# Patient Record
Sex: Male | Born: 1950 | Race: White | Hispanic: No | Marital: Married | Smoking: Former smoker
Health system: Southern US, Community
[De-identification: ages and names within clinical notes are randomized; demographics above are authoritative.]

## PROBLEM LIST (undated history)

## (undated) DIAGNOSIS — I4892 Unspecified atrial flutter: Secondary | ICD-10-CM

## (undated) DIAGNOSIS — M353 Polymyalgia rheumatica: Secondary | ICD-10-CM

## (undated) DIAGNOSIS — J189 Pneumonia, unspecified organism: Secondary | ICD-10-CM

## (undated) DIAGNOSIS — I219 Acute myocardial infarction, unspecified: Secondary | ICD-10-CM

## (undated) DIAGNOSIS — E7800 Pure hypercholesterolemia, unspecified: Secondary | ICD-10-CM

## (undated) DIAGNOSIS — I2510 Atherosclerotic heart disease of native coronary artery without angina pectoris: Secondary | ICD-10-CM

## (undated) DIAGNOSIS — G4700 Insomnia, unspecified: Secondary | ICD-10-CM

## (undated) DIAGNOSIS — R1903 Right lower quadrant abdominal swelling, mass and lump: Secondary | ICD-10-CM

## (undated) DIAGNOSIS — Z720 Tobacco use: Secondary | ICD-10-CM

## (undated) DIAGNOSIS — B2710 Cytomegaloviral mononucleosis without complications: Secondary | ICD-10-CM

## (undated) DIAGNOSIS — I714 Abdominal aortic aneurysm, without rupture: Secondary | ICD-10-CM

## (undated) DIAGNOSIS — B259 Cytomegaloviral disease, unspecified: Secondary | ICD-10-CM

## (undated) DIAGNOSIS — I10 Essential (primary) hypertension: Secondary | ICD-10-CM

## (undated) HISTORY — DX: Insomnia, unspecified: G47.00

## (undated) HISTORY — DX: Cytomegaloviral disease, unspecified: B25.9

## (undated) HISTORY — PX: TRIGGER FINGER RELEASE: SHX641

## (undated) HISTORY — DX: Pure hypercholesterolemia, unspecified: E78.00

## (undated) HISTORY — DX: Tobacco use: Z72.0

## (undated) HISTORY — DX: Polymyalgia rheumatica: M35.3

## (undated) HISTORY — DX: Atherosclerotic heart disease of native coronary artery without angina pectoris: I25.10

---

## 1998-06-01 ENCOUNTER — Other Ambulatory Visit: Admission: RE | Admit: 1998-06-01 | Discharge: 1998-06-01 | Payer: Self-pay | Admitting: Urology

## 1999-03-05 DIAGNOSIS — I2510 Atherosclerotic heart disease of native coronary artery without angina pectoris: Secondary | ICD-10-CM

## 1999-03-05 HISTORY — DX: Atherosclerotic heart disease of native coronary artery without angina pectoris: I25.10

## 1999-03-15 ENCOUNTER — Encounter: Payer: Self-pay | Admitting: Emergency Medicine

## 1999-03-15 ENCOUNTER — Inpatient Hospital Stay (HOSPITAL_COMMUNITY): Admission: EM | Admit: 1999-03-15 | Discharge: 1999-03-23 | Payer: Self-pay | Admitting: Emergency Medicine

## 1999-03-16 ENCOUNTER — Encounter: Payer: Self-pay | Admitting: Cardiology

## 1999-03-16 HISTORY — PX: CARDIAC CATHETERIZATION: SHX172

## 1999-03-18 ENCOUNTER — Encounter: Payer: Self-pay | Admitting: Cardiology

## 1999-03-19 ENCOUNTER — Encounter: Payer: Self-pay | Admitting: Thoracic Surgery (Cardiothoracic Vascular Surgery)

## 1999-03-19 HISTORY — PX: CORONARY ARTERY BYPASS GRAFT: SHX141

## 1999-03-20 ENCOUNTER — Encounter: Payer: Self-pay | Admitting: Thoracic Surgery (Cardiothoracic Vascular Surgery)

## 1999-03-21 ENCOUNTER — Encounter: Payer: Self-pay | Admitting: Thoracic Surgery (Cardiothoracic Vascular Surgery)

## 1999-04-10 ENCOUNTER — Encounter (HOSPITAL_COMMUNITY): Admission: RE | Admit: 1999-04-10 | Discharge: 1999-07-09 | Payer: Self-pay | Admitting: Cardiology

## 1999-07-10 ENCOUNTER — Encounter (HOSPITAL_COMMUNITY): Admission: RE | Admit: 1999-07-10 | Discharge: 1999-10-08 | Payer: Self-pay | Admitting: Cardiology

## 1999-12-31 ENCOUNTER — Emergency Department (HOSPITAL_COMMUNITY): Admission: EM | Admit: 1999-12-31 | Discharge: 1999-12-31 | Payer: Self-pay | Admitting: Emergency Medicine

## 2000-10-14 ENCOUNTER — Encounter: Payer: Self-pay | Admitting: Cardiology

## 2000-10-14 ENCOUNTER — Ambulatory Visit (HOSPITAL_COMMUNITY): Admission: RE | Admit: 2000-10-14 | Discharge: 2000-10-14 | Payer: Self-pay | Admitting: Cardiology

## 2003-03-30 ENCOUNTER — Ambulatory Visit (HOSPITAL_BASED_OUTPATIENT_CLINIC_OR_DEPARTMENT_OTHER): Admission: RE | Admit: 2003-03-30 | Discharge: 2003-03-30 | Payer: Self-pay | Admitting: *Deleted

## 2003-03-30 ENCOUNTER — Ambulatory Visit (HOSPITAL_COMMUNITY): Admission: RE | Admit: 2003-03-30 | Discharge: 2003-03-30 | Payer: Self-pay | Admitting: *Deleted

## 2006-02-20 ENCOUNTER — Inpatient Hospital Stay (HOSPITAL_COMMUNITY): Admission: AD | Admit: 2006-02-20 | Discharge: 2006-02-28 | Payer: Self-pay | Admitting: Family Medicine

## 2007-12-07 ENCOUNTER — Ambulatory Visit: Payer: Self-pay | Admitting: Gastroenterology

## 2007-12-14 ENCOUNTER — Telehealth: Payer: Self-pay | Admitting: Gastroenterology

## 2007-12-18 ENCOUNTER — Ambulatory Visit: Payer: Self-pay | Admitting: Gastroenterology

## 2009-02-03 ENCOUNTER — Encounter: Admission: RE | Admit: 2009-02-03 | Discharge: 2009-02-03 | Payer: Self-pay | Admitting: Rheumatology

## 2010-03-17 ENCOUNTER — Ambulatory Visit (HOSPITAL_BASED_OUTPATIENT_CLINIC_OR_DEPARTMENT_OTHER)
Admission: RE | Admit: 2010-03-17 | Discharge: 2010-03-17 | Payer: Self-pay | Source: Home / Self Care | Attending: Family Medicine | Admitting: Family Medicine

## 2010-04-20 ENCOUNTER — Ambulatory Visit (INDEPENDENT_AMBULATORY_CARE_PROVIDER_SITE_OTHER): Payer: BC Managed Care – PPO | Admitting: Cardiology

## 2010-04-20 DIAGNOSIS — I2510 Atherosclerotic heart disease of native coronary artery without angina pectoris: Secondary | ICD-10-CM

## 2010-04-20 DIAGNOSIS — E7800 Pure hypercholesterolemia, unspecified: Secondary | ICD-10-CM

## 2010-05-01 ENCOUNTER — Telehealth (INDEPENDENT_AMBULATORY_CARE_PROVIDER_SITE_OTHER): Payer: Self-pay | Admitting: *Deleted

## 2010-05-02 ENCOUNTER — Encounter: Payer: Self-pay | Admitting: Cardiology

## 2010-05-02 ENCOUNTER — Ambulatory Visit (HOSPITAL_COMMUNITY): Payer: BC Managed Care – PPO | Attending: Cardiology

## 2010-05-02 DIAGNOSIS — I2510 Atherosclerotic heart disease of native coronary artery without angina pectoris: Secondary | ICD-10-CM

## 2010-05-02 DIAGNOSIS — Z951 Presence of aortocoronary bypass graft: Secondary | ICD-10-CM | POA: Insufficient documentation

## 2010-05-02 DIAGNOSIS — R0609 Other forms of dyspnea: Secondary | ICD-10-CM

## 2010-05-02 DIAGNOSIS — I4949 Other premature depolarization: Secondary | ICD-10-CM

## 2010-05-02 DIAGNOSIS — Z0389 Encounter for observation for other suspected diseases and conditions ruled out: Secondary | ICD-10-CM | POA: Insufficient documentation

## 2010-05-10 NOTE — Progress Notes (Signed)
Summary: Nuclear Pre-Procedure  Phone Note Outgoing Call   Call placed by: Allen Kell Summary of Call: Reviewed information on [employer] Information Sheet (see scanned document for further details).  Spoke with patient.     Nuclear Med Background Indications for Stress Test: Evaluation for Ischemia, Graft Patency   History: CABG  History Comments: '01 CABG x5     Nuclear Pre-Procedure Cardiac Risk Factors: Lipids  Nuclear Med Study Referring MD:  P.Jordan

## 2010-05-10 NOTE — Assessment & Plan Note (Signed)
Summary: Cardiology Nuclear Testing  Nuclear Med Background Indications for Stress Test: Evaluation for Ischemia, Graft Patency   History: CABG  History Comments: '01 Cath> CABG x5. '08 [id number] reversible defect inferior wall with scar, mild ischemia,EF=64%.  Symptoms: Chest Tightness, Rapid HR  Symptoms Comments: Last couple weeks ago.   Nuclear Pre-Procedure Cardiac Risk Factors: History of Smoking, Lipids Caffeine/Decaff Intake: None NPO After: 8:00 PM Lungs: Clear IV 0.9% NS with Angio Cath: 18g     IV Site: R Antecubital IV Started by: Stanton Kidney, EMT-P Chest Size (in) 48     Height (in): 71 Weight (lb): 231 BMI: 32.33  Nuclear Med Study 1 or 2 day study:  1 day     Stress Test Type:  Stress Reading MD:  Marca Ancona, MD     Referring MD:  P.Jordan Resting Radionuclide:  Technetium 85m Tetrofosmin     Resting Radionuclide Dose:  10.0 mCi  Stress Radionuclide:  Technetium 91m Tetrofosmin     Stress Radionuclide Dose:  33.0 mCi   Stress Protocol Exercise Time (min):  10:46 min     Max HR:  144 bpm     Predicted Max HR:  159 bpm  Max Systolic BP: 196 mm Hg     Percent Max HR:  90.57 %     METS: 12.3 Rate Pressure Product:  81191    Stress Test Technologist:  Irean Hong,  RN     Nuclear Technologist:  Domenic Polite, CNMT  Rest Procedure  Myocardial perfusion imaging was performed at rest 45 minutes following the intravenous administration of Technetium 27m Tetrofosmin.  Stress Procedure  The patient exercised for 10 minutes and 46 seconds,RPE=17.   The patient stopped due to DOE, bilateral thigh tightness 7/10,  and denied any chest pain.  There were  significant ST-T wave changes, occ. PVC's/PAC's. Technetium 8m Tetrofosmin was injected at peak exercise and myocardial perfusion imaging was performed after a brief delay.  QPS Raw Data Images:  Normal; no motion artifact; normal heart/lung ratio. Stress Images:  Basal to mid inferior perfusion defect.    Rest Images:  Basal to mid inferior perfusion defect, less marked than stress Subtraction (SDS):  Partially reversible basal to mid inferior perfusion defect.  Transient Ischemic Dilatation:  1.0  (Normal <1.22)  Lung/Heart Ratio:  0.36  (Normal <0.45)  Quantitative Gated Spect Images QGS EDV:  115 ml QGS ESV:  45 ml QGS EF:  61 % QGS cine images:  Normal wall motion.    Overall Impression  Exercise Capacity: Good exercise capacity. BP Response: Normal blood pressure response. Clinical Symptoms: Shortness of breath.  ECG Impression: 1 mm ST depression inferiorly and in V4-V6 at peak exercise Overall Impression: Partially reversible basal to mid inferior perfusion defect suggests infarction with peri-infarct ischemia.  Ischemic ECG response.   Appended Document: Cardiology Nuclear Testing COPY SENT DR. Sw[place]iland

## 2010-07-20 NOTE — Discharge Summary (Signed)
[name], [name]              ACCOUNT NO.:  [id number]   MEDICAL RECORD NO.:  [id number]          PATIENT TYPE:  INP   LOCATION:  3021                         FACILITY:  [facility]   PHYSICIAN:  Corinna L. Lendell Caprice, MDDATE OF BIRTH:  01-Feb-1951   DATE OF ADMISSION:  02/20/2006  DATE OF DISCHARGE:  02/28/2006                               DISCHARGE SUMMARY   DISCHARGE DIAGNOSES:  1. 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  2. Coronary artery disease with a history of coronary artery bypass      graft.  3. Hyperlipidemia.  4. Hypertension.   DISCHARGE MEDICATIONS:  He is to take:  1. Prednisone 30 mg p.o. daily until seen by rheumatology.  2. Voltaren 50 mg p.o. b.i.d. as needed for pain.  He may continue his outpatient medications which include:  1. Lisinopril 40 mg a day.  2. Aspirin 325 mg a day.  3. Multivitamin a day.   CONDITION:  Stable.   ACTIVITY:  Ad lib.   DIET:  Cardiac.   CONSULTATIONS:  None.   PROCEDURES:  None.   PERTINENT LABORATORIES:  White blood cell count on admission was 12.  The rest of his CBC was unremarkable at discharge is 8.5.  Complete  metabolic panel significant for a sodium of 133, glucose 127, albumin  3.2, total protein 5.9, otherwise unremarkable.  Total CPK was normal.  TSH 1.5.  C-reactive protein slightly elevated at 3.6.  UA:  Negative  nitrite, negative leukocyte esterase, negative blood, negative protein.  Urine culture:  Negative.  Rheumatoid factor less than 20.  Lyme  antibody is negative.  Aldolase normal at 5.3.  ANA negative.   SPECIAL STUDIES IN RADIOLOGY:  Doppler of the left lower extremity  showed no DVT.  Chest X-Ray: Showed left basilar subsegmental  atelectasis or scar.  Bilateral hip films and pelvis were negative.  Shoulder x-ray was negative.  MRI of the brain negative.  MRI of the C-  spine showed no cord compression.  EKG showed normal sinus rhythm.   HISTORY AND HOSPITAL COURSE:  Mr.  William [name] is a 60 year old white male  patient of Dr. Marinda Elk, who was directly admitted with a several  month history of worsening shoulder and hip/groin pain,.  He has a  history of hyperlipidemia and had been on Zetia and Zocor.  These were  discontinued several months ago when he started having myalgias but his  symptoms continued to worsen and the pain started in his shoulders and  axilla and some spread eventually down to his hips and groin.  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  He also had difficulty driving and walking.  He had been on Vicodin and  ibuprofen without relief.  As he was having difficulties with activities  of daily living, he was admitted for further workup, treatment, and  physical therapy.  There is no family history of any rheumatologic  disorders.  He had no weakness on exam, but he did have limited range of  motion of his shoulders due  to pain and also had pain with external  rotation of the hips.  He had essentially a negative workup except for a  slightly elevated white count and C-reactive protein.  His erythrocyte  sedimentation rate, I failed to mention above, was normal at 13.   The patient was started briefly on doxycycline as he had reported going  can be recently.  He denied any rash, however.  When his Lyme titers  came back, this was stopped.  He was requiring quite a bit of IV  Dilaudid.  After discussing the case with Dr. Estill Bakes, the patient was  started on prednisone 30 mg as recommended by Dr. Phylliss Bob.  Dr. Phylliss Bob also  recommended an MRI of the C-spine and head which was negative.  The  patient has a normal erythrocyte sedimentation rate but this could  possibly be polymyalgia rheumatic, according to Dr. Phylliss Bob.   The patient was completely pain free within 24 hours of starting the  steroids, also Voltaren was started at the same time.  However, the  patient had been on high doses of ibuprofen as an  outpatient without  relief, so I suspect the prednisone to have relieved his pain more than  the nonsteroidal anti-inflammatories.   The patient is being discharged to home in stable condition.   He has an outpatient follow-up appointment with Dr. Foy Guadalajara and I have  asked him to follow up with rheumatology first available appointment      Corinna L. Lendell Caprice, MD  Electronically Signed     CLS/MEDQ  D:  02/28/2006  T:  02/28/2006  Job:  [id number]   cc:   Molly Maduro L. Foy Guadalajara, M.D.  Areatha Keas, M.D.

## 2010-07-20 NOTE — Discharge Summary (Signed)
[place]. [facility]  Patient:    William [name]                        MRN: [id number] Adm. Date:  [id number] Disc. Date: 03/23/99 Attending:  Charlett Lango Dictator:   Eugenia Pancoast, P.A. CC:         Salvatore Decent. Dorris Fetch, M.D.             Peter M. [place], M.D.             Marinda Elk, M.D., [facility]                           Discharge Summary  DATE OF BIRTH:  1950-03-09  FINAL DIAGNOSES: 1. Coronary artery disease. 2. Hypertension. 3. Hypercholesterolemia.  PROCEDURES: 1. March 16, 1999, left heart catheterization by Dr. Peter Swaziland. 2. March 19, 1999, coronary artery bypass graft of LIMA to the LAD,    RIMA to OM-2, sequential saphenous vein graft to OM-1 and OM-3, and    saphenous vein graft to intermediate, surgeon was Dr. Dorris Fetch.  HISTORY:  This is a 60 year old gentleman with a history of borderline hypertension, hypercholesterolemia, and tobacco abuse who presents for evaluation and testing.  On the night of admission, March 15, 1999, he was getting ready for bed when he developed substernal chest pain.  There was no radiation of the pain. There was increasing intensity to 8/10 associated with some sweating but no shortness of breath.  He took Rolaids without relief.  After approximately one nd one-half hours, he presented to the emergency room, and his pain had abated to approximately 1 to 2/10.  He took nitroglycerin but states he really did not notice any difference with this.  He had no prior history of chest pain, no history of  myocardial infarction or congestive heart failure.  Because of these symptoms, though, he was admitted to Dr. Arther Abbott service for further evaluation.  HOSPITAL COURSE:  The patient was admitted March 15, 1999, and cardiac enzymes were elevated.  The patient was started on Lovenox per pharmacy protocol.  He continued on that until March 16, 1999, when he underwent his  left heart catheterization per Dr. Peter Swaziland.  Dr. Swaziland performed a catheterization which the patient tolerated satisfactorily.  He was noted to have severe three-vessel  coronary artery disease and subsequently was referred to Dr. Charlett Lango for surgical revascularization.  Dr. Dorris Fetch saw the patient and spoke with him and discussed the surgery.  Risks and benefits were explained.  The patient understood and agreed to surgery.  On March 19, 1999, the patient underwent coronary artery bypass x 5 with LIMA to the LAD, RIMA to the second obtuse marginal, saphenous ein graft to the first and third obtuse marginals, and saphenous vein graft to the intermediate artery.  The patient tolerated the procedure well, and no intraoperative complications occurred.  Postoperatively, the patient has done quite well.  He was extubated on the operative day.  He progressed in a satisfactory manner.  He was afebrile. Vital signs were all stable.  He was transferred to a stepdown unit on the first postoperative day.  There, he continued to progress satisfactorily.  He went through his cardiac rehabilitation phase 1 without difficulty.  He continued to  progress in a satisfactory manner.  No untoward events occurred during his stay. He continue to progress well.  His diet  was advanced.  He was tolerating his diet satisfactorily with not complaints there.  He did so well that by the third postoperative day, he was prepared for discharge.  Final laboratory work showed sodium 139, potassium 4.4, chloride 105, CO2 32, BUN 19, creatinine 1.0, glucose 116, calcium 8.3.  White blood cell count 9.9, hemoglobin 9.1, hematocrit 25.9, platelets 172,000.  The patient was doing well as noted.  His incisions were all healing satisfactorily.  No sign of infection occurred at this time.  He did have some difficulty with his O2 saturations requiring O2 longer than normal.  But, by the third  postoperative day, he was in his 90s without O2 support.  DISCHARGE MEDICATIONS: 1. Zocor 20 mg q.d. 2. Lasix 40 mg q.d. x 7 days. 3. K-Dur 20 mEq q.d. x 7 days. 4. Enteric-coated aspiring 325 mg q.d. 5. Lopressor 50 mg 1/2 tablet q.12h. 6. Tylox 1 to 2 p.o. q.4-6h. p.r.n. pain.  FOLLOWUP:  The patient will follow up with Dr. Peter Swaziland in two weeks.  At that time, he should also get a chest x-ray.  He will come to Dr. Rondel Jumbo office on Wednesday, February 7 at 10 a.m. for followup.  At that time, he will bring he x-ray with him.  DISPOSITION:  The patient is discharged home in satisfactory and stable condition on March 23, 1999.  DIET:  Low-fat, heart smart diet.  ACTIVITY:   He is advised not to drive.  No heavy lifting for the next three weeks, and walk daily. DD:  03/22/99 TD:  03/23/99 Job: [id number]

## 2010-07-20 NOTE — Cardiovascular Report (Signed)
[place]. [facility]  Patient:    William [name]                        MRN: [id number] Proc. Date: 03/16/99 Adm. Date:  [id number] Attending:  [place], Peter Manning CC:         Marinda Elk, M.D., [facility] B. Tyrone Sage, M.D.                        Cardiac Catheterization  INDICATIONS FOR PROCEDURE:  The patient is a 60 year old white male who presented with unstable angina pectoris.  The patient had a marked ischemic response on exercise stress testing with ST elevation in the anterior septal leads.  The patient has a history of tobacco abuse and hypercholesterolemia.   PROCEDURES:  Left heart catheterization with coronary angiography.  ACCESS:  Via the right femoral artery using the standard Seldinger technique.  EQUIPMENT:  6 French 4 cm right and left Judkins catheter, 6 French pigtail catheter, 6 French arterial sheath.  MEDICATIONS:  Omnipaque 120 cc.  HEMODYNAMIC DATA:  Aortic pressure is 107/69 with a mean of 86 mmHg.  Left ventricular pressure is 138 with an EDP of 16 mmHg.  COMMENTARY:  After the patients cardiac catheterization, he developed diffuse hives and itching.  He was treated with IV Benadryl and Solu-Medrol. Hemodynamically he remained stable.  ANGIOGRAPHIC DATA:  Left coronary artery:  The left coronary artery arises and distributes normally.  Left main:  The left main coronary artery is normal.  Left anterior descending:  The left anterior descending artery is a large vessel which wraps around the apex.  It is diffusely diseased.  There is segmental 50%  stenosis proximally followed by segmental 50% disease in the mid vessel.  In the distal vessel there is a more focal 60-70% stenosis.  Left circumflex:  The left circumflex coronary artery arises and distributes normally.  There are two large obtuse marginal vessels which arise from the central trunk.  This trunk has severe disease up  to 80-90%.  This involves bifurcation f two marginal vessels.  Right coronary artery:  The right coronary artery arises normally.  There is a 9% focal stenosis in the proximal vessel.  The right coronary artery is then occluded in the mid vessel.  There are some bridging right to right collaterals and left to right collaterals as well.  However, there appears to be a large skip area between the occlusion and the collaterals filling in.  LEFT VENTRICULAR ANGIOGRAPHY:  The left ventricular angiography demonstrates normal left ventricular size and contractility with normal systolic function. Ejection fraction is calculated at 65%.  There is no mitral regurgitation or prolapse.  FINAL INTERPRETATION: 1. Three-vessel obstructive atherosclerotic coronary artery disease. 2. Normal left ventricular function.  PLAN:  Based on this patients anatomy, he is poorly suited for catheter base intervention.  Would recommend coronary artery bypass surgery. DD:  03/16/99 TD:  03/17/99 Job: [id number]

## 2010-07-20 NOTE — Op Note (Signed)
William [name], William [name]                          ACCOUNT NO.:  [id number]   MEDICAL RECORD NO.:  [id number]                   PATIENT TYPE:  AMB   LOCATION:  DSC                                  FACILITY:  [facility]   PHYSICIAN:  Lowell Bouton, M.D.      DATE OF BIRTH:  24-Feb-1951   DATE OF PROCEDURE:  03/30/2003  DATE OF DISCHARGE:                                 OPERATIVE REPORT   PREOPERATIVE DIAGNOSIS:  Right ring trigger finger.   POSTOPERATIVE DIAGNOSIS:  Right ring trigger finger.   OPERATION PERFORMED:  Release of A-1 pulley, right ring finger.   SURGEON:  Lowell Bouton, M.D.   ANESTHESIA:  0.5% Marcaine local with sedation.   OPERATIVE FINDINGS:  The patient had a volar retinacular ganglion attached  to the A-1 pulley.  There was thickening of the tendon beneath the pulley.   DESCRIPTION OF PROCEDURE:  Under 0.5% Marcaine local anesthesia with the  tourniquet on the right arm, the right hand was prepped and draped in the  usual fashion.  After exsanguinating the limb, the tourniquet was inflated  to 250 mmHg.  A V-shaped incision was made, crossing the distal palmar  crease in line with the ring finger.  Sharp dissection was carried through  the subcutaneous tissues.  Blunt dissection was carried down to the A-1  pulley and there was a ganglion attached to it that was excised.  The A-1  pulley was then incised and the finger was placed through full range of  motion.  There was enlargement of the flexor tendon beneath the A-1 pulley.  There was no further triggering after release of the pulley.  The wound was  then irrigated.  The skin was closed with 4-0 nylon suture.  Sterile  dressings were applied.  The patient tolerated the procedure well and went  to the recovery room awake and stable in  good condition.                                               Lowell Bouton, M.D.    EMM/MEDQ  D:  03/31/2003  T:  03/31/2003  Job:  [id number]

## 2010-07-20 NOTE — H&P (Signed)
[place]. [facility]  Patient:    William [name]                        MRN: [id number] Adm. Date:  [id number] Attending:  [place], Peter Manning Dictator:   Peter M. [place]il[place], M.D.                         History and Physical  CHIEF COMPLAINT:  Chest pain.  HISTORY OF PRESENT ILLNESS:  William [name] is a 60 year old white male with a history of borderline hypertension, hypercholesterolemia, and tobacco abuse, who presents for evaluation of chest pain.  Tonight the patient was getting ready for bed when he developed substernal chest pain in his mid chest.  There was no radiation of  pain.  It increased in intensity to eight out of ten and was associated with some sweating, but no shortness of breath.  He took Rolaids without relief and after  approximately 1-1/2 hours he presented to the emergency room, and his pain has ow abated to a one to two out of ten.  He took a nitroglycerin but states he really did not notice any difference with this.  He has had no prior history of chest pain, no history of myocardial infarction or congestive heart failure.  PAST MEDICAL HISTORY:  Includes history of borderline hypertension, which he relates to overeating, and has been controlled with diet.  He has a history of hypercholesterolemia in the 260s.  He is status post knee surgery for a torn meniscus.  He is status post hand surgery for trigger finger on the left.  He had evaluation one year ago for hematuria which was negative.  ALLERGIES:  No known drug allergies.  MEDICATIONS:  P.r.n. Motrin.  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  His wife is a Orthoptist.  He has smoked what he reports as less than one pack per day and quit March 05, 1999.  He denies any history of alcohol use.  FAMILY HISTORY:  Father died at age 43 with congestive heart failure.   Mother is age 69, living with him, and has had a previous CVA and hypertension.  He has one brother who has hypertension and sleep apnea.  REVIEW OF SYSTEMS:  No known history of peptic ulcer disease or acid reflux. No history of arthritis.  No history of CVA or diabetes.  PHYSICAL EXAMINATION:  GENERAL:  The patient is a well-developed white male in no apparent distress.  VITAL SIGNS:  Blood pressure 124/76, pulse 60 and regular, temperature 99 degrees, respirations 20.  HEENT:  PERRLA.  Left conjunctiva slightly injected.  Funduscopic exam benign.  Oropharynx clear.  NECK:  Supple without JVD, adenopathy, thyromegaly, or bruits.  LUNGS:  Clear to auscultation and percussion.  CARDIAC:  Regular rate and rhythm; normal S1 and S2 without murmurs, rubs, gallops, or clicks.  No chest wall tenderness to palpation.  ABDOMEN:  Soft, nontender, without masses or hepatosplenomegaly.  EXTREMITIES:  Pulses 2+ and symmetric.  No edema.  NEUROLOGIC:  Intact.  LABORATORY DATA:  CBC normal.  ECG shows normal sinus rhythm, normal ECG. Chest x-ray shows no active disease.  Other labs are pending.  IMPRESSION: 1.  Chest pain consistent with unstable angina pectoris.  Possible GI source of     pain.  2.  Hypercholesterolemia. 3.  Tobacco abuse. 4.  History of borderline hypertension, diet controlled.  PLAN:  The patient is being admitted to telemetry for rule out myocardial infarction, serial cardiac enzymes and ECG.  Will treat with aspirin, subQ Lovenox, and IV nitroglycerin.  Beta-blocker will be held due to the patients slow pulse  rate.  If his cardiac enzymes and ECG remain normal will consider stress Cardiolite evaluation tomorrow.  Will also treat with empiric Prevacid therapy. DD:  03/16/99 TD:  03/16/99 Job: [id number]

## 2010-07-20 NOTE — Op Note (Signed)
[facility]  Patient:    William [name]                        MRN: [id number] Proc. Date: 03/19/99 Adm. Date:  [id number] Attending:  Charlett Lango CC:         Peter M. [place], M.D.                           Operative Report  PREOPERATIVE DIAGNOSIS:  Unstable angina and three vessel coronary disease.  POSTOPERATIVE DIAGNOSIS:  Unstable angina and three vessel coronary disease.  PROCEDURE:  Mediasternotomy, extracorporeal circulation coronary artery bypass grafting x 5 (left internal mammary to left anterior descending, free right internal mammary artery to obtuse marginal 2, sequential saphenous vein graft to obtuse marginal 1 and obtuse marginal 3, saphenous vein graft to acute marginal).  SURGEON:  Salvatore Decent. Dorris Fetch, M.D.  ASSISTANT:  Sherrie George, P.A.  ANESTHESIA:  General.  FINDINGS:  OM2 originated epicardial, crossed under OM3, took anterolateral course intramyocardially and grafted in intramyocardial site.  Acute marginal and LAD  fair quality targets; remaining vessels - good quality targets, good quality conduits.  Aorta fragile.  CLINICAL NOTE:  Mr. Era Skeen is a 60 year old gentleman who presented with new onset unstable angina; enzymes were drawn and CK MBs were consistent with a small nonQ-wave MI.  Troponins were negative.  The patient underwent coronary artery catheterization, which revealed severe three-vessel coronary disease with total occlusion of the right coronary.  The patient was referred for coronary artery bypass grafting.  The indications, risks and benefits of the procedure, as well as alternative treatments - were discussed in detail with the patient and his family; they understood the risks and benefits and agreed to proceed.  OPERATIVE NOTE:  Mr. Warmuth was brought to the preop holding area on 03/19/99.  Lines were placed to monitor arterial, central venous and pulmonary  arterial pressure.  EKG leads were placed for continuous telemetry.  The patient was taken to the operating room, anesthetized and intubated.  A Foley catheter was placed, intravenous antibiotics were administered and the chest, abdomen and legs were prepped and draped in the usual fashion.  An median sternotomy was performed and the left internal mammary artery was harvested in the standard fashion.  Simultaneously, an incision was made in the  medial aspect of the right leg, at the ankle and the greater saphenous vein was  harvested from the ankle to the upper calf.  The saphenous vein was of good quality as was the left internal mammary artery.  The patient was given 5000 units heparin prior to finding the distal end of the mammary artery.  There was good flow through the cut into the vessel; the mammary was placed in a papaverine-soaked sponge. All branches were clipped.  The mammary was then placed into the left pleural space. The right internal mammary artery then was harvested in the same fashion.  The patient was given the remainder of the full heparinization dose prior to dividing the distal end of the right mammary artery.  Again, there was good flow through the cut into this vessel, and the mammary was placed in a papaverine-soaked sponge nd placed into the right pleural space.  The pericardium was opened and the ascending aorta was palpated.  The aorta was  without palpable atherosclerotic disease.  The aorta was cannulated via concentric 2-Ethibond nonpledgeted pursestring sutures.  A dual-stage  venous cannula was placed via pursestring suture in the right atrial appendage.  Cardiopulmonary bypass was instituted and the patient was cooled to 32 degrees Celsius.  The coronary arteries were inspected and anastomotic sites chosen.  The conduits were inspected and cut to length.  A foam pad was placed in the pericardium to protect the left phrenic nerve.  A  temperature probe was placed in the myocardial septum and a cardioplegic cannula was placed in the ascending aorta.  Of note:  The acute marginal was a graftable vessel off the right coronary system.  There was a very small posterior descending, which was diffusely diseased and nongraftable.  The aorta was crossclamped.  The left ventricle was emptied ______ aortic root.  Cardiac arrest was achieved with a combination of cold antegrade cardioplegia and topical iced saline.  After achieving a complete diastolic arrest, the myocardial went to temperature of 11 degrees Celsius. The following distal anastomoses were performed.  First, a reverse saphenous vein graft was placed end-to-side to the acute marginal branch of the right coronary artery; this was a 1.5 mm fair quality target. There was atherosclerotic disease just proximal to the anastomosis.  The vessel bifurcated to a 1 mm and 1.5 mm branch just after the anastomosis.  The anastomosis was performed with a running 7-0 Prolene suture, proximally and distally, prior to tying the suture.  There was good flow through the graft.  Cardioplegia was administered and there were no leaks.  Next, a reverse saphenous vein graft was placed sequentially to OM1 and OM3; the first obtuse marginal branch came off very high in distribution of a ramus intermedius.  This was a 1.5 mm good quality vessel that had 80% proximal blockage. The anastomosis was performed side-to-side off a side branch of the vein graft,  using a running 7-0 Prolene suture.  An end-to-side anastomosis then was constructed to the third obtuse marginal branch, which is a 1.5 mm good quality  vessel.  Both anastomoses were probed proximally and distally with 1.5 mm probes prior to tying the sutures.  There was good flow through the graft.  Additional  cardioplegia was administered down the vein graft and there was good hemostasis at each of the  anastomoses.  Next, the free right internal mammary graft was spatulated on its distal end; it was a 1.5 mm vessel - at that side, was 2 mm proximally.  It was anastomosed end-to-side to the second obtuse marginal branch of the left circumflex coronary  artery.  This vessel actually came off epicardial than dove intramyocardial to traverse underneath the OM3 and came out on a more anterolateral course and traversed all the way to the apex.  It was grafted to the intramyocardial site nd was a 2 mm good quality vessel and had 80% stenosis at it origin.  The free right internal mammary artery was anastomosed end-to-side with a running 8-0 Prolene suture and the anastomosis was probed proximally and distally with a 1.5 mm probe, which passed easily, before tying the suture.  Additional cardioplegia was administered; there was back-bleeding of cardioplegia from the second obtuse marginal, free RIMA graft. A soft bulldog clamp was placed to cross.  The left internal mammary artery then was brought through a window in the pericardium anterior to the left phrenic nerve; it was anastomosed end-to-side to the distal LAD.  The distal LAD was a 2 mm fair quality vessel; there was atherosclerosis but not proximal and distal to the site of the anastomosis that  was a diffusely diseased vessel, although a 1.5 mm probe passed easily proximally and distally.  The anastomosis was performed with a running 8-0 Prolene suture.  At  completion of the mammary to LAD anastomosis, the bulldog clamps were removed from the mammary artery and immediate and rapid septal rewarming was noted. Lidocaine was administered and the mammary pedicle was tacked to the epicardial surface of the heart with interrupted 6-0 Prolene sutures.  The aortic crossclamp was removed and total crossclamp time was 57 minutes.  A total of three defibrillations with 20 joules was required before the patient  resumed sinus  rhythm.  He then had no further arrhythmias.  A partial occlusion  clamp was placed on the ascending aorta.  The cardioplegia cannula was removed.  The proximal vein grafts/anastomoses were performed to 4.0 mm punch aortotomies  with running 6-0 Prolene sutures.  A venotomy then was made in the hood of the sequential obtuse marginal vein graft at the site of aortotomy and the free right internal mammary artery proximal anastomosis was performed to this venotomy with a running 7-0 Prolene suture.  The bulldog clamp was removed from the mammary artery.  There was back-bleeding into the anastomosis, which was used to de-air prior to  removing the partial occlusion clamp.  A suture then was tied and air was aspirated from the vein grafts.  All bulldog clamps were removed and flow was restored. ll proximal and distal anastomoses were inspected for hemostasis.  There was significant bleeding from the ascending aorta, from the site of sutures pulling  through relatively fragile arterial wall.  At one site there was a linear tear sufficient to require a 4-0 pledgeted suture to control the bleeding. Epicardial pacing wires were placed on the right ventricle and right atrium.  The patient as rewarmed.  The patient was weaned from cardiopulmonary bypass.  When the core temperature reached 37 degrees Celsius, the patient in sinus rhythm, but was atrially paced for rate at the time of separation from bypass.  He was on no inotropic support until bypass time was 131 minutes.  The patient remained hemo- dynamically stable throughout the post-bypass course.  A test dose of protamine was administered and was well-tolerated.  The atrial and aortic cannula were removed.  The remainder of the protamine was administered. A 3-0 Prolene pledget horizontal mattress suture was required to reinforce the aortic cannulation site; there was good hemostasis.  A 3-0 non-pledgeted Prolene suture was used  to reinforce the atrial cannulation site.  Hemostasis was achieved, and the chest was irrigated with one L of warm normal saline containing 1 gram of vancomycin.  The pericardium cannot be reapproximated, but the mediastinal fat as brought together over the heart and vein grafts, and ascending aorta to prevent  adherence to the sternum.  Bilateral pleural chest tubes were placed as well as two mediastinal chest tubes.  The sternum was closed with stainless steel wires. The pectoralis fascia was closed with running #1 Vicryl suture.  The subcutaneous tissue in both wounds was closed with a 2-0 Vicryl running suture and the skin n both wounds was closed with a running 3-0 Vicryl subcuticular suture.  All sponge, needle and instrument counts were correct at the end of the procedure. The patient remained hemodynamically stable and was taken from the operating room to the surgical intensive care unit. DD:  03/19/99 TD:  03/19/99 Job: [id number]

## 2010-07-20 NOTE — H&P (Signed)
William [name], William [name]              ACCOUNT NO.:  [id number]   MEDICAL RECORD NO.:  [id number]          PATIENT TYPE:  INP   LOCATION:  3021                         FACILITY:  [facility]   PHYSICIAN:  Kela Millin, M.D.DATE OF BIRTH:  01-13-1951   DATE OF ADMISSION:  02/20/2006  DATE OF DISCHARGE:                              HISTORY & PHYSICAL   CHIEF COMPLAINT:  Worsening arthralgias and myalgias.   HISTORY OF PRESENT ILLNESS:  The patient is a 60 year old white male  with past medical history significant for hyperlipidemia, hypertension  and coronary artery disease.  He presents with above complaints.  He  states that he was on Zetia on Zocor for hyperlipidemia, and 2 months  ago he began having myalgias, shoulder pain and also pain in his groins.  He saw his family care physician and the Zocor and subsequently Zetia  were discontinued in November.  (The patient states that he has had some  myalgias in the past while on statins, and after discontinuation of the  medications the acute symptoms resolved).  The patient states that in  the 3 weeks since discontinuation of the entire lipid medications, his  myalgias have continued to worsen; the shoulder joint pain bilaterally  as well as the groin pain bilaterally have continued to worsen.  He  states that the pain worsens with any movement if he tries to lift up  his arms, or trying to walk or even turning in bed.  The patient also  reports that he is still able to comb his hair, but with difficulty.  He  is not able to do his other activities of daily living, and his wife has  to help him to dress up.  This has gotten to where he has difficulty  walking, and by his wife's demonstration his gait is antalgic.   The patient was seen at his PCP's office today, and after doing some lab  work he was started on some Prevacid -- which he states give him the  most relief of any of the medications he has tried.  He is admitted to  the [facility] for further evaluation and management.   The patient feels that he had been on Vicodin, also tried ibuprofen with  no relief.  He has also not been able to sleep at night, secondary to  the pain.  The patient denies chest pain, cough, fevers, shortness of  breath, dysuria, diarrhea, melena, and no hematochezia.  He states that  several weeks ago he did have some cough symptoms, but those have since  resolved.   PAST MEDICAL HISTORY:  1. As stated above.  2. Coronary artery disease, status post coronary artery bypass      grafting x5 vessels.   MEDICATIONS:  1. Lisinopril 40 mg daily.  2. Aspirin 325 mg.  3. Silver Centrum.   ALLERGIES:  IV DYE.   SOCIAL HISTORY:  She quit tobacco in 2001, occasional beer.   FAMILY HISTORY:  His father had bladder cancer.  His mother died of  metastatic cancer.  He denies any family history of connective  tissue  diseases.   REVIEW OF SYSTEMS:  As the HPI -- Other review of systems negative.   PHYSICAL EXAMINATION:  GENERAL:  The patient is a middle-aged white  male. He is alert and oriented x3, in no respiratory distress.  VITAL SIGNS:  Temperature 97.2 with a pulse of 69. respiratory rate 22,  blood pressure 113/70, O2 saturations 96%.  HEENT:  PERRL.  EOMI.  Sclerae anicteric.  Moist mucous membranes.  NECK:  Supple.  No adenopathy.  No thyromegaly.  No JVD.  LUNGS:  Sounds clear to auscultation bilaterally.  No crackles and no  wheezes.  CARDIOVASCULAR: Regular rate and rhythm.  Normal S1, S2.  ABDOMEN:  Obese.  Bowel sounds present, nontender and nondistended.  No  organomegaly and no masses palpable.  EXTREMITIES:  No cyanosis and no edema.  No synovitis, and no findings  consistent with joint effusions noted.  The range of movement in his  shoulders is limited by pain.  Also the range of motion in his hips is  limited by pain.  NEUROLOGIC:  He is alert and oriented x3.  Saline 2212 grossly intact.  Gait is not  assessed secondary to pain.  His strength is 4-5/5 and  symmetric.  Sensory is grossly intact.   LABORATORY DATA:  Lab work pending.   ASSESSMENT AND PLAN:  PROBLEM #1 - ARTHRALGIAS/MYALGIAS:  I have  discussed about being progressive over the past 2 months, even off  statins.  Will obtain a sed rate rheumatoid factor.  CPK, TSH,  Aldolase, LFTs.  No analgesics for pain management.  Followup.   PROBLEM #2 - CONTINUE LISINOPRIL.   PROBLEM #3 - CORONARY ARTERY DISEASE/RECENT CHEST PAIN-FREE:  Obtained  on EKG for a CPK as above.      Kela Millin, M.D.  Electronically Signed     ACV/MEDQ  D:  02/20/2006  T:  02/20/2006  Job:  [id number]

## 2011-02-28 ENCOUNTER — Telehealth: Payer: Self-pay | Admitting: Cardiology

## 2011-02-28 MED ORDER — NITROGLYCERIN 0.4 MG SL SUBL: 0.4000 mg | SUBLINGUAL_TABLET | SUBLINGUAL | Status: DC | PRN

## 2011-02-28 NOTE — Telephone Encounter (Signed)
Called stating he had episode during the night last night of chest discomfort lasting few minutes. Took 2 NTG's but they were half tablets and was relieved. Feels fine today. Has been eating a lot over the holidays and thinks could be indigestion. His NTG expiration date is 4/12. Advised will send new RX in and if has chest pain or discomfort again needs to call us or go to ER

## 2011-02-28 NOTE — Telephone Encounter (Signed)
New Problem:     Patient called feeling uncomfortable, has some chest pain, and he took some quick nitro and everything subsided and everything is fine now. He just wanted to let you know. Please call back if necessary.

## 2011-03-29 ENCOUNTER — Encounter: Payer: Self-pay | Admitting: Cardiology

## 2011-06-10 DIAGNOSIS — M353 Polymyalgia rheumatica: Secondary | ICD-10-CM | POA: Diagnosis present

## 2011-06-10 DIAGNOSIS — R3129 Other microscopic hematuria: Secondary | ICD-10-CM | POA: Insufficient documentation

## 2011-06-10 DIAGNOSIS — I10 Essential (primary) hypertension: Secondary | ICD-10-CM | POA: Insufficient documentation

## 2011-06-10 DIAGNOSIS — E785 Hyperlipidemia, unspecified: Secondary | ICD-10-CM | POA: Diagnosis present

## 2011-06-10 DIAGNOSIS — K819 Cholecystitis, unspecified: Secondary | ICD-10-CM | POA: Insufficient documentation

## 2012-05-03 ENCOUNTER — Emergency Department (HOSPITAL_COMMUNITY)

## 2012-05-03 ENCOUNTER — Emergency Department (HOSPITAL_COMMUNITY)
Admission: EM | Admit: 2012-05-03 | Discharge: 2012-05-03 | Disposition: A | Discharge status: Home / Self Care | Attending: Emergency Medicine | Admitting: Emergency Medicine

## 2012-05-03 ENCOUNTER — Encounter (HOSPITAL_COMMUNITY): Payer: Self-pay | Admitting: Emergency Medicine

## 2012-05-03 DIAGNOSIS — Z87898 Personal history of other specified conditions: Secondary | ICD-10-CM | POA: Insufficient documentation

## 2012-05-03 DIAGNOSIS — IMO0002 Reserved for concepts with insufficient information to code with codable children: Secondary | ICD-10-CM | POA: Insufficient documentation

## 2012-05-03 DIAGNOSIS — I259 Chronic ischemic heart disease, unspecified: Secondary | ICD-10-CM | POA: Insufficient documentation

## 2012-05-03 DIAGNOSIS — R071 Chest pain on breathing: Secondary | ICD-10-CM | POA: Insufficient documentation

## 2012-05-03 DIAGNOSIS — Z79899 Other long term (current) drug therapy: Secondary | ICD-10-CM | POA: Insufficient documentation

## 2012-05-03 DIAGNOSIS — I10 Essential (primary) hypertension: Secondary | ICD-10-CM | POA: Insufficient documentation

## 2012-05-03 DIAGNOSIS — F17200 Nicotine dependence, unspecified, uncomplicated: Secondary | ICD-10-CM | POA: Insufficient documentation

## 2012-05-03 DIAGNOSIS — R0789 Other chest pain: Secondary | ICD-10-CM

## 2012-05-03 DIAGNOSIS — Z7982 Long term (current) use of aspirin: Secondary | ICD-10-CM | POA: Insufficient documentation

## 2012-05-03 DIAGNOSIS — M353 Polymyalgia rheumatica: Secondary | ICD-10-CM | POA: Insufficient documentation

## 2012-05-03 DIAGNOSIS — E7800 Pure hypercholesterolemia, unspecified: Secondary | ICD-10-CM | POA: Insufficient documentation

## 2012-05-03 DIAGNOSIS — Z951 Presence of aortocoronary bypass graft: Secondary | ICD-10-CM | POA: Insufficient documentation

## 2012-05-03 DIAGNOSIS — G4700 Insomnia, unspecified: Secondary | ICD-10-CM | POA: Insufficient documentation

## 2012-05-03 DIAGNOSIS — Z8709 Personal history of other diseases of the respiratory system: Secondary | ICD-10-CM | POA: Insufficient documentation

## 2012-05-03 HISTORY — DX: Essential (primary) hypertension: I10

## 2012-05-03 LAB — CBC WITH DIFFERENTIAL/PLATELET
Basophils Absolute: 0 10*3/uL (ref 0.0–0.1)
Basophils Relative: 0 % (ref 0–1)
Eosinophils Absolute: 0.6 10*3/uL (ref 0.0–0.7)
Eosinophils Relative: 6 % — ABNORMAL HIGH (ref 0–5)
HCT: 44.4 % (ref 39.0–52.0)
Hemoglobin: 15.7 g/dL (ref 13.0–17.0)
Lymphocytes Relative: 22 % (ref 12–46)
Lymphs Abs: 2.3 10*3/uL (ref 0.7–4.0)
MCH: 33.3 pg (ref 26.0–34.0)
MCHC: 35.4 g/dL (ref 30.0–36.0)
MCV: 94.3 fL (ref 78.0–100.0)
Monocytes Absolute: 1.1 10*3/uL — ABNORMAL HIGH (ref 0.1–1.0)
Monocytes Relative: 11 % (ref 3–12)
Neutro Abs: 6.4 10*3/uL (ref 1.7–7.7)
Neutrophils Relative %: 61 % (ref 43–77)
Platelets: 197 10*3/uL (ref 150–400)
RBC: 4.71 MIL/uL (ref 4.22–5.81)
RDW: 13.2 % (ref 11.5–15.5)
WBC: 10.5 10*3/uL (ref 4.0–10.5)

## 2012-05-03 LAB — POCT I-STAT, CHEM 8
BUN: 16 mg/dL (ref 6–23)
Chloride: 105 mEq/L (ref 96–112)
Creatinine, Ser: 0.9 mg/dL (ref 0.50–1.35)
Potassium: 4.1 mEq/L (ref 3.5–5.1)
Sodium: 139 mEq/L (ref 135–145)

## 2012-05-03 LAB — POCT I-STAT TROPONIN I: Troponin i, poc: 0.02 ng/mL (ref 0.00–0.08)

## 2012-05-03 MED ORDER — HYDROCOD POLST-CHLORPHEN POLST 10-8 MG/5ML PO LQCR: 5.0000 mL | Freq: Two times a day (BID) | ORAL | Status: DC

## 2012-05-03 NOTE — ED Notes (Signed)
Pt c/o left side abdominal pain x 2 days. Pt had bronchitis for the past couple of weeks. Pain on left side is increased and pt has shortness of breath.

## 2012-05-03 NOTE — ED Notes (Signed)
Pt c/o left rib pain. Pt sts coughing "alot" since dx of bronchitis. Pt currently on ATB's.

## 2012-05-03 NOTE — ED Provider Notes (Signed)
History    62 year old male with left-sided chest pain and cough. Gradual onset about 2 days ago. The pain is in his left chest. Worse with movements, coughing and deep breaths. Patient states he is currently being treated for bronchitis. He was placed on steroids, inhaler,  antibiotics and given something for his cough. He states the symptoms have been relatively constant since it began. No unusual leg pain or swelling. No fevers or chills. Denies  history of blood clot.   CSN: [id number]  Arrival date & time 05/03/12  0903   First MD Initiated Contact with Patient 05/03/12 1011      Chief Complaint  Patient presents with  . Abdominal Pain    (Consider location/radiation/quality/duration/timing/severity/associated sxs/prior treatment) HPI  Past Medical History  Diagnosis Date  . Coronary disease   . Hypercholesterolemia   . Polymyalgia rheumatica   . Bronchitis   . Insomnia   . Tobacco abuse   . History of chest pain   . Hypertension     Past Surgical History  Procedure Laterality Date  . Cardiac catheterization  03/16/1999    ejection fraction 65%--After surgery pt. developed hives and itching  . Coronary artery bypass graft  03/19/1999    No family history on file.  History  Substance Use Topics  . Smoking status: Current Some Day Smoker    Types: Cigars  . Smokeless tobacco: Not on file  . Alcohol Use: 0.6 oz/week    1 Cans of beer per week      Review of Systems  Allergies  Iodine and Zocor  Home Medications   Current Outpatient Rx  Name  Route  Sig  Dispense  Refill  . albuterol (PROVENTIL HFA;VENTOLIN HFA) 108 (90 BASE) MCG/ACT inhaler   Inhalation   Inhale 1-2 puffs into the lungs every 4 (four) hours as needed for wheezing or shortness of breath.         Marland Kitchen amLODipine (NORVASC) 5 MG tablet   Oral   Take 5 mg by mouth daily.         Marland Kitchen aspirin 81 MG tablet   Oral   Take 81 mg by mouth daily.         . cefdinir (OMNICEF) 300 MG capsule   Oral   Take 300 mg by mouth 2 (two) times daily. 10 day dose started 04-30-12 for URI         . folic acid (FOLVITE) 1 MG tablet   Oral   Take 1 mg by mouth daily.         Marland Kitchen lisinopril (PRINIVIL,ZESTRIL) 40 MG tablet   Oral   Take 40 mg by mouth daily.         . magnesium gluconate (MAGONATE) 500 MG tablet   Oral   Take 500 mg by mouth daily.         . methotrexate (RHEUMATREX) 2.5 MG tablet   Oral   Take 22.5 mg by mouth once a week. Takes 9 tablets weekly.         . Multiple Vitamin (MULTI-VITAMIN PO)   Oral   Take 1 tablet by mouth daily.          . nitroGLYCERIN (NITROSTAT) 0.4 MG SL tablet   Sublingual   Place 0.4 mg under the tongue every 5 (five) minutes as needed for chest pain.         . Omega-3 Fatty Acids (FISH OIL PO)   Oral   Take 2 capsules by mouth  2 (two) times daily.          Marland Kitchen OVER THE COUNTER MEDICATION   Oral   Take 1 tablet by mouth daily. Herbal to increase testosterone levels.         . predniSONE (DELTASONE) 5 MG tablet   Oral   Take 5 mg by mouth daily.         . rosuvastatin (CRESTOR) 20 MG tablet   Oral   Take 20 mg by mouth every Monday, Wednesday, and Friday.           BP 120/77  Pulse 89  Temp(Src) 98.1 F (36.7 C) (Oral)  Resp 20  SpO2 94%  Physical Exam  Nursing note and vitals reviewed. Constitutional: He appears well-developed and well-nourished. No distress.  HENT:  Head: Normocephalic and atraumatic.  Eyes: Conjunctivae are normal. Right eye exhibits no discharge. Left eye exhibits no discharge.  Neck: Neck supple.  Cardiovascular: Normal rate, regular rhythm and normal heart sounds.  Exam reveals no gallop and no friction rub.   No murmur heard. Pulmonary/Chest: Effort normal and breath sounds normal. No respiratory distress. He exhibits tenderness.  Tenderness of the L chest wall in area of anterior axillary line in the region of the sixth through eighth ribs  Abdominal: Soft. He exhibits no  distension. There is no tenderness.  Musculoskeletal: He exhibits no edema and no tenderness.  Lower extremities symmetric as compared to each other. No calf tenderness. Negative Homan's. No palpable cords.   Neurological: He is alert.  Skin: Skin is warm and dry.  Psychiatric: He has a normal mood and affect. His behavior is normal. Thought content normal.    ED Course  Procedures (including critical care time)  Labs Reviewed  CBC WITH DIFFERENTIAL - Abnormal; Notable for the following:    Monocytes Absolute 1.1 (*)    Eosinophils Relative 6 (*)    All other components within normal limits  POCT I-STAT, CHEM 8 - Abnormal; Notable for the following:    Glucose, Bld 105 (*)    All other components within normal limits  POCT I-STAT TROPONIN I   Dg Chest 2 View  05/03/2012  *RADIOLOGY REPORT*  Clinical Data: Left-sided abdominal pain, cough.  CHEST - 2 VIEW  Comparison: 02/21/2006  Findings: Prior CABG.  The heart is normal size.  No focal airspace opacity or effusion.  No acute bony abnormality.  IMPRESSION: Prior CABG.  No acute cardiopulmonary disease.   Original Report Authenticated By: Charlett Nose, M.D.    EKG:  Rhythm: normal sinus Vent. rate 70 BPM PR interval 160 ms QRS duration 104 ms QT/QTc 386/416 ms ST segments: normal   1. Left-sided chest wall pain       MDM  62 year old male with left-sided chest pain. Very atypical for ACS although patient does have a known history of CAD. His chest pain is very reproducible. Suspect muscular strain from multiple bouts of coughing recently. His chest x-ray is clear. EKG is fairly unremarkable. Troponin is normal. Doubt pulmonary embolism. No concerning skin lesions to suggest zoster. Plan symptomatic treatment of patient's cough and when necessary pain medication for his chest pain.        Raeford Razor, MD 05/05/12 [phone]

## 2012-06-14 ENCOUNTER — Emergency Department (HOSPITAL_COMMUNITY)
Admission: EM | Admit: 2012-06-14 | Discharge: 2012-06-14 | Disposition: A | Discharge status: Home / Self Care | Attending: Emergency Medicine | Admitting: Emergency Medicine

## 2012-06-14 ENCOUNTER — Encounter (HOSPITAL_COMMUNITY): Payer: Self-pay | Admitting: Family Medicine

## 2012-06-14 ENCOUNTER — Emergency Department (HOSPITAL_COMMUNITY)

## 2012-06-14 DIAGNOSIS — R0602 Shortness of breath: Secondary | ICD-10-CM | POA: Insufficient documentation

## 2012-06-14 DIAGNOSIS — S298XXA Other specified injuries of thorax, initial encounter: Secondary | ICD-10-CM | POA: Insufficient documentation

## 2012-06-14 DIAGNOSIS — Z8709 Personal history of other diseases of the respiratory system: Secondary | ICD-10-CM | POA: Insufficient documentation

## 2012-06-14 DIAGNOSIS — I10 Essential (primary) hypertension: Secondary | ICD-10-CM | POA: Insufficient documentation

## 2012-06-14 DIAGNOSIS — Z9861 Coronary angioplasty status: Secondary | ICD-10-CM | POA: Insufficient documentation

## 2012-06-14 DIAGNOSIS — E7800 Pure hypercholesterolemia, unspecified: Secondary | ICD-10-CM | POA: Insufficient documentation

## 2012-06-14 DIAGNOSIS — S2239XA Fracture of one rib, unspecified side, initial encounter for closed fracture: Secondary | ICD-10-CM | POA: Insufficient documentation

## 2012-06-14 DIAGNOSIS — R109 Unspecified abdominal pain: Secondary | ICD-10-CM | POA: Insufficient documentation

## 2012-06-14 DIAGNOSIS — R05 Cough: Secondary | ICD-10-CM | POA: Insufficient documentation

## 2012-06-14 DIAGNOSIS — Z951 Presence of aortocoronary bypass graft: Secondary | ICD-10-CM | POA: Insufficient documentation

## 2012-06-14 DIAGNOSIS — Y939 Activity, unspecified: Secondary | ICD-10-CM | POA: Insufficient documentation

## 2012-06-14 DIAGNOSIS — Z8739 Personal history of other diseases of the musculoskeletal system and connective tissue: Secondary | ICD-10-CM | POA: Insufficient documentation

## 2012-06-14 DIAGNOSIS — Y929 Unspecified place or not applicable: Secondary | ICD-10-CM | POA: Insufficient documentation

## 2012-06-14 DIAGNOSIS — F17200 Nicotine dependence, unspecified, uncomplicated: Secondary | ICD-10-CM | POA: Insufficient documentation

## 2012-06-14 DIAGNOSIS — R059 Cough, unspecified: Secondary | ICD-10-CM | POA: Insufficient documentation

## 2012-06-14 DIAGNOSIS — S2341XA Sprain of ribs, initial encounter: Secondary | ICD-10-CM | POA: Insufficient documentation

## 2012-06-14 DIAGNOSIS — I2510 Atherosclerotic heart disease of native coronary artery without angina pectoris: Secondary | ICD-10-CM | POA: Insufficient documentation

## 2012-06-14 DIAGNOSIS — S2232XA Fracture of one rib, left side, initial encounter for closed fracture: Secondary | ICD-10-CM

## 2012-06-14 DIAGNOSIS — X58XXXA Exposure to other specified factors, initial encounter: Secondary | ICD-10-CM | POA: Insufficient documentation

## 2012-06-14 LAB — COMPREHENSIVE METABOLIC PANEL
AST: 39 U/L — ABNORMAL HIGH (ref 0–37)
Albumin: 3.7 g/dL (ref 3.5–5.2)
Calcium: 9.4 mg/dL (ref 8.4–10.5)
Chloride: 105 mEq/L (ref 96–112)
Creatinine, Ser: 0.83 mg/dL (ref 0.50–1.35)
Total Protein: 6.8 g/dL (ref 6.0–8.3)

## 2012-06-14 LAB — CBC WITH DIFFERENTIAL/PLATELET
Basophils Absolute: 0 10*3/uL (ref 0.0–0.1)
Basophils Relative: 0 % (ref 0–1)
Eosinophils Absolute: 0.5 10*3/uL (ref 0.0–0.7)
Eosinophils Relative: 5 % (ref 0–5)
HCT: 44.4 % (ref 39.0–52.0)
Hemoglobin: 16 g/dL (ref 13.0–17.0)
Lymphocytes Relative: 22 % (ref 12–46)
Lymphs Abs: 2.2 K/uL (ref 0.7–4.0)
MCH: 33.8 pg (ref 26.0–34.0)
MCHC: 36 g/dL (ref 30.0–36.0)
MCV: 93.9 fL (ref 78.0–100.0)
Monocytes Absolute: 1.5 10*3/uL — ABNORMAL HIGH (ref 0.1–1.0)
Monocytes Relative: 14 % — ABNORMAL HIGH (ref 3–12)
Neutro Abs: 6.1 10*3/uL (ref 1.7–7.7)
Neutrophils Relative %: 59 % (ref 43–77)
Platelets: 225 K/uL (ref 150–400)
RBC: 4.73 MIL/uL (ref 4.22–5.81)
RDW: 13.4 % (ref 11.5–15.5)
WBC: 10.4 K/uL (ref 4.0–10.5)

## 2012-06-14 LAB — URINALYSIS, ROUTINE W REFLEX MICROSCOPIC
Bilirubin Urine: NEGATIVE
Glucose, UA: NEGATIVE mg/dL
Hgb urine dipstick: NEGATIVE
Ketones, ur: NEGATIVE mg/dL
Leukocytes, UA: NEGATIVE
Nitrite: NEGATIVE
Protein, ur: NEGATIVE mg/dL
Specific Gravity, Urine: 1.024 (ref 1.005–1.030)
Urobilinogen, UA: 1 mg/dL (ref 0.0–1.0)
pH: 6 (ref 5.0–8.0)

## 2012-06-14 LAB — LIPASE, BLOOD: Lipase: 20 U/L (ref 11–59)

## 2012-06-14 LAB — COMPREHENSIVE METABOLIC PANEL WITH GFR
ALT: 56 U/L — ABNORMAL HIGH (ref 0–53)
Alkaline Phosphatase: 79 U/L (ref 39–117)
BUN: 16 mg/dL (ref 6–23)
CO2: 23 meq/L (ref 19–32)
GFR calc Af Amer: >90 mL/min (ref >90)
GFR calc non Af Amer: >90 mL/min (ref >90)
Glucose, Bld: 87 mg/dL (ref 70–99)
Potassium: 3.9 meq/L (ref 3.5–5.1)
Sodium: 139 meq/L (ref 135–145)
Total Bilirubin: 1.4 mg/dL — ABNORMAL HIGH (ref 0.3–1.2)

## 2012-06-14 LAB — POCT I-STAT TROPONIN I: Troponin i, poc: 0 ng/mL (ref 0.00–0.08)

## 2012-06-14 MED ORDER — HYDROMORPHONE HCL PF 1 MG/ML IJ SOLN
1.0000 mg | Freq: Once | INTRAMUSCULAR | Status: AC
  Administered 2012-06-14: 1 mg via INTRAVENOUS
  Filled 2012-06-14: qty 1

## 2012-06-14 MED ORDER — HYDROMORPHONE HCL 2 MG PO TABS: 2.0000 mg | ORAL_TABLET | ORAL | Status: DC | PRN

## 2012-06-14 MED ORDER — METHYLPREDNISOLONE SODIUM SUCC 125 MG IJ SOLR
125.0000 mg | Freq: Once | INTRAMUSCULAR | Status: AC
  Administered 2012-06-14: 125 mg via INTRAVENOUS
  Filled 2012-06-14: qty 2

## 2012-06-14 MED ORDER — IOHEXOL 350 MG/ML SOLN
100.0000 mL | Freq: Once | INTRAVENOUS | Status: AC | PRN
  Administered 2012-06-14: 100 mL via INTRAVENOUS

## 2012-06-14 MED ORDER — DIPHENHYDRAMINE HCL 50 MG/ML IJ SOLN
50.0000 mg | Freq: Once | INTRAMUSCULAR | Status: AC
  Administered 2012-06-14: 50 mg via INTRAVENOUS
  Filled 2012-06-14: qty 1

## 2012-06-14 NOTE — Discharge Instructions (Signed)
 Rib Fracture Your caregiver has diagnosed you as having a rib fracture (a break). This can occur by a blow to the chest, by a fall against a hard object, or by violent coughing or sneezing. There may be one or many breaks. Rib fractures may heal on their own within 3 to 8 weeks. The longer healing period is usually associated with a continued cough or other aggravating activities. HOME CARE INSTRUCTIONS   Avoid strenuous activity. Be careful during activities and avoid bumping the injured rib. Activities that cause pain pull on the fracture site(s) and are best avoided if possible.  Eat a normal, well-balanced diet. Drink plenty of fluids to avoid constipation.  Take deep breaths several times a day to keep lungs free of infection. Try to cough several times a day, splinting the injured area with a pillow. This will help prevent pneumonia.  Do not wear a rib belt or binder. These restrict breathing which can lead to pneumonia.  Only take over-the-counter or prescription medicines for pain, discomfort, or fever as directed by your caregiver. SEEK MEDICAL CARE IF:  You develop a continual cough, associated with thick or bloody sputum. SEEK IMMEDIATE MEDICAL CARE IF:   You have a fever.  You have difficulty breathing.  You have nausea (feeling sick to your stomach), vomiting, or abdominal (belly) pain.  You have worsening pain, not controlled with medications. Document Released: 02/18/2005 Document Revised: 05/13/2011 Document Reviewed: 07/23/2006 [facility] Patient Information 2013 [place], [place].    Narcotic and benzodiazepine use may cause drowsiness, slowed breathing or dependence.  Please use with caution and do not drive, operate machinery or watch young children alone while taking them.  Taking combinations of these medications or drinking alcohol will potentiate these effects.

## 2012-06-14 NOTE — ED Notes (Signed)
Return from xray

## 2012-06-14 NOTE — ED Notes (Signed)
Patient transported to X-ray 

## 2012-06-14 NOTE — ED Provider Notes (Signed)
History     CSN: [id number]  Arrival date & time 06/14/12  0825   First MD Initiated Contact with Patient 06/14/12 [id number]      Chief Complaint  Patient presents with  . Back Pain    (Consider location/radiation/quality/duration/timing/severity/associated sxs/prior treatment) HPI Comments: Patient previously was diagnosed with left rib cage strain related to severe coughing and bronchitis approximately one to 2 months ago. Patient was evaluated by cardiology workup and chest x-ray. I reviewed notes from the emergency department, [facility] dating from early March. Patient has significant history of hypertension, status post CABG, Polymyalgia Rheumatica and takes 5 mg prednisone daily.  Patient reports pain has gotten worse near the same location but is now spread to the left flank and upper left posterior rib cage as well. This has become worse over the last week to the point that he is unable to lay flat and has difficulty taking a deep breath, coughing or twisting his torso. Patient has been taking Tylenol extra strength as well as some Vicodin that he takes as needed for his PMR with no significant relief. Patient admits that he has been coughing some but is limited in his ability to cough due to the pain. He denies any lower extremity swelling or calf tenderness, or recent travel. No obvious sick contacts and denies any fever or chills. Patient reports an iodine allergy after a heart cath. He developed a rash around his waistline that improved after some Atarax or Benadryl to his recollection. He denies any throat swelling or wheezing. He denies any flank pain, radiation of pain to the groin, hematuria or dysuria.  Patient is a 62 y.o. male presenting with back pain. The history is provided by the patient, the spouse and medical records.  Back Pain Associated symptoms: chest pain   Associated symptoms: no abdominal pain, no dysuria and no fever     Past Medical History  Diagnosis Date  . Coronary  disease   . Hypercholesterolemia   . Polymyalgia rheumatica   . Bronchitis   . Insomnia   . Tobacco abuse   . History of chest pain   . Hypertension     Past Surgical History  Procedure Laterality Date  . Cardiac catheterization  03/16/1999    ejection fraction 65%--After surgery pt. developed hives and itching  . Coronary artery bypass graft  03/19/1999    History reviewed. No pertinent family history.  History  Substance Use Topics  . Smoking status: Current Some Day Smoker    Types: Cigars  . Smokeless tobacco: Not on file  . Alcohol Use: 0.6 oz/week    1 Cans of beer per week      Review of Systems  Constitutional: Negative for fever, chills and appetite change.  Respiratory: Positive for cough and shortness of breath. Negative for wheezing.   Cardiovascular: Positive for chest pain. Negative for palpitations and leg swelling.  Gastrointestinal: Negative for nausea, vomiting and abdominal pain.  Genitourinary: Positive for flank pain. Negative for dysuria, urgency and testicular pain.  Musculoskeletal: Positive for back pain.  Skin: Negative for rash and wound.  All other systems reviewed and are negative.    Allergies  Oxycodone; Iodine; and Zocor  Home Medications   Current Outpatient Rx  Name  Route  Sig  Dispense  Refill  . albuterol (PROVENTIL HFA;VENTOLIN HFA) 108 (90 BASE) MCG/ACT inhaler   Inhalation   Inhale 1-2 puffs into the lungs every 4 (four) hours as needed for wheezing or shortness of  breath.         Marland Kitchen amLODipine (NORVASC) 5 MG tablet   Oral   Take 5 mg by mouth every evening.          Marland Kitchen aspirin 81 MG tablet   Oral   Take 81 mg by mouth every evening.          . folic acid (FOLVITE) 1 MG tablet   Oral   Take 1 mg by mouth daily.         Marland Kitchen lisinopril (PRINIVIL,ZESTRIL) 40 MG tablet   Oral   Take 40 mg by mouth daily.         . magnesium gluconate (MAGONATE) 500 MG tablet   Oral   Take 500 mg by mouth every evening.            . methotrexate (RHEUMATREX) 2.5 MG tablet   Oral   Take 20 mg by mouth once a week. Takes 9 tablets weekly.         . Multiple Vitamin (MULTI-VITAMIN PO)   Oral   Take 1 tablet by mouth daily.          . nitroGLYCERIN (NITROSTAT) 0.4 MG SL tablet   Sublingual   Place 0.4 mg under the tongue every 5 (five) minutes as needed for chest pain.         . Omega-3 Fatty Acids (FISH OIL) 500 MG CAPS   Oral   Take 2 capsules by mouth 2 (two) times daily.         . predniSONE (DELTASONE) 5 MG tablet   Oral   Take 5 mg by mouth daily.         Marland Kitchen PRESCRIPTION MEDICATION   Oral   Take 1 tablet by mouth daily. "Julaine Fusi" treatment for low testosterone.         . rosuvastatin (CRESTOR) 20 MG tablet   Oral   Take 20 mg by mouth every Monday, Wednesday, and Friday. M,w,f in the evening         . HYDROmorphone (DILAUDID) 2 MG tablet   Oral   Take 1 tablet (2 mg total) by mouth every 4 (four) hours as needed for pain.   30 tablet   0     BP 111/71  Pulse 117  Temp(Src) 98.5 F (36.9 C)  Resp 18  SpO2 93%  Physical Exam  Nursing note and vitals reviewed. Constitutional: He is oriented to person, place, and time. He appears well-developed and well-nourished. No distress.  HENT:  Head: Normocephalic and atraumatic.  Eyes: EOM are normal. No scleral icterus.  Neck: Normal range of motion. Neck supple.  Cardiovascular: Normal rate and regular rhythm.   No murmur heard. Pulmonary/Chest: Effort normal. No accessory muscle usage. No respiratory distress. He has no decreased breath sounds. He has no wheezes. He has no rhonchi. He has no rales. He exhibits tenderness and bony tenderness. He exhibits no crepitus, no edema and no retraction.    Abdominal: Soft. He exhibits no distension. There is no tenderness. There is no rebound.  Musculoskeletal:       Back:  Neurological: He is alert and oriented to person, place, and time. He exhibits normal muscle tone.  Coordination normal.  Skin: Skin is warm and dry. No rash noted. He is not diaphoretic. No pallor.  Psychiatric: He has a normal mood and affect.    ED Course  Procedures (including critical care time)  Labs Reviewed  CBC WITH DIFFERENTIAL - Abnormal; Notable  for the following:    Monocytes Relative 14 (*)    Monocytes Absolute 1.5 (*)    All other components within normal limits  COMPREHENSIVE METABOLIC PANEL - Abnormal; Notable for the following:    AST 39 (*)    ALT 56 (*)    Total Bilirubin 1.4 (*)    All other components within normal limits  LIPASE, BLOOD  URINALYSIS, ROUTINE W REFLEX MICROSCOPIC  POCT I-STAT TROPONIN I   Dg Chest 2 View  06/14/2012  *RADIOLOGY REPORT*  Clinical Data: Left chest pain  CHEST - 2 VIEW  Comparison: 05/03/2012  Findings: Low lung volumes with mild bilateral lower lobe atelectasis.  No focal consolidation. No pleural effusion or pneumothorax.  The heart is top normal in size. Postsurgical changes related to prior CABG.  Degenerative changes of the visualized thoracolumbar spine.  IMPRESSION: Low lung volumes with mild bilateral lower lobe atelectasis.   Original Report Authenticated By: Charline Bills, M.D.    Ct Angio Chest W/cm &/or Wo Cm  06/14/2012  *RADIOLOGY REPORT*  Clinical Data: Back/left rib pain  CT ANGIOGRAPHY CHEST  Technique:  Multidetector CT imaging of the chest using the standard protocol during bolus administration of intravenous contrast. Multiplanar reconstructed images including MIPs were obtained and reviewed to evaluate the vascular anatomy.  Contrast: OMNIPAQUE IOHEXOL 350 MG/ML SOLN  Comparison: Chest radiographs dated 06/14/2012  Findings: No evidence of pulmonary embolism.  Mild scarring versus atelectasis in the left lower lobe.  No suspicious pulmonary nodules.  No pleural effusion or pneumothorax.  Visualized thyroid is unremarkable.  The heart is normal in size.  No pericardial effusion. Postsurgical changes related  to prior CABG.  Atherosclerotic calcifications of the aortic arch.  No suspicious mediastinal, hilar, or axillary lymphadenopathy.  Visualized upper abdomen is notable for moderate hepatic steatosis.  Mild degenerative changes of the visualized thoracolumbar spine. Nondisplaced left posterolateral 8th rib fracture (series 4/image 58).  IMPRESSION: No evidence of pulmonary embolism.  Nondisplaced left posterolateral 8th rib fracture.   Original Report Authenticated By: Charline Bills, M.D.      1. Rib fracture, left, closed, initial encounter     Room air saturation is 95% I interpret this to be adequate.  EKG at time of 09:05, shows sinus rhythm at a rate of 68, axis is normal. Borderline nonspecific intraventricular conduction delay is noted. No ST or T-wave abnormalities. No significant change compared to EKG from 05/03/2012. Interpretation is normal EKG.  1:01 PM Pt is more comfortable after second dose of IV dilaudid.  Rib fracture seen on CT, no trauma, likely due to coughing episode.  Pt and family reassured.  Pt has itching with oxycodone.  Pt can take 2 vicodin at a time at home which pt has only been taking 1 at a time, will also gie a Rx for hydromorphone as well.    MDM  Plan is to get labs, CXR.  Unlikely to be coronary given sharp nature, reproducible CP.  Will need CT of chest, will premedicate with solumedrol and benadryl prior to scan.        William [name]. Oletta Lamas, MD 06/16/12 [id number]

## 2012-06-14 NOTE — ED Notes (Signed)
Per pt sts 1 week of sharp stabbing pain in left upper back radiating around to left rib area. sts hurts worse with movement and when he takes a deep breath. sts the pain has gotten worse over the past week.

## 2012-06-14 NOTE — ED Notes (Signed)
Pt knows that urine is needed 

## 2012-09-12 ENCOUNTER — Emergency Department (HOSPITAL_COMMUNITY)

## 2012-09-12 ENCOUNTER — Emergency Department (HOSPITAL_COMMUNITY)
Admission: EM | Admit: 2012-09-12 | Discharge: 2012-09-12 | Disposition: A | Discharge status: Home / Self Care | Attending: Emergency Medicine | Admitting: Emergency Medicine

## 2012-09-12 ENCOUNTER — Encounter (HOSPITAL_COMMUNITY): Payer: Self-pay | Admitting: *Deleted

## 2012-09-12 DIAGNOSIS — I2510 Atherosclerotic heart disease of native coronary artery without angina pectoris: Secondary | ICD-10-CM | POA: Insufficient documentation

## 2012-09-12 DIAGNOSIS — R509 Fever, unspecified: Secondary | ICD-10-CM | POA: Insufficient documentation

## 2012-09-12 DIAGNOSIS — R300 Dysuria: Secondary | ICD-10-CM | POA: Insufficient documentation

## 2012-09-12 DIAGNOSIS — Z79899 Other long term (current) drug therapy: Secondary | ICD-10-CM | POA: Insufficient documentation

## 2012-09-12 DIAGNOSIS — E7800 Pure hypercholesterolemia, unspecified: Secondary | ICD-10-CM | POA: Insufficient documentation

## 2012-09-12 DIAGNOSIS — F17200 Nicotine dependence, unspecified, uncomplicated: Secondary | ICD-10-CM | POA: Insufficient documentation

## 2012-09-12 DIAGNOSIS — R059 Cough, unspecified: Secondary | ICD-10-CM | POA: Insufficient documentation

## 2012-09-12 DIAGNOSIS — Z8739 Personal history of other diseases of the musculoskeletal system and connective tissue: Secondary | ICD-10-CM | POA: Insufficient documentation

## 2012-09-12 DIAGNOSIS — R197 Diarrhea, unspecified: Secondary | ICD-10-CM | POA: Insufficient documentation

## 2012-09-12 DIAGNOSIS — Z951 Presence of aortocoronary bypass graft: Secondary | ICD-10-CM | POA: Insufficient documentation

## 2012-09-12 DIAGNOSIS — Z7982 Long term (current) use of aspirin: Secondary | ICD-10-CM | POA: Insufficient documentation

## 2012-09-12 DIAGNOSIS — R05 Cough: Secondary | ICD-10-CM | POA: Insufficient documentation

## 2012-09-12 DIAGNOSIS — I10 Essential (primary) hypertension: Secondary | ICD-10-CM | POA: Insufficient documentation

## 2012-09-12 DIAGNOSIS — R112 Nausea with vomiting, unspecified: Secondary | ICD-10-CM | POA: Insufficient documentation

## 2012-09-12 DIAGNOSIS — Z8709 Personal history of other diseases of the respiratory system: Secondary | ICD-10-CM | POA: Insufficient documentation

## 2012-09-12 DIAGNOSIS — IMO0002 Reserved for concepts with insufficient information to code with codable children: Secondary | ICD-10-CM | POA: Insufficient documentation

## 2012-09-12 LAB — URINE MICROSCOPIC-ADD ON

## 2012-09-12 LAB — BASIC METABOLIC PANEL
CO2: 22 mEq/L (ref 19–32)
Calcium: 9.2 mg/dL (ref 8.4–10.5)
GFR calc non Af Amer: 90 mL/min — ABNORMAL LOW (ref >90)
Glucose, Bld: 124 mg/dL — ABNORMAL HIGH (ref 70–99)
Potassium: 4.1 mEq/L (ref 3.5–5.1)
Sodium: 131 mEq/L — ABNORMAL LOW (ref 135–145)

## 2012-09-12 LAB — CBC WITH DIFFERENTIAL/PLATELET
Basophils Absolute: 0.1 10*3/uL (ref 0.0–0.1)
Eosinophils Absolute: 0.1 10*3/uL (ref 0.0–0.7)
Lymphocytes Relative: 17 % (ref 12–46)
Lymphs Abs: 0.9 10*3/uL (ref 0.7–4.0)
Neutrophils Relative %: 68 % (ref 43–77)
Platelets: 120 10*3/uL — ABNORMAL LOW (ref 150–400)
RBC: 4.82 MIL/uL (ref 4.22–5.81)
RDW: 13.4 % (ref 11.5–15.5)
WBC: 5 10*3/uL (ref 4.0–10.5)

## 2012-09-12 LAB — URINALYSIS, ROUTINE W REFLEX MICROSCOPIC
Glucose, UA: NEGATIVE mg/dL
Ketones, ur: NEGATIVE mg/dL
Leukocytes, UA: NEGATIVE
Nitrite: NEGATIVE
Protein, ur: NEGATIVE mg/dL
Urobilinogen, UA: 0.2 mg/dL (ref 0.0–1.0)

## 2012-09-12 LAB — CG4 I-STAT (LACTIC ACID): Lactic Acid, Venous: 1.37 mmol/L (ref 0.5–2.2)

## 2012-09-12 MED ORDER — DOXYCYCLINE HYCLATE 100 MG PO CAPS: 100.0000 mg | ORAL_CAPSULE | Freq: Two times a day (BID) | ORAL | Status: DC

## 2012-09-12 MED ORDER — DOXYCYCLINE HYCLATE 100 MG PO TABS
100.0000 mg | ORAL_TABLET | Freq: Once | ORAL | Status: AC
  Administered 2012-09-12: 100 mg via ORAL
  Filled 2012-09-12: qty 1

## 2012-09-12 MED ORDER — ACETAMINOPHEN 325 MG PO TABS
650.0000 mg | ORAL_TABLET | Freq: Four times a day (QID) | ORAL | Status: DC | PRN
  Administered 2012-09-12: 650 mg via ORAL
  Filled 2012-09-12: qty 2

## 2012-09-12 NOTE — ED Notes (Signed)
Pt takes he last took tylenol around 130 pm.

## 2012-09-12 NOTE — ED Notes (Signed)
Tuesday went to [place] and had spot removed on back.  Thursday started getting chills and temp 102.7 and felt better in one hour.  Pt has been having this continuous until last nite.  Pt states that it came back one hour ago and it was 102.1 and took Tylenol, now back to normal.  Site has redness and tenderness and has blister area to surgical site

## 2012-09-12 NOTE — ED Provider Notes (Signed)
History    CSN: [id number] Arrival date & time 09/12/12  1417  First MD Initiated Contact with Patient 09/12/12 1608     Chief Complaint  Patient presents with  . Fever   (Consider location/radiation/quality/duration/timing/severity/associated sxs/prior Treatment) Patient is a 62 y.o. male presenting with fever. The history is provided by the patient and the spouse. No language interpreter was used.  Fever Temp source:  Oral Associated symptoms: chills   Associated symptoms comment:  He presents with complaint of fever at home associated with chills/rigors, with Tmax 102. He went to the [place] to have a skin lesion surgically removed from his back 5 days ago. No complications of surgery. Chills and fever started 2 days later and have been persistent. Mild cough, dysuria, complaint of pain, N, V or diarrhea.  Past Medical History  Diagnosis Date  . Coronary disease   . Hypercholesterolemia   . Polymyalgia rheumatica   . Bronchitis   . Insomnia   . Tobacco abuse   . History of chest pain   . Hypertension    Past Surgical History  Procedure Laterality Date  . Cardiac catheterization  03/16/1999    ejection fraction 65%--After surgery pt. developed hives and itching  . Coronary artery bypass graft  03/19/1999   No family history on file. History  Substance Use Topics  . Smoking status: Current Some Day Smoker    Types: Cigars  . Smokeless tobacco: Not on file  . Alcohol Use: 0.6 oz/week    1 Cans of beer per week    Review of Systems  Constitutional: Positive for fever and chills.  Respiratory:       See HPI.  Cardiovascular: Negative.   Gastrointestinal: Negative.   Musculoskeletal: Negative.     Allergies  Oxycodone; Iodine; and Zocor  Home Medications   Current Outpatient Rx  Name  Route  Sig  Dispense  Refill  . albuterol (PROVENTIL HFA;VENTOLIN HFA) 108 (90 BASE) MCG/ACT inhaler   Inhalation   Inhale 1-2 puffs into the lungs every 4 (four) hours as needed  for wheezing or shortness of breath.         Marland Kitchen amLODipine (NORVASC) 5 MG tablet   Oral   Take 5 mg by mouth every evening.          Marland Kitchen aspirin 81 MG tablet   Oral   Take 81 mg by mouth every evening.          Marland Kitchen atorvastatin (LIPITOR) 40 MG tablet   Oral   Take 40 mg by mouth every evening.         . folic acid (FOLVITE) 1 MG tablet   Oral   Take 1 mg by mouth daily.         Marland Kitchen HYDROmorphone (DILAUDID) 2 MG tablet   Oral   Take 1 tablet (2 mg total) by mouth every 4 (four) hours as needed for pain.   30 tablet   0   . lisinopril (PRINIVIL,ZESTRIL) 40 MG tablet   Oral   Take 40 mg by mouth daily.         . magnesium gluconate (MAGONATE) 500 MG tablet   Oral   Take 500 mg by mouth every evening.          . methotrexate (RHEUMATREX) 2.5 MG tablet   Oral   Take 20 mg by mouth once a week. Takes 9 tablets weekly. Take on Sunday         . Multiple  Vitamin (MULTI-VITAMIN PO)   Oral   Take 1 tablet by mouth daily.          . Omega-3 Fatty Acids (FISH OIL) 500 MG CAPS   Oral   Take 2 capsules by mouth 2 (two) times daily.         . predniSONE (DELTASONE) 5 MG tablet   Oral   Take 10 mg by mouth daily.          Marland Kitchen PRESCRIPTION MEDICATION   Oral   Take 1 tablet by mouth daily. "Julaine Fusi" treatment for low testosterone.         . nitroGLYCERIN (NITROSTAT) 0.4 MG SL tablet   Sublingual   Place 0.4 mg under the tongue every 5 (five) minutes as needed for chest pain.          BP 129/74  Pulse 73  Temp(Src) 98.4 F (36.9 C) (Oral)  Resp 22  SpO2 97% Physical Exam  Constitutional: He is oriented to person, place, and time. He appears well-developed and well-nourished.  HENT:  Head: Normocephalic.  Neck: Normal range of motion. Neck supple.  Cardiovascular: Normal rate and regular rhythm.   Pulmonary/Chest: Effort normal and breath sounds normal.  Abdominal: Soft. Bowel sounds are normal. There is no tenderness. There is no rebound and no  guarding.  Genitourinary: Prostate normal.  Musculoskeletal: Normal range of motion.  Neurological: He is alert and oriented to person, place, and time.  Skin: Skin is warm and dry. No rash noted.  3 cm sutured incision to right mid-back without drainage. Sutures intact. Small blister to inferior margin of incision. Mild erythema surrounding wound.  Psychiatric: He has a normal mood and affect.    ED Course  Procedures (including critical care time) Labs Reviewed  CBC WITH DIFFERENTIAL - Abnormal; Notable for the following:    Platelets 120 (*)    All other components within normal limits  BASIC METABOLIC PANEL - Abnormal; Notable for the following:    Sodium 131 (*)    Glucose, Bld 124 (*)    GFR calc non Af Amer 90 (*)    All other components within normal limits  URINALYSIS, ROUTINE W REFLEX MICROSCOPIC  CG4 I-STAT (LACTIC ACID)   Results for orders placed during the hospital encounter of 09/12/12  CBC WITH DIFFERENTIAL      Result Value Range   WBC 5.0  4.0 - 10.5 K/uL   RBC 4.82  4.22 - 5.81 MIL/uL   Hemoglobin 15.7  13.0 - 17.0 g/dL   HCT 16.1  09.6 - 04.5 %   MCV 93.8  78.0 - 100.0 fL   MCH 32.6  26.0 - 34.0 pg   MCHC 34.7  30.0 - 36.0 g/dL   RDW 40.9  81.1 - 91.4 %   Platelets 120 (*) 150 - 400 K/uL   Neutrophils Relative % 68  43 - 77 %   Neutro Abs 3.4  1.7 - 7.7 K/uL   Lymphocytes Relative 17  12 - 46 %   Lymphs Abs 0.9  0.7 - 4.0 K/uL   Monocytes Relative 12  3 - 12 %   Monocytes Absolute 0.6  0.1 - 1.0 K/uL   Eosinophils Relative 2  0 - 5 %   Eosinophils Absolute 0.1  0.0 - 0.7 K/uL   Basophils Relative 1  0 - 1 %   Basophils Absolute 0.1  0.0 - 0.1 K/uL  BASIC METABOLIC PANEL      Result Value Range  Sodium 131 (*) 135 - 145 mEq/L   Potassium 4.1  3.5 - 5.1 mEq/L   Chloride 101  96 - 112 mEq/L   CO2 22  19 - 32 mEq/L   Glucose, Bld 124 (*) 70 - 99 mg/dL   BUN 18  6 - 23 mg/dL   Creatinine, Ser 4.09  0.50 - 1.35 mg/dL   Calcium 9.2  8.4 - 81.1 mg/dL    GFR calc non Af Amer 90 (*) >90 mL/min   GFR calc Af Amer >90  >90 mL/min  URINALYSIS, ROUTINE W REFLEX MICROSCOPIC      Result Value Range   Color, Urine YELLOW  YELLOW   APPearance CLEAR  CLEAR   Specific Gravity, Urine 1.029  1.005 - 1.030   pH 5.0  5.0 - 8.0   Glucose, UA NEGATIVE  NEGATIVE mg/dL   Hgb urine dipstick MODERATE (*) NEGATIVE   Bilirubin Urine SMALL (*) NEGATIVE   Ketones, ur NEGATIVE  NEGATIVE mg/dL   Protein, ur NEGATIVE  NEGATIVE mg/dL   Urobilinogen, UA 0.2  0.0 - 1.0 mg/dL   Nitrite NEGATIVE  NEGATIVE   Leukocytes, UA NEGATIVE  NEGATIVE  URINE MICROSCOPIC-ADD ON      Result Value Range   Squamous Epithelial / LPF RARE  RARE   WBC, UA 0-2  <3 WBC/hpf   RBC / HPF 3-6  <3 RBC/hpf   Bacteria, UA RARE  RARE  CG4 I-STAT (LACTIC ACID)      Result Value Range   Lactic Acid, Venous 1.37  0.5 - 2.2 mmol/L    Dg Chest 2 View  09/12/2012   *RADIOLOGY REPORT*  Clinical Data: Cough and fever  CHEST - 2 VIEW  Comparison: 06/14/2012  Findings: Prior CABG.  Lungs are not clear without infiltrate or effusion.  Bibasilar atelectasis has resolved since the prior study.  Negative for heart failure or mass lesion.  IMPRESSION: No active cardiopulmonary abnormality.   Original Report Authenticated By: Janeece Riggers, M.D.   No diagnosis found. 1. Febrile illness  MDM  No findings to support diagnosis of infection as cause of fever. He is on chronic prednisone and so immunocompromised. No leukocytosis, CXR showing atx, urine without bacteria. Surgical wound appears slightly red without evidence of drainage or abscess. DDx: post operative wound infection (early) vs bronchitis. Will treat with Doxycycline empirically and close follow up with PCP.   Arnoldo Hooker, PA-C 09/12/12 1956

## 2012-09-12 NOTE — ED Provider Notes (Signed)
Pt had a biopsy 4 days ago. He reports shaking chills every 4 hrs the past couple of days with loss of appetite and a "knot" in his abdomen without pain.  Pt has a linear incision on his right post chest that has some redness about 1 cm from the incision and a small clear fluid filled vesicle with mild firmness just inferior to that. He is noted to have some coughing.    Medical screening examination/treatment/procedure(s) were conducted as a shared visit with non-physician practitioner(s) and myself.  I personally evaluated the patient during the encounter   Ward Givens, MD 09/12/12 [id number]

## 2012-09-12 NOTE — ED Notes (Signed)
Pt c/o fever that started after having a procedure done at the [facility] hospital. sts he was getting a fever every 5 hours with taking tylenol, woke up at 3am this morning with a fever, then around 130pm he started to get chills again checked temp and it was 102.1. For the first 2 days pt had a stomach ache and headache. Pt denies ha at this time, sts he doesn't have any abd pain & sts he has been eating but has a little decreased appetitie. Denies n/v/d. Pt in nad, skin warm and dry, resp e/u. Pt sts he has had a little cough the past couple of days, nonproductive.

## 2012-09-12 NOTE — ED Notes (Signed)
Pt made aware of need for urine sample. Gave pt glass of water.

## 2012-09-12 NOTE — ED Provider Notes (Signed)
See prior note  Medical screening examination/treatment/procedure(s) were conducted as a shared visit with non-physician practitioner(s) and myself.  I personally evaluated the patient during the encounter  William Albe, MD, Franz Dell, MD 09/12/12 2002

## 2012-09-17 ENCOUNTER — Inpatient Hospital Stay (HOSPITAL_COMMUNITY)
Admission: EM | Admit: 2012-09-17 | Discharge: 2012-09-29 | DRG: 866 | Disposition: A | Discharge status: Home / Self Care | Attending: Internal Medicine | Admitting: Internal Medicine

## 2012-09-17 ENCOUNTER — Encounter (HOSPITAL_COMMUNITY): Payer: Self-pay | Admitting: Adult Health

## 2012-09-17 ENCOUNTER — Observation Stay (HOSPITAL_COMMUNITY)

## 2012-09-17 DIAGNOSIS — E861 Hypovolemia: Secondary | ICD-10-CM | POA: Diagnosis not present

## 2012-09-17 DIAGNOSIS — B2710 Cytomegaloviral mononucleosis without complications: Secondary | ICD-10-CM | POA: Insufficient documentation

## 2012-09-17 DIAGNOSIS — B2790 Infectious mononucleosis, unspecified without complication: Secondary | ICD-10-CM | POA: Diagnosis present

## 2012-09-17 DIAGNOSIS — Z85828 Personal history of other malignant neoplasm of skin: Secondary | ICD-10-CM

## 2012-09-17 DIAGNOSIS — M069 Rheumatoid arthritis, unspecified: Secondary | ICD-10-CM | POA: Diagnosis present

## 2012-09-17 DIAGNOSIS — R21 Rash and other nonspecific skin eruption: Secondary | ICD-10-CM | POA: Diagnosis present

## 2012-09-17 DIAGNOSIS — E871 Hypo-osmolality and hyponatremia: Secondary | ICD-10-CM | POA: Diagnosis present

## 2012-09-17 DIAGNOSIS — I4891 Unspecified atrial fibrillation: Secondary | ICD-10-CM | POA: Diagnosis present

## 2012-09-17 DIAGNOSIS — I4892 Unspecified atrial flutter: Secondary | ICD-10-CM | POA: Diagnosis present

## 2012-09-17 DIAGNOSIS — I10 Essential (primary) hypertension: Secondary | ICD-10-CM | POA: Diagnosis present

## 2012-09-17 DIAGNOSIS — R748 Abnormal levels of other serum enzymes: Secondary | ICD-10-CM | POA: Diagnosis present

## 2012-09-17 DIAGNOSIS — K7689 Other specified diseases of liver: Secondary | ICD-10-CM | POA: Diagnosis present

## 2012-09-17 DIAGNOSIS — G4700 Insomnia, unspecified: Secondary | ICD-10-CM | POA: Diagnosis present

## 2012-09-17 DIAGNOSIS — K5900 Constipation, unspecified: Secondary | ICD-10-CM | POA: Diagnosis not present

## 2012-09-17 DIAGNOSIS — Z79899 Other long term (current) drug therapy: Secondary | ICD-10-CM

## 2012-09-17 DIAGNOSIS — Z951 Presence of aortocoronary bypass graft: Secondary | ICD-10-CM

## 2012-09-17 DIAGNOSIS — IMO0002 Reserved for concepts with insufficient information to code with codable children: Secondary | ICD-10-CM

## 2012-09-17 DIAGNOSIS — E669 Obesity, unspecified: Secondary | ICD-10-CM | POA: Diagnosis present

## 2012-09-17 DIAGNOSIS — Z7982 Long term (current) use of aspirin: Secondary | ICD-10-CM

## 2012-09-17 DIAGNOSIS — M353 Polymyalgia rheumatica: Secondary | ICD-10-CM | POA: Diagnosis present

## 2012-09-17 DIAGNOSIS — F17200 Nicotine dependence, unspecified, uncomplicated: Secondary | ICD-10-CM | POA: Diagnosis present

## 2012-09-17 DIAGNOSIS — I252 Old myocardial infarction: Secondary | ICD-10-CM

## 2012-09-17 DIAGNOSIS — I959 Hypotension, unspecified: Secondary | ICD-10-CM | POA: Diagnosis not present

## 2012-09-17 DIAGNOSIS — R509 Fever, unspecified: Secondary | ICD-10-CM

## 2012-09-17 DIAGNOSIS — I2510 Atherosclerotic heart disease of native coronary artery without angina pectoris: Secondary | ICD-10-CM | POA: Diagnosis present

## 2012-09-17 DIAGNOSIS — Z6835 Body mass index (BMI) 35.0-35.9, adult: Secondary | ICD-10-CM

## 2012-09-17 DIAGNOSIS — B259 Cytomegaloviral disease, unspecified: Principal | ICD-10-CM | POA: Diagnosis present

## 2012-09-17 DIAGNOSIS — E785 Hyperlipidemia, unspecified: Secondary | ICD-10-CM | POA: Diagnosis present

## 2012-09-17 HISTORY — DX: Cytomegaloviral mononucleosis without complications: B27.10

## 2012-09-17 HISTORY — DX: Acute myocardial infarction, unspecified: I21.9

## 2012-09-17 HISTORY — DX: Pneumonia, unspecified organism: J18.9

## 2012-09-17 HISTORY — DX: Unspecified atrial flutter: I48.92

## 2012-09-17 LAB — COMPREHENSIVE METABOLIC PANEL
ALT: 151 U/L — ABNORMAL HIGH (ref 0–53)
AST: 83 U/L — ABNORMAL HIGH (ref 0–37)
Alkaline Phosphatase: 110 U/L (ref 39–117)
CO2: 25 mEq/L (ref 19–32)
Calcium: 9.5 mg/dL (ref 8.4–10.5)
Chloride: 99 mEq/L (ref 96–112)
GFR calc Af Amer: 81 mL/min — ABNORMAL LOW (ref >90)
GFR calc non Af Amer: 70 mL/min — ABNORMAL LOW (ref >90)
Glucose, Bld: 109 mg/dL — ABNORMAL HIGH (ref 70–99)
Sodium: 132 mEq/L — ABNORMAL LOW (ref 135–145)
Total Bilirubin: 1 mg/dL (ref 0.3–1.2)

## 2012-09-17 LAB — URINALYSIS, ROUTINE W REFLEX MICROSCOPIC
Ketones, ur: 15 mg/dL — AB
Leukocytes, UA: NEGATIVE
Nitrite: NEGATIVE
Protein, ur: 30 mg/dL — AB
Urobilinogen, UA: 1 mg/dL (ref 0.0–1.0)
pH: 5.5 (ref 5.0–8.0)

## 2012-09-17 LAB — CBC WITH DIFFERENTIAL/PLATELET
Band Neutrophils: 0 % (ref 0–10)
Blasts: 0 %
Lymphocytes Relative: 27 % (ref 12–46)
MCHC: 35.5 g/dL (ref 30.0–36.0)
MCV: 93.6 fL (ref 78.0–100.0)
Metamyelocytes Relative: 0 %
Monocytes Absolute: 1 10*3/uL (ref 0.1–1.0)
Monocytes Relative: 9 % (ref 3–12)
Platelets: 198 10*3/uL (ref 150–400)
Promyelocytes Absolute: 0 %
RDW: 13.5 % (ref 11.5–15.5)
WBC: 10.9 10*3/uL — ABNORMAL HIGH (ref 4.0–10.5)
nRBC: 0 /100 WBC

## 2012-09-17 LAB — URINE MICROSCOPIC-ADD ON

## 2012-09-17 MED ORDER — VANCOMYCIN HCL 10 G IV SOLR
1500.0000 mg | INTRAVENOUS | Status: AC
  Administered 2012-09-17: 1500 mg via INTRAVENOUS
  Filled 2012-09-17: qty 1500

## 2012-09-17 MED ORDER — PIPERACILLIN-TAZOBACTAM 3.375 G IVPB
3.3750 g | Freq: Once | INTRAVENOUS | Status: AC
  Administered 2012-09-17: 3.375 g via INTRAVENOUS
  Filled 2012-09-17: qty 50

## 2012-09-17 MED ORDER — ONDANSETRON HCL 4 MG/2ML IJ SOLN: 4.0000 mg | Freq: Three times a day (TID) | INTRAMUSCULAR | Status: DC | PRN

## 2012-09-17 MED ORDER — SODIUM CHLORIDE 0.9 % IV BOLUS (SEPSIS)
1000.0000 mL | Freq: Once | INTRAVENOUS | Status: AC
  Administered 2012-09-17: 1000 mL via INTRAVENOUS

## 2012-09-17 MED ORDER — ACETAMINOPHEN 325 MG PO TABS
650.0000 mg | ORAL_TABLET | Freq: Once | ORAL | Status: AC
  Administered 2012-09-17: 650 mg via ORAL
  Filled 2012-09-17: qty 2

## 2012-09-17 NOTE — ED Provider Notes (Signed)
I saw and evaluated the patient, reviewed the resident's note and I agree with the findings and plan.  Patient here with fever of unknown origin x8 days. Old records reviewed. Will be admitted for workup of this in.  Toy Baker, MD 09/17/12 2203

## 2012-09-17 NOTE — H&P (Signed)
[facility] History and Physical  William [name]:[id number] DOB: [birth year] DOA: 09/17/2012  Referring physician: ER physician. PCP: Vivien Presto, MD  Specialists: Dr. Zenovia Jordan. Rheumatologist.  Chief Complaint: Fever.  HPI: William [name] is a 62 y.o. male with known history of polymyalgia rheumatica, CAD status post CABG, hyperlipidemia and hypertension has been experiencing fever and chills over the last one week. William [name] had come to the ER 2 days after his symptoms had started and was placed on doxycycline. William [name] on doxycycline for 2 days after which his fever subsided and he had gone to [place]. Over there he started spiking fever again and he had gone to the ER and was placed on Bactrim empirically. As per the family workup done over there was negative for any source. Despite taking Bactrim for last 2-3 days William [name] still has fever and had come to the ER today. William [name] states that she has had a skin biopsy and excision done prior to this fever episode. Results of the biopsy are pending. Prior to the episode also William [name] has been having some joint aches and his rheumatology was planning degrees his prednisone from 5 mg to 10 which was not done because William [name] started developing fever and his rheumatologist wanted to make sure there was no infection. William [name] otherwise denies any chest pain shortness of breath nausea vomiting diarrhea. Has been having chronic nonproductive cough. Over the last few days William [name] also has been having lower abdominal pain. Denies any dysuria or discharges. The first 2 days of fever William [name] had any and retro-orbital pain which at this time is completely resolved and does not have any neck rigidity of pain. In the ER William [name]'s LFTs are found to be mildly elevated.  William [name] denies to have taking any new medication. Denies any insect bites.  Review of Systems: As presented in the history of presenting illness, rest negative.  Past Medical  History  Diagnosis Date  . Coronary disease   . Hypercholesterolemia   . Polymyalgia rheumatica   . Bronchitis   . Insomnia   . Tobacco abuse   . History of chest pain   . Hypertension    Past Surgical History  Procedure Laterality Date  . Cardiac catheterization  03/16/1999    ejection fraction 65%--After surgery pt. developed hives and itching  . Coronary artery bypass graft  03/19/1999   Social History:  reports that he has been smoking Cigars.  He does not have any smokeless tobacco history on file. He reports that he drinks about 0.6 ounces of alcohol per week. He reports that he does not use illicit drugs. Home. where does William [name] live-- Can do ADLs. Can William [name] participate in ADLs?  Allergies  Allergen Reactions  . Oxycodone Itching  . Iodine Itching  . Zocor (Simvastatin) Other (See Comments)    Pain    Family History  Problem Relation Age of Onset  . Pancreatic cancer Mother   . Bladder Cancer Father       Prior to Admission medications   Medication Sig Start Date End Date Taking? Authorizing Provider  albuterol (PROVENTIL HFA;VENTOLIN HFA) 108 (90 BASE) MCG/ACT inhaler Inhale 1-2 puffs into the lungs every 4 (four) hours as needed for wheezing or shortness of breath.   Yes Historical Provider, MD  amLODipine (NORVASC) 5 MG tablet Take 5 mg by mouth every evening.    Yes Historical Provider, MD  aspirin 81 MG tablet Take 81 mg by mouth every evening.    Yes Historical Provider,  MD  atorvastatin (LIPITOR) 40 MG tablet Take 40 mg by mouth every evening.   Yes Historical Provider, MD  doxycycline (VIBRAMYCIN) 100 MG capsule Take 100 mg by mouth 2 (two) times daily. Start date 09/12/12; Duration unknown to pt 09/12/12  Yes Shari A Upstill, PA-C  folic acid (FOLVITE) 1 MG tablet Take 1 mg by mouth daily.   Yes Historical Provider, MD  HYDROmorphone (DILAUDID) 2 MG tablet Take 1 tablet (2 mg total) by mouth every 4 (four) hours as needed for pain. 06/14/12  Yes Gavin Pound.  Ghim, MD  lisinopril (PRINIVIL,ZESTRIL) 40 MG tablet Take 40 mg by mouth daily.   Yes Historical Provider, MD  magnesium gluconate (MAGONATE) 500 MG tablet Take 500 mg by mouth every evening.    Yes Historical Provider, MD  methotrexate (RHEUMATREX) 2.5 MG tablet Take 22.5 mg by mouth once a week. Sundays   Yes Historical Provider, MD  Multiple Vitamin (MULTI-VITAMIN PO) Take 1 tablet by mouth daily.    Yes Historical Provider, MD  nitroGLYCERIN (NITROSTAT) 0.4 MG SL tablet Place 0.4 mg under the tongue every 5 (five) minutes as needed for chest pain.   Yes Historical Provider, MD  Omega-3 Fatty Acids (FISH OIL) 500 MG CAPS Take 2 capsules by mouth 2 (two) times daily.   Yes Historical Provider, MD  predniSONE (DELTASONE) 5 MG tablet Take 10 mg by mouth daily.    Yes Historical Provider, MD  Testosterone (FORTESTA) 10 MG/ACT (2%) GEL Place 1 application onto the skin daily.   Yes Historical Provider, MD   Physical Exam: Filed Vitals:   09/17/12 2130 09/17/12 2200 09/17/12 2227 09/17/12 2230  BP:  120/65 120/65 120/65  Pulse: 83 88 86 85  Temp:   100.7 F (38.2 C)   TempSrc:   Oral   Resp: 13 22 20 20   Weight:      SpO2: 95% 92% 92% 93%     General:  Well-developed well-nourished.  Eyes: Anicteric no pallor.  ENT: No discharge from ears eyes nose mouth.  Neck: No mass felt. No neck rigidity.  Cardiovascular: S1-S2 heard.  Respiratory: No rhonchi or crepitations.  Abdomen: Soft nontender bowel sounds present.  Skin: William [name] has extension scar on the back with mild open wound with no active discharge.  Musculoskeletal: No edema or joint effusion seen.  Psychiatric: Appears normal.  Neurologic: Alert awake oriented to time place and person. Moves all extremities.  Labs on Admission:  Basic Metabolic Panel:  Recent Labs Lab 09/12/12 1450 09/17/12 1945  NA 131* 132*  K 4.1 4.1  CL 101 99  CO2 22 25  GLUCOSE 124* 109*  BUN 18 13  CREATININE 0.88 1.10  CALCIUM 9.2  9.5   Liver Function Tests:  Recent Labs Lab 09/17/12 1945  AST 83*  ALT 151*  ALKPHOS 110  BILITOT 1.0  PROT 6.2  ALBUMIN 3.0*   No results found for this basename: LIPASE, AMYLASE,  in the last 168 hours No results found for this basename: AMMONIA,  in the last 168 hours CBC:  Recent Labs Lab 09/12/12 1450 09/17/12 1945  WBC 5.0 10.9*  NEUTROABS 3.4 6.7  HGB 15.7 15.7  HCT 45.2 44.2  MCV 93.8 93.6  PLT 120* 198   Cardiac Enzymes: No results found for this basename: CKTOTAL, CKMB, CKMBINDEX, TROPONINI,  in the last 168 hours  BNP (last 3 results) No results found for this basename: PROBNP,  in the last 8760 hours CBG: No results found for  this basename: GLUCAP,  in the last 168 hours  Radiological Exams on Admission: No results found.   Assessment/Plan Principal Problem:   Fever Active Problems:   Polymyalgia rheumatica   CAD (coronary artery disease)   Hyperlipidemia   1. Fever of unknown origin - at this time blood cultures and urine cultures have been ordered. Check sedimentation rate CK levels given his polymyalgia rheumatica. Repeat chest x-ray has been ordered which is pending. At this time I have not started William [name] on any antibiotics. His sedimentation rate is elevated significantly then I will start William [name] on prednisone dose to cover for giant cell arteritis. Further recommendations pending labs ordered. Check acute hepatitis panel given the mildly elevated LFTs. Since William [name] also has lower abdominal discomfort I have ordered CT abdomen and pelvis. 2. Mildly elevated LFTs - check acute hepatitis panel, ANA, CT abdomen and pelvis and closely follow LFTs. 3. Polymyalgia rheumatica - we will continue with prednisone 5 mg daily. Given his mildly elevated LFTs we will hold off methotrexate for now. 4. CAD status post CABG - denies any chest pain. 5. Hyperlipidemia - hold statins due to mildly elevated LFTs.  William [name] has had a recent skin excision done on  the back. The site does not have any obvious discharge.  Code Status: Full code.  Family Communication: William [name]'s wife at the bedside.  Disposition Plan: Admit to inpatient.    Kaedyn Belardo N. [facility] Pager [phone].  If 7PM-7AM, please contact night-coverage [email or web address] Password [facility] 09/17/2012, 11:25 PM

## 2012-09-17 NOTE — ED Notes (Signed)
PResents with Fever of unknown origin for 2 weeks. Fever as high as 102.0, he was seen here and discharged on 09/12/12. Pt has been to Primary care doctor and found to have elevated ALTs. He is alert and oriented. Afebrile at this time. Reports taking extra strength tylenol. C/o abdominal pain associated with nausea. Pain is located in lower abdomen.  Denies diarrhea.

## 2012-09-17 NOTE — ED Provider Notes (Signed)
History    CSN: [id number] Arrival date & time 09/17/12  1815  First MD Initiated Contact with Patient 09/17/12 2026     Chief Complaint  Patient presents with  . Fever   (Consider location/radiation/quality/duration/timing/severity/associated sxs/prior Treatment) Patient is a 62 y.o. male presenting with fever. The history is provided by the patient and the spouse.  Fever Max temp prior to arrival:  102 Temp source:  Oral Severity:  Severe Onset quality:  Sudden Duration:  8 days Timing:  Intermittent Progression:  Unchanged Chronicity:  New Relieved by:  Acetaminophen (But recurs) Worsened by:  Nothing tried Ineffective treatments:  None tried Associated symptoms: chills   Associated symptoms: no chest pain, no confusion, no congestion, no cough, no diarrhea, no dysuria, no ear pain, no headaches, no myalgias, no nausea, no rash, no rhinorrhea, no somnolence, no sore throat and no vomiting   Risk factors: immunosuppression (Patient's chronically on steroids for polymyalgia rheumatica) and recent surgery (Patient had a skin biopsy on his back 9 days ago)   Risk factors: no hx of cancer, no occupational exposure and no recent travel    Raj A Henion 62 y.o. presents at the recommendation of his PCP for fever of unknown origin. He's had multiple workups since the onset of fever 8 days ago. Chest x-ray is negative. UA negative. Recent work up again yesterday showed elevated LFTs. He was initially treated with doxycycline for a short period time without improvement. He was started on Bactrim 3 days ago. Past Medical History  Diagnosis Date  . Coronary disease   . Hypercholesterolemia   . Polymyalgia rheumatica   . Bronchitis   . Insomnia   . Tobacco abuse   . History of chest pain   . Hypertension    Past Surgical History  Procedure Laterality Date  . Cardiac catheterization  03/16/1999    ejection fraction 65%--After surgery pt. developed hives and itching  . Coronary  artery bypass graft  03/19/1999   Family History  Problem Relation Age of Onset  . Pancreatic cancer Mother   . Bladder Cancer Father    History  Substance Use Topics  . Smoking status: Current Some Day Smoker    Types: Cigars  . Smokeless tobacco: Not on file  . Alcohol Use: 0.6 oz/week    1 Cans of beer per week    Review of Systems  Constitutional: Positive for fever, chills, diaphoresis, activity change and fatigue. Negative for appetite change.  HENT: Negative for ear pain, congestion, sore throat, rhinorrhea, sneezing and trouble swallowing.   Eyes: Negative for pain and redness.  Respiratory: Negative for cough, choking, chest tightness, shortness of breath, wheezing and stridor.   Cardiovascular: Negative for chest pain and leg swelling.  Gastrointestinal: Negative for nausea, vomiting, diarrhea, constipation, blood in stool, abdominal distention and anal bleeding.  Genitourinary: Negative for dysuria.  Musculoskeletal: Negative for myalgias and back pain.  Skin: Negative for rash.  Neurological: Negative for dizziness, speech difficulty, weakness, light-headedness, numbness and headaches.  Hematological: Negative for adenopathy.  Psychiatric/Behavioral: Negative for confusion.    Allergies  Oxycodone; Iodine; and Zocor  Home Medications   Current Outpatient Rx  Name  Route  Sig  Dispense  Refill  . albuterol (PROVENTIL HFA;VENTOLIN HFA) 108 (90 BASE) MCG/ACT inhaler   Inhalation   Inhale 1-2 puffs into the lungs every 4 (four) hours as needed for wheezing or shortness of breath.         Marland Kitchen amLODipine (NORVASC) 5 MG  tablet   Oral   Take 5 mg by mouth every evening.          Marland Kitchen aspirin 81 MG tablet   Oral   Take 81 mg by mouth every evening.          Marland Kitchen atorvastatin (LIPITOR) 40 MG tablet   Oral   Take 40 mg by mouth every evening.         Marland Kitchen doxycycline (VIBRAMYCIN) 100 MG capsule   Oral   Take 100 mg by mouth 2 (two) times daily. Start date 09/12/12;  Duration unknown to pt         . folic acid (FOLVITE) 1 MG tablet   Oral   Take 1 mg by mouth daily.         Marland Kitchen HYDROmorphone (DILAUDID) 2 MG tablet   Oral   Take 1 tablet (2 mg total) by mouth every 4 (four) hours as needed for pain.   30 tablet   0   . lisinopril (PRINIVIL,ZESTRIL) 40 MG tablet   Oral   Take 40 mg by mouth daily.         . magnesium gluconate (MAGONATE) 500 MG tablet   Oral   Take 500 mg by mouth every evening.          . methotrexate (RHEUMATREX) 2.5 MG tablet   Oral   Take 22.5 mg by mouth once a week. Sundays         . Multiple Vitamin (MULTI-VITAMIN PO)   Oral   Take 1 tablet by mouth daily.          . nitroGLYCERIN (NITROSTAT) 0.4 MG SL tablet   Sublingual   Place 0.4 mg under the tongue every 5 (five) minutes as needed for chest pain.         . Omega-3 Fatty Acids (FISH OIL) 500 MG CAPS   Oral   Take 2 capsules by mouth 2 (two) times daily.         . predniSONE (DELTASONE) 5 MG tablet   Oral   Take 10 mg by mouth daily.          . Testosterone (FORTESTA) 10 MG/ACT (2%) GEL   Transdermal   Place 1 application onto the skin daily.          BP 120/65  Pulse 85  Temp(Src) 100.7 F (38.2 C) (Oral)  Resp 20  Wt 242 lb (109.77 kg)  BMI 33.77 kg/m2  SpO2 93% Physical Exam  Nursing note and vitals reviewed. Constitutional: He is oriented to person, place, and time. He appears well-developed and well-nourished. No distress.  HENT:  Head: Normocephalic and atraumatic.  Eyes: Conjunctivae and EOM are normal. Right eye exhibits no discharge. Left eye exhibits no discharge.  Neck: Normal range of motion. Neck supple. No tracheal deviation present.  Cardiovascular: Normal rate, regular rhythm and normal heart sounds.  Exam reveals no friction rub.   No murmur heard. Pulmonary/Chest: Effort normal and breath sounds normal. No stridor. No respiratory distress. He has no wheezes. He has no rales. He exhibits no tenderness.   Abdominal: Soft. He exhibits no distension. There is no tenderness. There is no rebound and no guarding.  Neurological: He is alert and oriented to person, place, and time.  Skin: Skin is warm. Lesion (4 cm wound with 7  sutures apparently patient's mid back. No purulence, erythema, or tenderness.) noted.  Psychiatric: He has a normal mood and affect.    ED Course  Procedures (including critical care time) Labs Reviewed  CBC WITH DIFFERENTIAL - Abnormal; Notable for the following:    WBC 10.9 (*)    All other components within normal limits  COMPREHENSIVE METABOLIC PANEL - Abnormal; Notable for the following:    Sodium 132 (*)    Glucose, Bld 109 (*)    Albumin 3.0 (*)    AST 83 (*)    ALT 151 (*)    GFR calc non Af Amer 70 (*)    GFR calc Af Amer 81 (*)    All other components within normal limits  URINALYSIS, ROUTINE W REFLEX MICROSCOPIC - Abnormal; Notable for the following:    Color, Urine ORANGE (*)    APPearance CLOUDY (*)    Specific Gravity, Urine 1.040 (*)    Hgb urine dipstick SMALL (*)    Bilirubin Urine SMALL (*)    Ketones, ur 15 (*)    Protein, ur 30 (*)    All other components within normal limits  URINE MICROSCOPIC-ADD ON - Abnormal; Notable for the following:    Crystals CA OXALATE CRYSTALS (*)    All other components within normal limits  CULTURE, BLOOD (ROUTINE X 2)  CULTURE, BLOOD (ROUTINE X 2)  URINE CULTURE  SEDIMENTATION RATE  ANA  POCT LACTIC ACID (LACTATE)   Results for orders placed during the hospital encounter of 09/17/12  CBC WITH DIFFERENTIAL      Result Value Range   WBC 10.9 (*) 4.0 - 10.5 K/uL   RBC 4.72  4.22 - 5.81 MIL/uL   Hemoglobin 15.7  13.0 - 17.0 g/dL   HCT 96.0  45.4 - 09.8 %   MCV 93.6  78.0 - 100.0 fL   MCH 33.3  26.0 - 34.0 pg   MCHC 35.5  30.0 - 36.0 g/dL   RDW 11.9  14.7 - 82.9 %   Platelets 198  150 - 400 K/uL   Neutrophils Relative % 61  43 - 77 %   Lymphocytes Relative 27  12 - 46 %   Monocytes Relative 9  3 -  12 %   Eosinophils Relative 3  0 - 5 %   Basophils Relative 0  0 - 1 %   Band Neutrophils 0  0 - 10 %   Metamyelocytes Relative 0     Myelocytes 0     Promyelocytes Absolute 0     Blasts 0     nRBC 0  0 /100 WBC   Neutro Abs 6.7  1.7 - 7.7 K/uL   Lymphs Abs 2.9  0.7 - 4.0 K/uL   Monocytes Absolute 1.0  0.1 - 1.0 K/uL   Eosinophils Absolute 0.3  0.0 - 0.7 K/uL   Basophils Absolute 0.0  0.0 - 0.1 K/uL   WBC Morphology ATYPICAL LYMPHOCYTES    COMPREHENSIVE METABOLIC PANEL      Result Value Range   Sodium 132 (*) 135 - 145 mEq/L   Potassium 4.1  3.5 - 5.1 mEq/L   Chloride 99  96 - 112 mEq/L   CO2 25  19 - 32 mEq/L   Glucose, Bld 109 (*) 70 - 99 mg/dL   BUN 13  6 - 23 mg/dL   Creatinine, Ser 5.62  0.50 - 1.35 mg/dL   Calcium 9.5  8.4 - 13.0 mg/dL   Total Protein 6.2  6.0 - 8.3 g/dL   Albumin 3.0 (*) 3.5 - 5.2 g/dL   AST 83 (*) 0 - 37 U/L   ALT  151 (*) 0 - 53 U/L   Alkaline Phosphatase 110  39 - 117 U/L   Total Bilirubin 1.0  0.3 - 1.2 mg/dL   GFR calc non Af Amer 70 (*) >90 mL/min   GFR calc Af Amer 81 (*) >90 mL/min  URINALYSIS, ROUTINE W REFLEX MICROSCOPIC      Result Value Range   Color, Urine ORANGE (*) YELLOW   APPearance CLOUDY (*) CLEAR   Specific Gravity, Urine 1.040 (*) 1.005 - 1.030   pH 5.5  5.0 - 8.0   Glucose, UA NEGATIVE  NEGATIVE mg/dL   Hgb urine dipstick SMALL (*) NEGATIVE   Bilirubin Urine SMALL (*) NEGATIVE   Ketones, ur 15 (*) NEGATIVE mg/dL   Protein, ur 30 (*) NEGATIVE mg/dL   Urobilinogen, UA 1.0  0.0 - 1.0 mg/dL   Nitrite NEGATIVE  NEGATIVE   Leukocytes, UA NEGATIVE  NEGATIVE  URINE MICROSCOPIC-ADD ON      Result Value Range   Squamous Epithelial / LPF RARE  RARE   WBC, UA 0-2  <3 WBC/hpf   RBC / HPF 0-2  <3 RBC/hpf   Bacteria, UA RARE  RARE   Crystals CA OXALATE CRYSTALS (*) NEGATIVE   Urine-Other MUCOUS PRESENT      No results found. 1. Fever   2. CAD (coronary artery disease)   3. Hyperlipidemia   4. Polymyalgia rheumatica     MDM   Davier Tramell Yazdani 62 y.o. with a history of polymyalgia rheumatica, coronary artery disease and hypertension he presents emergency Department recommendation of his PCP to be admitted for fever of unknown origin. Workup thus far has revealed no evidence of infectious pulmonary process, urinary, or soft tissue source. He is febrile here with a max temperature of 102. No acute distress. Vital signs are otherwise stable. Biopsy site noted above appreciated. No evidence of underlying infection. Considering sutures were placed 10 days ago sutures were removed. The center of the wound dehisced do to significant amount of tension appreciated on the sutures. On reevaluation of the wound there is again no evidence of purulence, erythema, or underlying infection. On workup here no identification of underlying infection. Considering the patient is chronically on steroids, he was started on broad-spectrum antibiotics to include Pseudomonas coverage. This included vancomycin and Zosyn. Patient was admitted for fever of unknown origin valuation. Labs, previous labs, imaging, previous imaging, previous documentation reviewed. I discussed this patient's care with my attending, Dr. Freida Busman.  Sena Hitch, MD 09/17/12 [phone]

## 2012-09-18 ENCOUNTER — Encounter (HOSPITAL_COMMUNITY): Payer: Self-pay | Admitting: General Practice

## 2012-09-18 ENCOUNTER — Inpatient Hospital Stay (HOSPITAL_COMMUNITY)

## 2012-09-18 DIAGNOSIS — I10 Essential (primary) hypertension: Secondary | ICD-10-CM

## 2012-09-18 LAB — CBC WITH DIFFERENTIAL/PLATELET
Basophils Absolute: 0.3 10*3/uL — ABNORMAL HIGH (ref 0.0–0.1)
Eosinophils Absolute: 0.1 10*3/uL (ref 0.0–0.7)
HCT: 40 % (ref 39.0–52.0)
Lymphocytes Relative: 37 % (ref 12–46)
Monocytes Relative: 10 % (ref 3–12)
Neutro Abs: 4.6 10*3/uL (ref 1.7–7.7)
Neutrophils Relative %: 49 % (ref 43–77)
Platelets: 174 10*3/uL (ref 150–400)
RDW: 13.6 % (ref 11.5–15.5)
WBC: 9.3 10*3/uL (ref 4.0–10.5)

## 2012-09-18 LAB — BASIC METABOLIC PANEL
BUN: 12 mg/dL (ref 6–23)
CO2: 22 mEq/L (ref 19–32)
Calcium: 8.6 mg/dL (ref 8.4–10.5)
Creatinine, Ser: 1.05 mg/dL (ref 0.50–1.35)
GFR calc Af Amer: 85 mL/min — ABNORMAL LOW (ref >90)

## 2012-09-18 LAB — PROCALCITONIN: Procalcitonin: 0.33 ng/mL

## 2012-09-18 LAB — ACETAMINOPHEN LEVEL: Acetaminophen (Tylenol), Serum: <15 ug/mL (ref 10–30)

## 2012-09-18 LAB — HEPATIC FUNCTION PANEL
Alkaline Phosphatase: 96 U/L (ref 39–117)
Bilirubin, Direct: 0.2 mg/dL (ref 0.0–0.3)
Indirect Bilirubin: 0.8 mg/dL (ref 0.3–0.9)
Total Protein: 5.5 g/dL — ABNORMAL LOW (ref 6.0–8.3)

## 2012-09-18 LAB — MRSA PCR SCREENING: MRSA by PCR: NEGATIVE

## 2012-09-18 LAB — CK TOTAL AND CKMB (NOT AT ARMC)
CK, MB: 1.9 ng/mL (ref 0.3–4.0)
Relative Index: INVALID (ref 0.0–2.5)

## 2012-09-18 LAB — URINE CULTURE

## 2012-09-18 LAB — HEPATITIS PANEL, ACUTE
HCV Ab: NEGATIVE
Hepatitis B Surface Ag: NEGATIVE

## 2012-09-18 MED ORDER — NITROGLYCERIN 0.4 MG SL SUBL: 0.4000 mg | SUBLINGUAL_TABLET | SUBLINGUAL | Status: DC | PRN

## 2012-09-18 MED ORDER — AMLODIPINE BESYLATE 5 MG PO TABS
5.0000 mg | ORAL_TABLET | Freq: Every evening | ORAL | Status: DC
  Administered 2012-09-18 – 2012-09-21 (×4): 5 mg via ORAL
  Filled 2012-09-18 (×6): qty 1

## 2012-09-18 MED ORDER — OMEGA-3-ACID ETHYL ESTERS 1 G PO CAPS
1.0000 g | ORAL_CAPSULE | Freq: Two times a day (BID) | ORAL | Status: DC
  Administered 2012-09-18 – 2012-09-29 (×23): 1 g via ORAL
  Filled 2012-09-18 (×24): qty 1

## 2012-09-18 MED ORDER — SODIUM CHLORIDE 0.9 % IJ SOLN
3.0000 mL | Freq: Two times a day (BID) | INTRAMUSCULAR | Status: DC
  Administered 2012-09-18 – 2012-09-29 (×15): 3 mL via INTRAVENOUS

## 2012-09-18 MED ORDER — ONDANSETRON HCL 4 MG/2ML IJ SOLN
4.0000 mg | Freq: Four times a day (QID) | INTRAMUSCULAR | Status: DC | PRN
  Administered 2012-09-20: 4 mg via INTRAVENOUS
  Filled 2012-09-18: qty 2

## 2012-09-18 MED ORDER — ASPIRIN 81 MG PO CHEW
81.0000 mg | CHEWABLE_TABLET | Freq: Every evening | ORAL | Status: DC
  Administered 2012-09-18 – 2012-09-23 (×6): 81 mg via ORAL
  Filled 2012-09-18 (×6): qty 1

## 2012-09-18 MED ORDER — ONDANSETRON HCL 4 MG PO TABS
4.0000 mg | ORAL_TABLET | Freq: Four times a day (QID) | ORAL | Status: DC | PRN
  Administered 2012-09-20: 4 mg via ORAL
  Filled 2012-09-18: qty 1

## 2012-09-18 MED ORDER — ALBUTEROL SULFATE HFA 108 (90 BASE) MCG/ACT IN AERS: 1.0000 | INHALATION_SPRAY | RESPIRATORY_TRACT | Status: DC | PRN

## 2012-09-18 MED ORDER — PREDNISONE 5 MG PO TABS
5.0000 mg | ORAL_TABLET | Freq: Every day | ORAL | Status: DC
  Administered 2012-09-18 – 2012-09-29 (×12): 5 mg via ORAL
  Filled 2012-09-18 (×13): qty 1

## 2012-09-18 MED ORDER — LISINOPRIL 40 MG PO TABS
40.0000 mg | ORAL_TABLET | Freq: Every day | ORAL | Status: DC
  Administered 2012-09-18 – 2012-09-23 (×6): 40 mg via ORAL
  Filled 2012-09-18 (×6): qty 1

## 2012-09-18 MED ORDER — FOLIC ACID 1 MG PO TABS
1.0000 mg | ORAL_TABLET | Freq: Every day | ORAL | Status: DC
  Administered 2012-09-18 – 2012-09-29 (×12): 1 mg via ORAL
  Filled 2012-09-18 (×12): qty 1

## 2012-09-18 MED ORDER — ACETAMINOPHEN 325 MG PO TABS
650.0000 mg | ORAL_TABLET | Freq: Four times a day (QID) | ORAL | Status: DC | PRN
  Administered 2012-09-18 – 2012-09-27 (×22): 650 mg via ORAL
  Filled 2012-09-18 (×3): qty 2
  Filled 2012-09-18: qty 1
  Filled 2012-09-18 (×6): qty 2
  Filled 2012-09-18: qty 1
  Filled 2012-09-18 (×12): qty 2

## 2012-09-18 MED ORDER — HYDROMORPHONE HCL 2 MG PO TABS
2.0000 mg | ORAL_TABLET | ORAL | Status: DC | PRN
  Administered 2012-09-20: 2 mg via ORAL
  Filled 2012-09-18: qty 1

## 2012-09-18 MED ORDER — SODIUM CHLORIDE 0.9 % IV SOLN
INTRAVENOUS | Status: AC
  Administered 2012-09-18: 08:00:00 via INTRAVENOUS

## 2012-09-18 MED ORDER — ACETAMINOPHEN 650 MG RE SUPP: 650.0000 mg | Freq: Four times a day (QID) | RECTAL | Status: DC | PRN

## 2012-09-18 NOTE — Progress Notes (Signed)
[facility] PROGRESS NOTE  William [name] ZOX:[id number] DOB: 1950/06/11 DOA: 09/17/2012 PCP: Vivien Presto, MD  Assessment/Plan: 1. FUO: Patient continues to have chills/rigors proximally every 6 hours. Patient is immunocompromised secondary to being on chronic steroids for PMR area will begin a FU0 workup. Obtain ESR, CRP LDH, TB Quantriferon test, Serum protein electrophoresis, creatine kinase, RF, ANA, we'll also obtain CT scan of chest and head.. Will consider LP. Transfer to ward 2900 so we can use Cooling Blanket. Hold ALL ABx    2. PMR; Continue Home meds  3.  Rash; Pt unsure when arose. 2dary to PMR vs Drug RXn vs Infx  4. HTN: Cont. Home meds      Code Status: Full Family Communication: Wife and Daughter present for care Plan Disposition Plan: ?     HPI/Subjective: HPI: William [name] is a 62 y.o. WM PMHx polymyalgia rheumatica, CAD status post CABG, hyperlipidemia and hypertension has been experiencing fever and chills over the last one week. Patient had come to the ER 2 days after his symptoms had started and was placed on doxycycline. Patient on doxycycline for 2 days after which his fever subsided and he had gone to [place]. Over there he started spiking fever again and he had gone to the ER and was placed on Bactrim empirically. As per the family workup done over there was negative for any source. Despite taking Bactrim for last 2-3 days patient still has fever and had come to the ER today. Patient states that she has had a skin biopsy and excision done prior to this fever episode. Results of the biopsy are pending. Prior to the episode also patient has been having some joint aches and his rheumatology was planning degrees his prednisone from 5 mg to 10 which was not done because patient started developing fever and his rheumatologist wanted to make sure there was no infection. Patient otherwise denies any chest pain shortness of breath nausea vomiting diarrhea.  Has been having chronic nonproductive cough. Over the last few days patient also has been having lower abdominal pain. Denies any dysuria or discharges. The first 2 days of fever patient had any and retro-orbital pain which at this time is completely resolved and does not have any neck rigidity of pain. In the ER patient's LFTs are found to be mildly elevated. Patient denies to have taking any new medication. Denies any insect bites. However patient does camp in fish on further questioning stay had 4 days of doxycycline, 1 day of Bactrim, one day of Zosyn/vancomycin (administered on 09/17/2012).    Ct Abd and Pelvis 09/18/2013 1. No acute abnormality seen within the abdomen or pelvis.  2. Nonspecific perinephric stranding noted bilaterally; small  bilateral parapelvic renal cysts seen.  3. Scattered calcification along the abdominal aorta and its  branches.  4. Small left inguinal hernia, containing only fat.     Objective: Filed Vitals:   09/18/12 0847 09/18/12 1011 09/18/12 1016 09/18/12 1334  BP:  125/64  131/69  Pulse:    81  Temp: 101.7 F (38.7 C)  97.8 F (36.6 C) 101.4 F (38.6 C)  TempSrc: Oral   Oral  Resp:    20  Height:      Weight:      SpO2:    98%    Intake/Output Summary (Last 24 hours) at 09/18/12 1347 Last data filed at 09/18/12 1334  Gross per 24 hour  Intake    720 ml  Output   1700  ml  Net   -980 ml   Filed Weights   09/17/12 1823 09/18/12 0025  Weight: 109.77 kg (242 lb) 111.585 kg (246 lb)   FHx; mother; lung cancer/ gastric cancer/Pnacreas Ca          FATHER; BLADDER cA    Exam:   General:  Alert, in Rigors  Cardiovascular: RR Tachycardic, (-) M/R/G, DP/PT pulse 2+ bilat  Respiratory: CTA Bilat  Abdomen: Soft, NT, ND, (+) BS  Musculoskeletal: (-) Pedal Edema Bilat   Data Reviewed: Basic Metabolic Panel:  Recent Labs Lab 09/12/12 1450 09/17/12 1945 09/18/12 0550  NA 131* 132* 131*  K 4.1 4.1 4.5  CL 101 99 99  CO2 22 25 22    GLUCOSE 124* 109* 105*  BUN 18 13 12   CREATININE 0.88 1.10 1.05  CALCIUM 9.2 9.5 8.6   Liver Function Tests:  Recent Labs Lab 09/17/12 1945 09/18/12 0550  AST 83* 79*  ALT 151* 136*  ALKPHOS 110 96  BILITOT 1.0 1.0  PROT 6.2 5.5*  ALBUMIN 3.0* 2.7*   No results found for this basename: LIPASE, AMYLASE,  in the last 168 hours No results found for this basename: AMMONIA,  in the last 168 hours CBC:  Recent Labs Lab 09/12/12 1450 09/17/12 1945 09/18/12 0550  WBC 5.0 10.9* 9.3  NEUTROABS 3.4 6.7 4.6  HGB 15.7 15.7 13.6  HCT 45.2 44.2 40.0  MCV 93.8 93.6 94.1  PLT 120* 198 174   Cardiac Enzymes: No results found for this basename: CKTOTAL, CKMB, CKMBINDEX, TROPONINI,  in the last 168 hours BNP (last 3 results) No results found for this basename: PROBNP,  in the last 8760 hours CBG: No results found for this basename: GLUCAP,  in the last 168 hours  No results found for this or any previous visit (from the past 240 hour(s)).   Studies: Ct Abdomen Pelvis Wo Contrast  09/18/2012   *RADIOLOGY REPORT*  Clinical Data: Lower abdominal pain and fever.  CT ABDOMEN AND PELVIS WITHOUT CONTRAST  Technique:  Multidetector CT imaging of the abdomen and pelvis was performed following the standard protocol without intravenous contrast.  Comparison: MRI of the lumbar spine and right hip performed 03/17/2010  Findings: The visualized lung bases are clear.  The liver and spleen are unremarkable in appearance.  The gallbladder is within normal limits.  The pancreas and adrenal glands are unremarkable.  Nonspecific perinephric stranding is noted bilaterally.  Small bilateral parapelvic renal cysts are seen.  Calcifications at the right renal hilum appear to be vascular in nature.  No free fluid is identified.  The small bowel is unremarkable in appearance.  The stomach is within normal limits.  No acute vascular abnormalities are seen.  Scattered calcification is noted along the abdominal aorta  and its branches.  A small focus of fat adjacent to the third segment of the duodenum may reflect a small lipoma or a partially calcified epiploic appendage.  The appendix is normal in caliber and contains contrast, without evidence for appendicitis.  Contrast progresses to the level of the distal descending colon.  The colon is unremarkable in appearance.  The bladder is mildly distended and grossly unremarkable in appearance.  A small left inguinal hernia is seen, containing only fat.  The prostate remains normal in size.  No inguinal lymphadenopathy is seen.  No acute osseous abnormalities are identified.  Vacuum phenomenon is noted at L4-L5 and L5-S1.  IMPRESSION:  1.  No acute abnormality seen within the abdomen or  pelvis. 2.  Nonspecific perinephric stranding noted bilaterally; small bilateral parapelvic renal cysts seen. 3.  Scattered calcification along the abdominal aorta and its branches. 4.  Small left inguinal hernia, containing only fat.   Original Report Authenticated By: Tonia Ghent, M.D.   Dg Chest 2 View  09/17/2012   *RADIOLOGY REPORT*  Clinical Data: Fever for 8 days.  History of smoking.  CHEST - 2 VIEW  Comparison: Chest radiograph performed 09/12/2012.  Findings: The lungs are well-aerated and clear.  There is no evidence of focal opacification, pleural effusion or pneumothorax.  The heart is normal in size; the patient is status post median sternotomy, with evidence of prior CABG.  No acute osseous abnormalities are seen.  IMPRESSION: No acute cardiopulmonary process seen.   Original Report Authenticated By: Tonia Ghent, M.D.    Scheduled Meds: . amLODipine  5 mg Oral QPM  . aspirin  81 mg Oral QPM  . folic acid  1 mg Oral Daily  . lisinopril  40 mg Oral Daily  . omega-3 acid ethyl esters  1 g Oral BID  . predniSONE  5 mg Oral Q breakfast  . sodium chloride  3 mL Intravenous Q12H   Continuous Infusions: . sodium chloride 100 mL/hr at 09/18/12 1610    Principal Problem:    Fever Active Problems:   Polymyalgia rheumatica   CAD (coronary artery disease)   Hyperlipidemia    Time spent: 90 min    WOODS, CURTIS, J  [facility] Pager [phone]. If 7PM-7AM, please contact night-coverage at [email or web address], password [facility] 09/18/2012, 1:47 PM  LOS: 1 day

## 2012-09-18 NOTE — Progress Notes (Signed)
The patient's highest temperature overnight was 102.4.  Tylenol was given to the patient.  His temperature dropped to 100.7 approximately an hour after giving the medication.

## 2012-09-18 NOTE — Progress Notes (Signed)
Pt transferred to CT by RN on monitor;

## 2012-09-18 NOTE — Progress Notes (Signed)
The patient arrived to [id number].  He was oriented to the unit and placed on telemetry.  VS were taken and the patient was assessed.  An admission history was obtained.  The call bell was explained and placed within reach.  He does not have any complaints of pain at this time.  His wife is at the bedside.

## 2012-09-18 NOTE — Progress Notes (Addendum)
Notified that pt had A. Flutter, pt asymptomatic.  EKG showed ST 132.  Dr.C.  Woods reviewed EKG and instructed will f/u.  Amanda Pea, Charity fundraiser.

## 2012-09-18 NOTE — Progress Notes (Signed)
Report called to [facility], RN r/t pt tx to 2923.  Verbalized understanding.  Pt notified and family members at bedside.  Amanda Pea, Charity fundraiser.

## 2012-09-18 NOTE — Progress Notes (Signed)
Cooling blanket applied.

## 2012-09-18 NOTE — Progress Notes (Signed)
Utilization review completed. Christene Pounds, RN, BSN. 

## 2012-09-18 NOTE — Progress Notes (Signed)
Verify with CN Courtney if pt had order for [employer] he had an order.   William Bandy, RN called and informed her that pt has order and to place on isolation.  Toni Amend, RN spoke with pt's primary nurse.  Dr. Joseph Art notified that pt had an order and stated that he did has one.  Transfer unit made aware.  Amanda Pea, Charity fundraiser.

## 2012-09-18 NOTE — Progress Notes (Signed)
Pt transferred to 2923 asked by primary nurse if isolation room (droplet ) needed.  Instructed not ordered and notified will asked Dr. Lucretia Roers.  Dr. Lucretia Roers called and stated pt does not need to be on any isolation at this time.  Nikki notified as well as Press photographer on 2900. And MD wants them to initiate a cooling blanket.  Attempted cooling blanket on the floor prior to tx informed by Toni Amend, RN CN that House coverage says not t used  the floor.  Dr.  Lucretia Roers aware.  Amanda Pea, Charity fundraiser.

## 2012-09-19 ENCOUNTER — Encounter (HOSPITAL_COMMUNITY): Payer: Self-pay | Admitting: Internal Medicine

## 2012-09-19 DIAGNOSIS — R748 Abnormal levels of other serum enzymes: Secondary | ICD-10-CM

## 2012-09-19 DIAGNOSIS — I4892 Unspecified atrial flutter: Secondary | ICD-10-CM

## 2012-09-19 DIAGNOSIS — R7401 Elevation of levels of liver transaminase levels: Secondary | ICD-10-CM

## 2012-09-19 DIAGNOSIS — D696 Thrombocytopenia, unspecified: Secondary | ICD-10-CM

## 2012-09-19 DIAGNOSIS — I10 Essential (primary) hypertension: Secondary | ICD-10-CM | POA: Diagnosis present

## 2012-09-19 HISTORY — DX: Unspecified atrial flutter: I48.92

## 2012-09-19 LAB — COMPREHENSIVE METABOLIC PANEL
ALT: 149 U/L — ABNORMAL HIGH (ref 0–53)
AST: 75 U/L — ABNORMAL HIGH (ref 0–37)
CO2: 21 mEq/L (ref 19–32)
Chloride: 97 mEq/L (ref 96–112)
Creatinine, Ser: 0.88 mg/dL (ref 0.50–1.35)
GFR calc Af Amer: >90 mL/min (ref >90)
GFR calc non Af Amer: 90 mL/min — ABNORMAL LOW (ref >90)
Glucose, Bld: 151 mg/dL — ABNORMAL HIGH (ref 70–99)
Total Bilirubin: 1.1 mg/dL (ref 0.3–1.2)

## 2012-09-19 LAB — PROCALCITONIN: Procalcitonin: 1.02 ng/mL

## 2012-09-19 LAB — CK TOTAL AND CKMB (NOT AT ARMC): Total CK: 111 U/L (ref 7–232)

## 2012-09-19 LAB — LACTATE DEHYDROGENASE: LDH: 466 U/L — ABNORMAL HIGH (ref 94–250)

## 2012-09-19 MED ORDER — DOXYCYCLINE HYCLATE 100 MG PO TABS
100.0000 mg | ORAL_TABLET | Freq: Two times a day (BID) | ORAL | Status: DC
  Filled 2012-09-19 (×2): qty 1

## 2012-09-19 MED ORDER — DOCUSATE SODIUM 100 MG PO CAPS
100.0000 mg | ORAL_CAPSULE | Freq: Two times a day (BID) | ORAL | Status: DC
  Administered 2012-09-20 – 2012-09-25 (×13): 100 mg via ORAL
  Filled 2012-09-19 (×21): qty 1

## 2012-09-19 MED ORDER — DOXYCYCLINE HYCLATE 100 MG IV SOLR
100.0000 mg | Freq: Two times a day (BID) | INTRAVENOUS | Status: DC
  Administered 2012-09-19 – 2012-09-21 (×4): 100 mg via INTRAVENOUS
  Filled 2012-09-19 (×5): qty 100

## 2012-09-19 MED ORDER — ALUM & MAG HYDROXIDE-SIMETH 200-200-20 MG/5ML PO SUSP
30.0000 mL | ORAL | Status: DC | PRN
  Administered 2012-09-19 – 2012-09-21 (×4): 30 mL via ORAL
  Filled 2012-09-19 (×3): qty 30

## 2012-09-19 MED ORDER — LABETALOL HCL 100 MG PO TABS
100.0000 mg | ORAL_TABLET | Freq: Two times a day (BID) | ORAL | Status: DC
  Administered 2012-09-19 – 2012-09-24 (×11): 100 mg via ORAL
  Filled 2012-09-19 (×12): qty 1

## 2012-09-19 MED ORDER — BACITRACIN ZINC 500 UNIT/GM EX OINT
TOPICAL_OINTMENT | Freq: Every day | CUTANEOUS | Status: DC
  Administered 2012-09-19 – 2012-09-29 (×9): via TOPICAL
  Filled 2012-09-19: qty 15

## 2012-09-19 MED ORDER — ALUM & MAG HYDROXIDE-SIMETH 200-200-20 MG/5ML PO SUSP: 30.0000 mL | ORAL | Status: DC | PRN

## 2012-09-19 NOTE — Consult Note (Addendum)
[facility]  Total days of antibiotics 1        Day 1 day of piptazo and vanco on 7/17               Reason for Consult: fever of unknown origin    Referring Physician: woods  Principal Problem:   Fever Active Problems:   Polymyalgia rheumatica   CAD (coronary artery disease)   Hyperlipidemia   HTN (hypertension)   Atrial flutter   Elevated liver enzymes    HPI: William [name] is a 62 y.o. male with hx of RA on methotrexate 9mg  weekly and 5mg  pred but also has dx of polymyalgia rheumatica, CAD s/p CABG, HLD and HTN has been experiencing fever , rigors, and chills since 09/09/12. 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 Patient on doxycycline for 2 days after which his fever subsided briefly but then recurred while he was on vacation in [place]. Since he had ongoing spiking fever,rigors, malaise, poor appetite and he had gone to the ER and was placed on Bactrim empirically.Despite taking Bactrim for last 2-3 days patient still has fever;thus represents to the Ed on 7/17.Prior to the episode also patient has been having some joint aches and his rheumatology, Dr. Nickola Major, was planning increase his prednisone from 5 mg to 10 which was not done because patient started developing fever and concern for infection. Patient otherwise denies any chest pain shortness of breath nausea vomiting diarrhea. Has been having chronic nonproductive cough. Over the last few days patient also has been having lower abdominal pain. Denies any dysuria. He has had intermittent headache. He has had fevers up to 102.9 and episodes of afib/aflutter while febrile. Previously to becoming ill. He does spend time outdoors, however does not recall any insect bite  Past Medical History  Diagnosis Date  . Coronary disease   .  Hypercholesterolemia   . Polymyalgia rheumatica   . Bronchitis   . Insomnia   . Tobacco abuse   . History of chest pain   . Hypertension   . Coronary artery disease   . Myocardial infarction   . Shortness of breath   . Pneumonia   . Headache(784.0)   . Atrial flutter 09/19/2012    Allergies:  Allergies  Allergen Reactions  . Oxycodone Itching  . Iodine Itching  . Zocor (Simvastatin) Other (See Comments)    Pain    MEDICATIONS: . amLODipine  5 mg Oral QPM  . aspirin  81 mg Oral QPM  . bacitracin   Topical Daily  . doxycycline (VIBRAMYCIN) IV  100 mg Intravenous Q12H  . folic acid  1 mg Oral Daily  . labetalol  100 mg Oral BID  . lisinopril  40 mg Oral Daily  . omega-3 acid ethyl esters  1 g Oral BID  . predniSONE  5 mg Oral Q breakfast  . sodium chloride  3 mL Intravenous Q12H    History  Substance Use Topics  . Smoking status: Current Some Day Smoker    Types: Cigars  . Smokeless tobacco: Not on file  . Alcohol Use: 0.6 oz/week    1 Cans of beer per week    Family History  Problem Relation Age of Onset  . Pancreatic cancer Mother   . Bladder Cancer Father  Review of Systems  Constitutional: positive for fever, chills, diaphoresis, activity change, appetite change, fatigue but no unexpected weight change.  HENT: Negative for congestion, sore throat, rhinorrhea, sneezing, trouble swallowing and sinus pressure.  Eyes: Negative for photophobia and visual disturbance.  Respiratory: Negative for cough, chest tightness, shortness of breath, wheezing and stridor.  Cardiovascular: Negative for chest pain, palpitations and leg swelling.  Gastrointestinal: Negative for nausea, vomiting, abdominal pain, diarrhea, constipation, blood in stool, abdominal distention and anal bleeding.  Genitourinary: Negative for dysuria, hematuria, flank pain and difficulty urinating.  Musculoskeletal: Negative for myalgias, back pain, joint swelling, arthralgias and gait problem.    Skin: Negative for color change, pallor, rash and wound.  Neurological: Negative for dizziness, tremors, weakness and light-headedness.  Hematological: Negative for adenopathy. Does not bruise/bleed easily.  Psychiatric/Behavioral: Negative for behavioral problems, confusion, sleep disturbance, dysphoric mood, decreased concentration and agitation.     OBJECTIVE: Temp:  [99.4 F (37.4 C)-102.9 F (39.4 C)] 99.4 F (37.4 C) (07/19 1214) Pulse Rate:  [70-105] 70 (07/19 1214) Resp:  [16-30] 18 (07/19 1214) BP: (96-146)/(44-92) 100/53 mmHg (07/19 1214) SpO2:  [92 %-98 %] 98 % (07/19 1214) Physical Exam  Constitutional: He is oriented to person, place, and time. He appears well-developed and well-nourished. No distress.  HENT:  Mouth/Throat: Oropharynx is clear and moist. No oropharyngeal exudate.  Cardiovascular: Normal rate, regular rhythm and normal heart sounds. Exam reveals no gallop and no friction rub.  No murmur heard.  Pulmonary/Chest: Effort normal and breath sounds normal. No respiratory distress. He has no wheezes.  Abdominal: Soft. Bowel sounds are normal. He exhibits no distension. There is no tenderness.  Lymphadenopathy:  He has no cervical adenopathy.  Neurological: He is alert and oriented to person, place, and time.  Skin: Skin is warm and dry. No rash noted. No erythema.  Psychiatric: He has a normal mood and affect. His behavior is normal.    LABS: Results for orders placed during the hospital encounter of 09/17/12 (from the past 48 hour(s))  CULTURE, BLOOD (ROUTINE X 2)     Status: None   Collection Time    09/17/12  7:40 PM      Result Value Range   Specimen Description BLOOD LEFT ANTECUBITAL     Special Requests BOTTLES DRAWN AEROBIC AND ANAEROBIC 10CC     Culture  Setup Time 09/18/2012 02:01     Culture       Value:        BLOOD CULTURE RECEIVED NO GROWTH TO DATE CULTURE WILL BE HELD FOR 5 DAYS BEFORE ISSUING A FINAL NEGATIVE REPORT   Report Status  PENDING    CBC WITH DIFFERENTIAL     Status: Abnormal   Collection Time    09/17/12  7:45 PM      Result Value Range   WBC 10.9 (*) 4.0 - 10.5 K/uL   RBC 4.72  4.22 - 5.81 MIL/uL   Hemoglobin 15.7  13.0 - 17.0 g/dL   HCT 78.2  95.6 - 21.3 %   MCV 93.6  78.0 - 100.0 fL   MCH 33.3  26.0 - 34.0 pg   MCHC 35.5  30.0 - 36.0 g/dL   RDW 08.6  57.8 - 46.9 %   Platelets 198  150 - 400 K/uL   Neutrophils Relative % 61  43 - 77 %   Lymphocytes Relative 27  12 - 46 %   Monocytes Relative 9  3 - 12 %   Eosinophils Relative 3  0 - 5 %   Basophils Relative 0  0 - 1 %   Band Neutrophils 0  0 - 10 %   Metamyelocytes Relative 0     Myelocytes 0     Promyelocytes Absolute 0     Blasts 0     nRBC 0  0 /100 WBC   Neutro Abs 6.7  1.7 - 7.7 K/uL   Lymphs Abs 2.9  0.7 - 4.0 K/uL   Monocytes Absolute 1.0  0.1 - 1.0 K/uL   Eosinophils Absolute 0.3  0.0 - 0.7 K/uL   Basophils Absolute 0.0  0.0 - 0.1 K/uL   WBC Morphology ATYPICAL LYMPHOCYTES    COMPREHENSIVE METABOLIC PANEL     Status: Abnormal   Collection Time    09/17/12  7:45 PM      Result Value Range   Sodium 132 (*) 135 - 145 mEq/L   Potassium 4.1  3.5 - 5.1 mEq/L   Chloride 99  96 - 112 mEq/L   CO2 25  19 - 32 mEq/L   Glucose, Bld 109 (*) 70 - 99 mg/dL   BUN 13  6 - 23 mg/dL   Creatinine, Ser 1.61  0.50 - 1.35 mg/dL   Calcium 9.5  8.4 - 09.6 mg/dL   Total Protein 6.2  6.0 - 8.3 g/dL   Albumin 3.0 (*) 3.5 - 5.2 g/dL   AST 83 (*) 0 - 37 U/L   ALT 151 (*) 0 - 53 U/L   Alkaline Phosphatase 110  39 - 117 U/L   Total Bilirubin 1.0  0.3 - 1.2 mg/dL   GFR calc non Af Amer 70 (*) >90 mL/min   GFR calc Af Amer 81 (*) >90 mL/min   Comment:            The eGFR has been calculated     using the CKD EPI equation.     This calculation has not been     validated in all clinical     situations.     eGFR's persistently     <90 mL/min signify     possible Chronic Kidney Disease.  CULTURE, BLOOD (ROUTINE X 2)     Status: None   Collection Time      09/17/12  7:47 PM      Result Value Range   Specimen Description BLOOD RIGHT ANTECUBITAL     Special Requests BOTTLES DRAWN AEROBIC AND ANAEROBIC 10CC     Culture  Setup Time 09/18/2012 02:01     Culture       Value:        BLOOD CULTURE RECEIVED NO GROWTH TO DATE CULTURE WILL BE HELD FOR 5 DAYS BEFORE ISSUING A FINAL NEGATIVE REPORT   Report Status PENDING    URINALYSIS, ROUTINE W REFLEX MICROSCOPIC     Status: Abnormal   Collection Time    09/17/12  7:50 PM      Result Value Range   Color, Urine ORANGE (*) YELLOW   Comment: BIOCHEMICALS MAY BE AFFECTED BY COLOR   APPearance CLOUDY (*) CLEAR   Specific Gravity, Urine 1.040 (*) 1.005 - 1.030   pH 5.5  5.0 - 8.0   Glucose, UA NEGATIVE  NEGATIVE mg/dL   Hgb urine dipstick SMALL (*) NEGATIVE   Bilirubin Urine SMALL (*) NEGATIVE   Ketones, ur 15 (*) NEGATIVE mg/dL   Protein, ur 30 (*) NEGATIVE mg/dL   Urobilinogen, UA 1.0  0.0 - 1.0 mg/dL   Nitrite NEGATIVE  NEGATIVE   Leukocytes, UA NEGATIVE  NEGATIVE  URINE CULTURE     Status: None   Collection Time    09/17/12  7:50 PM      Result Value Range   Specimen Description URINE, RANDOM     Special Requests NONE     Culture  Setup Time 09/17/2012 21:06     Colony Count NO GROWTH     Culture NO GROWTH     Report Status 09/18/2012 FINAL    URINE MICROSCOPIC-ADD ON     Status: Abnormal   Collection Time    09/17/12  7:50 PM      Result Value Range   Squamous Epithelial / LPF RARE  RARE   WBC, UA 0-2  <3 WBC/hpf   RBC / HPF 0-2  <3 RBC/hpf   Bacteria, UA RARE  RARE   Crystals CA OXALATE CRYSTALS (*) NEGATIVE   Urine-Other MUCOUS PRESENT    SEDIMENTATION RATE     Status: None   Collection Time    09/17/12 10:57 PM      Result Value Range   Sed Rate 9  0 - 16 mm/hr  ACETAMINOPHEN LEVEL     Status: None   Collection Time    09/18/12 12:30 AM      Result Value Range   Acetaminophen (Tylenol), Serum <15.0  10 - 30 ug/mL   Comment:            THERAPEUTIC CONCENTRATIONS VARY      SIGNIFICANTLY. A RANGE OF 10-30     ug/mL MAY BE AN EFFECTIVE     CONCENTRATION FOR MANY PATIENTS.     HOWEVER, SOME ARE BEST TREATED     AT CONCENTRATIONS OUTSIDE THIS     RANGE.     ACETAMINOPHEN CONCENTRATIONS     >150 ug/mL AT 4 HOURS AFTER     INGESTION AND >50 ug/mL AT 12     HOURS AFTER INGESTION ARE     OFTEN ASSOCIATED WITH TOXIC     REACTIONS.  HEPATITIS PANEL, ACUTE     Status: None   Collection Time    09/18/12 12:30 AM      Result Value Range   Hepatitis B Surface Ag NEGATIVE  NEGATIVE   HCV Ab NEGATIVE  NEGATIVE   Hep A IgM NEGATIVE  NEGATIVE   Hep B C IgM NEGATIVE  NEGATIVE   Comment: (NOTE)     High levels of Hepatitis B Core IgM antibody are detectable     during the acute stage of Hepatitis B. This antibody is used     to differentiate current from past HBV infection.  PROCALCITONIN     Status: None   Collection Time    09/18/12 12:30 AM      Result Value Range   Procalcitonin 0.33     Comment:            Interpretation:     PCT (Procalcitonin) <= 0.5 ng/mL:     Systemic infection (sepsis) is not likely.     Local bacterial infection is possible.     (NOTE)             ICU PCT Algorithm               Non ICU PCT Algorithm        ----------------------------     ------------------------------             PCT < 0.25 ng/mL  PCT < 0.1 ng/mL         Stopping of antibiotics            Stopping of antibiotics           strongly encouraged.               strongly encouraged.        ----------------------------     ------------------------------           PCT level decrease by               PCT < 0.25 ng/mL           >= 80% from peak PCT           OR PCT 0.25 - 0.5 ng/mL          Stopping of antibiotics                                                 encouraged.         Stopping of antibiotics               encouraged.        ----------------------------     ------------------------------           PCT level decrease by              PCT >=  0.25 ng/mL           < 80% from peak PCT            AND PCT >= 0.5 ng/mL            Continuing antibiotics                                                  encouraged.           Continuing antibiotics                encouraged.        ----------------------------     ------------------------------         PCT level increase compared          PCT > 0.5 ng/mL             with peak PCT AND              PCT >= 0.5 ng/mL             Escalation of antibiotics                                              strongly encouraged.          Escalation of antibiotics            strongly encouraged.  BASIC METABOLIC PANEL     Status: Abnormal   Collection Time    09/18/12  5:50 AM      Result Value Range   Sodium 131 (*) 135 - 145 mEq/L   Potassium 4.5  3.5 - 5.1 mEq/L   Chloride 99  96 - 112  mEq/L   CO2 22  19 - 32 mEq/L   Glucose, Bld 105 (*) 70 - 99 mg/dL   BUN 12  6 - 23 mg/dL   Creatinine, Ser 9.52  0.50 - 1.35 mg/dL   Calcium 8.6  8.4 - 84.1 mg/dL   GFR calc non Af Amer 74 (*) >90 mL/min   GFR calc Af Amer 85 (*) >90 mL/min   Comment:            The eGFR has been calculated     using the CKD EPI equation.     This calculation has not been     validated in all clinical     situations.     eGFR's persistently     <90 mL/min signify     possible Chronic Kidney Disease.  HEPATIC FUNCTION PANEL     Status: Abnormal   Collection Time    09/18/12  5:50 AM      Result Value Range   Total Protein 5.5 (*) 6.0 - 8.3 g/dL   Albumin 2.7 (*) 3.5 - 5.2 g/dL   AST 79 (*) 0 - 37 U/L   ALT 136 (*) 0 - 53 U/L   Alkaline Phosphatase 96  39 - 117 U/L   Total Bilirubin 1.0  0.3 - 1.2 mg/dL   Bilirubin, Direct 0.2  0.0 - 0.3 mg/dL   Indirect Bilirubin 0.8  0.3 - 0.9 mg/dL  CBC WITH DIFFERENTIAL     Status: Abnormal   Collection Time    09/18/12  5:50 AM      Result Value Range   WBC 9.3  4.0 - 10.5 K/uL   RBC 4.25  4.22 - 5.81 MIL/uL   Hemoglobin 13.6  13.0 - 17.0 g/dL   HCT 32.4  40.1 - 02.7 %    MCV 94.1  78.0 - 100.0 fL   MCH 32.0  26.0 - 34.0 pg   MCHC 34.0  30.0 - 36.0 g/dL   RDW 25.3  66.4 - 40.3 %   Platelets 174  150 - 400 K/uL   Neutrophils Relative % 49  43 - 77 %   Lymphocytes Relative 37  12 - 46 %   Monocytes Relative 10  3 - 12 %   Eosinophils Relative 1  0 - 5 %   Basophils Relative 3 (*) 0 - 1 %   Neutro Abs 4.6  1.7 - 7.7 K/uL   Lymphs Abs 3.4  0.7 - 4.0 K/uL   Monocytes Absolute 0.9  0.1 - 1.0 K/uL   Eosinophils Absolute 0.1  0.0 - 0.7 K/uL   Basophils Absolute 0.3 (*) 0.0 - 0.1 K/uL   WBC Morphology ATYPICAL LYMPHOCYTES    SEDIMENTATION RATE     Status: None   Collection Time    09/18/12  4:18 PM      Result Value Range   Sed Rate 13  0 - 16 mm/hr  C-REACTIVE PROTEIN     Status: Abnormal   Collection Time    09/18/12  4:18 PM      Result Value Range   CRP 7.0 (*) <0.60 mg/dL  RHEUMATOID FACTOR     Status: None   Collection Time    09/18/12  4:18 PM      Result Value Range   Rheumatoid Factor <10  <=14 IU/mL   Comment: (NOTE)  Interpretive Table                        Low Positive: 15 - 41 IU/mL                        High Positive:  >= 42 IU/mL     In addition to the RF result, and clinical symptoms including joint     involvement, the 2010 ACR Classification Criteria for     scoring/diagnosing Rheumatoid Arthritis include the results of the     following tests:  CRP (47829), ESR (15010), and CCP (AP[facility]) (56213).     www.rheumatology.org/practice/clinical/classification/ra/ra_2010.asp  HIV ANTIBODY (ROUTINE TESTING)     Status: None   Collection Time    09/18/12  4:18 PM      Result Value Range   HIV NON REACTIVE  NON REACTIVE  LACTATE DEHYDROGENASE     Status: Abnormal   Collection Time    09/18/12  4:18 PM      Result Value Range   LDH 468 (*) 94 - 250 U/L  CK TOTAL AND CKMB     Status: None   Collection Time    09/18/12  4:19 PM      Result Value Range   Total CK 91  7 - 232 U/L   CK, MB 1.9  0.3 - 4.0  ng/mL   Relative Index RELATIVE INDEX IS INVALID  0.0 - 2.5   Comment: WHEN CK < 100 U/L             MRSA PCR SCREENING     Status: None   Collection Time    09/18/12  6:01 PM      Result Value Range   MRSA by PCR NEGATIVE  NEGATIVE   Comment:            The GeneXpert MRSA Assay (FDA     approved for NASAL specimens     only), is one component of a     comprehensive MRSA colonization     surveillance program. It is not     intended to diagnose MRSA     infection nor to guide or     monitor treatment for     MRSA infections.  PROCALCITONIN     Status: None   Collection Time    09/19/12  5:00 AM      Result Value Range   Procalcitonin 1.02     Comment:            Interpretation:     PCT > 0.5 ng/mL and <= 2 ng/mL:     Systemic infection (sepsis) is possible,     but other conditions are known to elevate     PCT as well.     (NOTE)             ICU PCT Algorithm               Non ICU PCT Algorithm        ----------------------------     ------------------------------             PCT < 0.25 ng/mL                 PCT < 0.1 ng/mL         Stopping of antibiotics            Stopping of antibiotics  strongly encouraged.               strongly encouraged.        ----------------------------     ------------------------------           PCT level decrease by               PCT < 0.25 ng/mL           >= 80% from peak PCT           OR PCT 0.25 - 0.5 ng/mL          Stopping of antibiotics                                                 encouraged.         Stopping of antibiotics               encouraged.        ----------------------------     ------------------------------           PCT level decrease by              PCT >= 0.25 ng/mL           < 80% from peak PCT            AND PCT >= 0.5 ng/mL            Continuing antibiotics                                                  encouraged.           Continuing antibiotics                encouraged.         ----------------------------     ------------------------------         PCT level increase compared          PCT > 0.5 ng/mL             with peak PCT AND              PCT >= 0.5 ng/mL             Escalation of antibiotics                                              strongly encouraged.          Escalation of antibiotics            strongly encouraged.  COMPREHENSIVE METABOLIC PANEL     Status: Abnormal   Collection Time    09/19/12 10:40 AM      Result Value Range   Sodium 128 (*) 135 - 145 mEq/L   Potassium 4.0  3.5 - 5.1 mEq/L   Chloride 97  96 - 112 mEq/L   CO2 21  19 - 32 mEq/L   Glucose, Bld 151 (*) 70 - 99 mg/dL   BUN 10  6 - 23 mg/dL   Creatinine, Ser 1.61  0.50 - 1.35 mg/dL  Calcium 8.5  8.4 - 10.5 mg/dL   Total Protein 6.0  6.0 - 8.3 g/dL   Albumin 2.7 (*) 3.5 - 5.2 g/dL   AST 75 (*) 0 - 37 U/L   ALT 149 (*) 0 - 53 U/L   Alkaline Phosphatase 96  39 - 117 U/L   Total Bilirubin 1.1  0.3 - 1.2 mg/dL   GFR calc non Af Amer 90 (*) >90 mL/min   GFR calc Af Amer >90  >90 mL/min   Comment:            The eGFR has been calculated     using the CKD EPI equation.     This calculation has not been     validated in all clinical     situations.     eGFR's persistently     <90 mL/min signify     possible Chronic Kidney Disease.  MAGNESIUM     Status: None   Collection Time    09/19/12 10:40 AM      Result Value Range   Magnesium 2.0  1.5 - 2.5 mg/dL  SEDIMENTATION RATE     Status: None   Collection Time    09/19/12 10:40 AM      Result Value Range   Sed Rate 14  0 - 16 mm/hr  LACTATE DEHYDROGENASE     Status: Abnormal   Collection Time    09/19/12 10:40 AM      Result Value Range   LDH 466 (*) 94 - 250 U/L  CK TOTAL AND CKMB     Status: Abnormal   Collection Time    09/19/12 10:51 AM      Result Value Range   Total CK 111  7 - 232 U/L   CK, MB 3.2  0.3 - 4.0 ng/mL   Relative Index 2.9 (*) 0.0 - 2.5  TROPONIN I     Status: None   Collection Time    09/19/12  10:51 AM      Result Value Range   Troponin I <0.30  <0.30 ng/mL   Comment:            Due to the release kinetics of cTnI,     a negative result within the first hours     of the onset of symptoms does not rule out     myocardial infarction with certainty.     If myocardial infarction is still suspected,     repeat the test at appropriate intervals.    MICRO: 7/17 blood cx  X 2 NGTd 7/17 urine cx NGTD 7/19 blood cx x 1 PENDING IMAGING: Ct Abdomen Pelvis Wo Contrast  09/18/2012   *RADIOLOGY REPORT*  Clinical Data: Lower abdominal pain and fever.  CT ABDOMEN AND PELVIS WITHOUT CONTRAST  Technique:  Multidetector CT imaging of the abdomen and pelvis was performed following the standard protocol without intravenous contrast.  Comparison: MRI of the lumbar spine and right hip performed 03/17/2010  Findings: The visualized lung bases are clear.  The liver and spleen are unremarkable in appearance.  The gallbladder is within normal limits.  The pancreas and adrenal glands are unremarkable.  Nonspecific perinephric stranding is noted bilaterally.  Small bilateral parapelvic renal cysts are seen.  Calcifications at the right renal hilum appear to be vascular in nature.  No free fluid is identified.  The small bowel is unremarkable in appearance.  The stomach is within normal limits.  No acute vascular abnormalities are seen.  Scattered calcification is noted along the abdominal aorta and its branches.  A small focus of fat adjacent to the third segment of the duodenum may reflect a small lipoma or a partially calcified epiploic appendage.  The appendix is normal in caliber and contains contrast, without evidence for appendicitis.  Contrast progresses to the level of the distal descending colon.  The colon is unremarkable in appearance.  The bladder is mildly distended and grossly unremarkable in appearance.  A small left inguinal hernia is seen, containing only fat.  The prostate remains normal in size.   No inguinal lymphadenopathy is seen.  No acute osseous abnormalities are identified.  Vacuum phenomenon is noted at L4-L5 and L5-S1.  IMPRESSION:  1.  No acute abnormality seen within the abdomen or pelvis. 2.  Nonspecific perinephric stranding noted bilaterally; small bilateral parapelvic renal cysts seen. 3.  Scattered calcification along the abdominal aorta and its branches. 4.  Small left inguinal hernia, containing only fat.   Original Report Authenticated By: Tonia Ghent, M.D.   Dg Chest 2 View  09/17/2012   *RADIOLOGY REPORT*  Clinical Data: Fever for 8 days.  History of smoking.  CHEST - 2 VIEW  Comparison: Chest radiograph performed 09/12/2012.  Findings: The lungs are well-aerated and clear.  There is no evidence of focal opacification, pleural effusion or pneumothorax.  The heart is normal in size; the patient is status post median sternotomy, with evidence of prior CABG.  No acute osseous abnormalities are seen.  IMPRESSION: No acute cardiopulmonary process seen.   Original Report Authenticated By: Tonia Ghent, M.D.   Ct Head Wo Contrast  09/18/2012   *RADIOLOGY REPORT*  Clinical Data: 62 year old male with fever of unknown origin.  CT HEAD WITHOUT CONTRAST  Technique:  Contiguous axial images were obtained from the base of the skull through the vertex without contrast.  Comparison: Brain and cervical spine MRI 10/14/2018 09/2005.  Findings: Paranasal sinus mucosal thickening has decreased, residual primarily in the left ethmoid air cells.  Mastoids are clear. No acute osseous abnormality identified.  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  Visualized orbits and scalp soft tissues are within normal limits.  Calcified atherosclerosis at the skull base.  Normal cerebral volume.  No midline shift, ventriculomegaly, mass effect, evidence of mass lesion, intracranial hemorrhage or evidence of cortically based acute infarction.  Gray-white matter differentiation is within normal  limits throughout the brain.  No suspicious intracranial vascular hyperdensity.  IMPRESSION: 1. Normal noncontrast CT appearance of the brain. 2.  Mild paranasal sinus inflammatory changes, decreased since 2007.   Original Report Authenticated By: Erskine Speed, M.D.   Ct Chest Wo Contrast  09/18/2012   *RADIOLOGY REPORT*  Clinical Data: Fever, elevated liver function tests  CT CHEST WITHOUT CONTRAST  Technique:  Multidetector CT imaging of the chest was performed following the standard protocol without IV contrast.  Comparison: CT abdomen/pelvis same date, chest CT 06/14/2012  Findings: Evidence of CABG noted.  Moderate atheromatous aortic calcification noted without aneurysm.  Heart size is normal.  No pericardial or pleural effusion.  Incomplete imaging of the upper abdomen redemonstrates hepatic hypodensity compatible with steatosis.  Curvilinear left lower lobe scarring is re-identified.  Trace pleural fluid or thickening is noted.  The lungs are otherwise clear.  Central airways are patent.  Subacute left posterior seventh and eighth rib fractures are noted.  IMPRESSION: No acute cardiopulmonary process.  Hepatic steatosis is redemonstrated, which may account for the history of elevated liver function  tests.   Original Report Authenticated By: Christiana Pellant, M.D.    Assessment/Plan:  62yo M with RA and PMR on MTX 9mg  weekly and pred 5mg  daily presents fever of unknown origin starting roughly 10 days ago of sudden onset, intermittently last up to 4hrs at a time, getting as high as 102-103F. He is also found to have atypical lymphocyte of differential, mild thrombocytopenia, transaminitis, and hyponatremia. He has outdoor exposure but no recollection of tickbite.  - concern for tickbite illness causing this presentation. Recommend to treat with doxycycline 100mg  IV Q12hr. Follow up with cbc with diff and cmp daily for the next 2 days to see if any improvement. Await follow up on lyme, RMSF,  ehrlichiosis - this could also be due to non-infectious causes such as auto-immune process vs. Malignancy. He has had full body CT scan of head/chest/abd which is reassuring for not having lymphadenopathy. Agree with checking serum protein electropheresis.  Await ana /RF is negative. - does not seem consistent with active TB, will discontinue airborne precautions, can get quantiferon to see if he has had hx of latent TB exposure. - for fevers, would recommend to try ibuprofen sparingly to help with symptoms, decrease his tachycardia assoc with febrile events.  Duke Salvia Drue Second MD MPH [facility] [phone]

## 2012-09-19 NOTE — Progress Notes (Signed)
Pt converted from NSR/ST to Aflutter, rate controlled in the 80s-90s.  Pt asymptomatic.  MD notified.  Instructed to notify physician with HR>110.  Will continue to monitor.  William [name]

## 2012-09-19 NOTE — Progress Notes (Signed)
[facility] PROGRESS NOTE  William [name] NFA:[id number] DOB: May 29, 1950 DOA: 09/17/2012 PCP: Vivien Presto, MD  Assessment/Plan: 1. FUO: Patient continues to have chills/rigors proximally every 6 hours. Patient is immunocompromised secondary to being on chronic steroids for PMR area will begin a FU0 workup. Obtain ESR, CRP LDH, TB Quantriferon test, Serum protein electrophoresis, creatine kinase, RF, ANA, we'll also obtain CT scan of chest and head.. Will consider LP. Transfer to ward 2900 so we can use Cooling Blanket. Hold ALL ABx    2. PMR; Continue Home meds  3.  Rash; Pt unsure when arose. 2dary to PMR vs Drug RXn vs Infx  4. HTN: Cont. Home meds     5. Tickborne illness; spoke with Dr. Judyann Munson (infectious disease) [phone] pgr reviewed findings and health care plan to date. She concurs with plan now that we have drawn all cultures requested we restart doxycycline. We'll restart doxycycline 100 mg twice a day.  6. elevated liver enzymes; unsure if caused by Tylenol doses or unknown illness hold all Tylenol and use cooling blanket for fever   Code Status: Full Family Communication: Wife and Daughter present for care Plan Disposition Plan: ?     HPI/Subjective: HPI: William [name] is a 62 y.o. WM PMHx polymyalgia rheumatica, CAD status post CABG, hyperlipidemia and hypertension has been experiencing fever and chills over the last one week. Patient had come to the ER 2 days after his symptoms had started and was placed on doxycycline. Patient on doxycycline for 2 days after which his fever subsided and he had gone to [place]. Over there he started spiking fever again and he had gone to the ER and was placed on Bactrim empirically. As per the family workup done over there was negative for any source. Despite taking Bactrim for last 2-3 days patient still has fever and had come to the ER today. Patient states that she has had a skin biopsy and excision done prior  to this fever episode. Results of the biopsy are pending. Prior to the episode also patient has been having some joint aches and his rheumatology was planning degrees his prednisone from 5 mg to 10 which was not done because patient started developing fever and his rheumatologist wanted to make sure there was no infection. Patient otherwise denies any chest pain shortness of breath nausea vomiting diarrhea. Has been having chronic nonproductive cough. Over the last few days patient also has been having lower abdominal pain. Denies any dysuria or discharges. The first 2 days of fever patient had any and retro-orbital pain which at this time is completely resolved and does not have any neck rigidity of pain. In the ER patient's LFTs are found to be mildly elevated. Patient denies to have taking any new medication. Denies any insect bites. However patient does camp in [place] on further questioning stay had 4 days of doxycycline, 1 day of Bactrim, one day of Zosyn/vancomycin (administered on 09/17/2012).    Ct Abd and Pelvis 09/18/2013 1. No acute abnormality seen within the abdomen or pelvis.  2. Nonspecific perinephric stranding noted bilaterally; small  bilateral parapelvic renal cysts seen.  3. Scattered calcification along the abdominal aorta and its  branches.  4. Small left inguinal hernia, containing only fat.   CT chest without contrast 09/18/2012  No acute cardiopulmonary process.  Hepatic steatosis is redemonstrated, which may account for the  history of elevated liver function tests.  CT head without contrast 09/18/2012 1. Normal noncontrast CT appearance of  the brain.  2. Mild paranasal sinus inflammatory changes, decreased since  2007.       Objective: Filed Vitals:   09/19/12 0600 09/19/12 0700 09/19/12 0831 09/19/12 1100  BP:   143/71   Pulse:   89   Temp:   99.6 F (37.6 C) 100.5 F (38.1 C)  TempSrc:   Oral Oral  Resp: 18 26 18    Height:      Weight:      SpO2:   98%      Intake/Output Summary (Last 24 hours) at 09/19/12 1141 Last data filed at 09/19/12 1100  Gross per 24 hour  Intake 3453.33 ml  Output   2985 ml  Net 468.33 ml   Filed Weights   09/17/12 1823 09/18/12 0025  Weight: 109.77 kg (242 lb) 111.585 kg (246 lb)   FHx; mother; lung cancer/ gastric cancer/Pnacreas Ca          FATHER; BLADDER cA    Exam:   General:  Alert, No Rigors  Cardiovascular: RR Tachycardic, (-) M/R/G, DP/PT pulse 2+ bilat  Respiratory: CTA Bilat  Abdomen: Soft, NT, ND, (+) BS  Musculoskeletal: (-) Pedal Edema Bilat, biopsy site on right upper back margins are clean negative sign of infection  Data Reviewed: Basic Metabolic Panel:  Recent Labs Lab 09/12/12 1450 09/17/12 1945 09/18/12 0550 09/19/12 1040  NA 131* 132* 131* 128*  K 4.1 4.1 4.5 4.0  CL 101 99 99 97  CO2 22 25 22 21   GLUCOSE 124* 109* 105* 151*  BUN 18 13 12 10   CREATININE 0.88 1.10 1.05 0.88  CALCIUM 9.2 9.5 8.6 8.5  MG  --   --   --  2.0   Liver Function Tests:  Recent Labs Lab 09/17/12 1945 09/18/12 0550 09/19/12 1040  AST 83* 79* 75*  ALT 151* 136* 149*  ALKPHOS 110 96 96  BILITOT 1.0 1.0 1.1  PROT 6.2 5.5* 6.0  ALBUMIN 3.0* 2.7* 2.7*   No results found for this basename: LIPASE, AMYLASE,  in the last 168 hours No results found for this basename: AMMONIA,  in the last 168 hours CBC:  Recent Labs Lab 09/12/12 1450 09/17/12 1945 09/18/12 0550  WBC 5.0 10.9* 9.3  NEUTROABS 3.4 6.7 4.6  HGB 15.7 15.7 13.6  HCT 45.2 44.2 40.0  MCV 93.8 93.6 94.1  PLT 120* 198 174   Cardiac Enzymes:  Recent Labs Lab 09/18/12 1619  CKTOTAL 91  CKMB 1.9   BNP (last 3 results) No results found for this basename: PROBNP,  in the last 8760 hours CBG: No results found for this basename: GLUCAP,  in the last 168 hours  Recent Results (from the past 240 hour(s))  CULTURE, BLOOD (ROUTINE X 2)     Status: None   Collection Time    09/17/12  7:40 PM      Result Value Range  Status   Specimen Description BLOOD LEFT ANTECUBITAL   Final   Special Requests BOTTLES DRAWN AEROBIC AND ANAEROBIC 10CC   Final   Culture  Setup Time 09/18/2012 02:01   Final   Culture     Final   Value:        BLOOD CULTURE RECEIVED NO GROWTH TO DATE CULTURE WILL BE HELD FOR 5 DAYS BEFORE ISSUING A FINAL NEGATIVE REPORT   Report Status PENDING   Incomplete  CULTURE, BLOOD (ROUTINE X 2)     Status: None   Collection Time    09/17/12  7:47  PM      Result Value Range Status   Specimen Description BLOOD RIGHT ANTECUBITAL   Final   Special Requests BOTTLES DRAWN AEROBIC AND ANAEROBIC 10CC   Final   Culture  Setup Time 09/18/2012 02:01   Final   Culture     Final   Value:        BLOOD CULTURE RECEIVED NO GROWTH TO DATE CULTURE WILL BE HELD FOR 5 DAYS BEFORE ISSUING A FINAL NEGATIVE REPORT   Report Status PENDING   Incomplete  URINE CULTURE     Status: None   Collection Time    09/17/12  7:50 PM      Result Value Range Status   Specimen Description URINE, RANDOM   Final   Special Requests NONE   Final   Culture  Setup Time 09/17/2012 21:06   Final   Colony Count NO GROWTH   Final   Culture NO GROWTH   Final   Report Status 09/18/2012 FINAL   Final  MRSA PCR SCREENING     Status: None   Collection Time    09/18/12  6:01 PM      Result Value Range Status   MRSA by PCR NEGATIVE  NEGATIVE Final   Comment:            The GeneXpert MRSA Assay (FDA     approved for NASAL specimens     only), is one component of a     comprehensive MRSA colonization     surveillance program. It is not     intended to diagnose MRSA     infection nor to guide or     monitor treatment for     MRSA infections.     Studies: Ct Abdomen Pelvis Wo Contrast  09/18/2012   *RADIOLOGY REPORT*  Clinical Data: Lower abdominal pain and fever.  CT ABDOMEN AND PELVIS WITHOUT CONTRAST  Technique:  Multidetector CT imaging of the abdomen and pelvis was performed following the standard protocol without intravenous  contrast.  Comparison: MRI of the lumbar spine and right hip performed 03/17/2010  Findings: The visualized lung bases are clear.  The liver and spleen are unremarkable in appearance.  The gallbladder is within normal limits.  The pancreas and adrenal glands are unremarkable.  Nonspecific perinephric stranding is noted bilaterally.  Small bilateral parapelvic renal cysts are seen.  Calcifications at the right renal hilum appear to be vascular in nature.  No free fluid is identified.  The small bowel is unremarkable in appearance.  The stomach is within normal limits.  No acute vascular abnormalities are seen.  Scattered calcification is noted along the abdominal aorta and its branches.  A small focus of fat adjacent to the third segment of the duodenum may reflect a small lipoma or a partially calcified epiploic appendage.  The appendix is normal in caliber and contains contrast, without evidence for appendicitis.  Contrast progresses to the level of the distal descending colon.  The colon is unremarkable in appearance.  The bladder is mildly distended and grossly unremarkable in appearance.  A small left inguinal hernia is seen, containing only fat.  The prostate remains normal in size.  No inguinal lymphadenopathy is seen.  No acute osseous abnormalities are identified.  Vacuum phenomenon is noted at L4-L5 and L5-S1.  IMPRESSION:  1.  No acute abnormality seen within the abdomen or pelvis. 2.  Nonspecific perinephric stranding noted bilaterally; small bilateral parapelvic renal cysts seen. 3.  Scattered calcification along the  abdominal aorta and its branches. 4.  Small left inguinal hernia, containing only fat.   Original Report Authenticated By: Tonia Ghent, M.D.   Dg Chest 2 View  09/17/2012   *RADIOLOGY REPORT*  Clinical Data: Fever for 8 days.  History of smoking.  CHEST - 2 VIEW  Comparison: Chest radiograph performed 09/12/2012.  Findings: The lungs are well-aerated and clear.  There is no evidence of  focal opacification, pleural effusion or pneumothorax.  The heart is normal in size; the patient is status post median sternotomy, with evidence of prior CABG.  No acute osseous abnormalities are seen.  IMPRESSION: No acute cardiopulmonary process seen.   Original Report Authenticated By: Tonia Ghent, M.D.   Ct Head Wo Contrast  09/18/2012   *RADIOLOGY REPORT*  Clinical Data: 62 year old male with fever of unknown origin.  CT HEAD WITHOUT CONTRAST  Technique:  Contiguous axial images were obtained from the base of the skull through the vertex without contrast.  Comparison: Brain and cervical spine MRI 10/14/2018 09/2005.  Findings: Paranasal sinus mucosal thickening has decreased, residual primarily in the left ethmoid air cells.  Mastoids are clear. No acute osseous abnormality identified.  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  Visualized orbits and scalp soft tissues are within normal limits.  Calcified atherosclerosis at the skull base.  Normal cerebral volume.  No midline shift, ventriculomegaly, mass effect, evidence of mass lesion, intracranial hemorrhage or evidence of cortically based acute infarction.  Gray-white matter differentiation is within normal limits throughout the brain.  No suspicious intracranial vascular hyperdensity.  IMPRESSION: 1. Normal noncontrast CT appearance of the brain. 2.  Mild paranasal sinus inflammatory changes, decreased since 2007.   Original Report Authenticated By: Erskine Speed, M.D.   Ct Chest Wo Contrast  09/18/2012   *RADIOLOGY REPORT*  Clinical Data: Fever, elevated liver function tests  CT CHEST WITHOUT CONTRAST  Technique:  Multidetector CT imaging of the chest was performed following the standard protocol without IV contrast.  Comparison: CT abdomen/pelvis same date, chest CT 06/14/2012  Findings: Evidence of CABG noted.  Moderate atheromatous aortic calcification noted without aneurysm.  Heart size is normal.  No pericardial or pleural  effusion.  Incomplete imaging of the upper abdomen redemonstrates hepatic hypodensity compatible with steatosis.  Curvilinear left lower lobe scarring is re-identified.  Trace pleural fluid or thickening is noted.  The lungs are otherwise clear.  Central airways are patent.  Subacute left posterior seventh and eighth rib fractures are noted.  IMPRESSION: No acute cardiopulmonary process.  Hepatic steatosis is redemonstrated, which may account for the history of elevated liver function tests.   Original Report Authenticated By: Christiana Pellant, M.D.    Scheduled Meds: . amLODipine  5 mg Oral QPM  . aspirin  81 mg Oral QPM  . bacitracin   Topical Daily  . folic acid  1 mg Oral Daily  . labetalol  100 mg Oral BID  . lisinopril  40 mg Oral Daily  . omega-3 acid ethyl esters  1 g Oral BID  . predniSONE  5 mg Oral Q breakfast  . sodium chloride  3 mL Intravenous Q12H   Continuous Infusions:    Principal Problem:   Fever Active Problems:   Polymyalgia rheumatica   CAD (coronary artery disease)   Hyperlipidemia   HTN (hypertension)   Atrial flutter    Time spent: 90 min    Shervon Kerwin, J  [facility] Pager [phone]. If 7PM-7AM, please contact night-coverage at [email or web address], password  [email or web address] 09/19/2012, 11:41 AM  LOS: 2 days

## 2012-09-20 ENCOUNTER — Other Ambulatory Visit: Payer: Self-pay

## 2012-09-20 DIAGNOSIS — I209 Angina pectoris, unspecified: Secondary | ICD-10-CM

## 2012-09-20 LAB — CBC WITH DIFFERENTIAL/PLATELET
Basophils Absolute: 0.1 10*3/uL (ref 0.0–0.1)
Lymphocytes Relative: 33 % (ref 12–46)
Lymphs Abs: 2.3 10*3/uL (ref 0.7–4.0)
Monocytes Relative: 11 % (ref 3–12)
Neutrophils Relative %: 54 % (ref 43–77)
Platelets: 204 10*3/uL (ref 150–400)
RDW: 13.7 % (ref 11.5–15.5)
WBC: 6.9 10*3/uL (ref 4.0–10.5)

## 2012-09-20 LAB — COMPREHENSIVE METABOLIC PANEL
ALT: 142 U/L — ABNORMAL HIGH (ref 0–53)
AST: 72 U/L — ABNORMAL HIGH (ref 0–37)
CO2: 18 mEq/L — ABNORMAL LOW (ref 19–32)
Chloride: 95 mEq/L — ABNORMAL LOW (ref 96–112)
GFR calc Af Amer: >90 mL/min (ref >90)
GFR calc non Af Amer: >90 mL/min (ref >90)
Glucose, Bld: 103 mg/dL — ABNORMAL HIGH (ref 70–99)
Sodium: 128 mEq/L — ABNORMAL LOW (ref 135–145)
Total Bilirubin: 1.3 mg/dL — ABNORMAL HIGH (ref 0.3–1.2)

## 2012-09-20 LAB — TROPONIN I: Troponin I: <0.3 ng/mL (ref <0.30)

## 2012-09-20 MED ORDER — SODIUM CHLORIDE 0.9 % IV BOLUS (SEPSIS)
500.0000 mL | Freq: Once | INTRAVENOUS | Status: AC
  Administered 2012-09-20: 500 mL via INTRAVENOUS

## 2012-09-20 MED ORDER — DEXTROSE 5 % IV SOLN
2.0000 g | INTRAVENOUS | Status: AC
  Administered 2012-09-20: 2 g via INTRAVENOUS
  Filled 2012-09-20: qty 2

## 2012-09-20 MED ORDER — IBUPROFEN 400 MG PO TABS
400.0000 mg | ORAL_TABLET | Freq: Four times a day (QID) | ORAL | Status: DC | PRN
  Administered 2012-09-21 – 2012-09-27 (×14): 400 mg via ORAL
  Filled 2012-09-20 (×15): qty 1

## 2012-09-20 MED ORDER — DEXTROSE 5 % IV SOLN
2.0000 g | Freq: Three times a day (TID) | INTRAVENOUS | Status: DC
  Administered 2012-09-21: 2 g via INTRAVENOUS
  Filled 2012-09-20 (×3): qty 2

## 2012-09-20 MED ORDER — IBUPROFEN 600 MG PO TABS
600.0000 mg | ORAL_TABLET | Freq: Once | ORAL | Status: AC
  Administered 2012-09-20: 600 mg via ORAL
  Filled 2012-09-20: qty 1

## 2012-09-20 MED ORDER — SODIUM CHLORIDE 0.9 % IV SOLN
INTRAVENOUS | Status: DC
  Administered 2012-09-20 – 2012-09-23 (×2): via INTRAVENOUS
  Administered 2012-09-23: 75 mL via INTRAVENOUS
  Administered 2012-09-24: 23:00:00 via INTRAVENOUS

## 2012-09-20 NOTE — Progress Notes (Signed)
Pt complains of some chest pressure midsternal 5/10 with increasing fever and chills; O2 2L [place] applied; 12 lead EKG completed and read by ID doctor at bedside; pain subsides to 0/10 after oxygen application; will continue to closely monitor

## 2012-09-20 NOTE — Progress Notes (Signed)
[facility] PROGRESS NOTE  MALVERN KADLEC WUJ:[id number] DOB: Dec 23, 1950 DOA: 09/17/2012 PCP: Vivien Presto, MD  Assessment/Plan: 1. FUO: Patient continues to have chills/rigors proximally every 6 hours. Patient is immunocompromised secondary to being on chronic steroids for PMR area will begin a FU0 workup. Obtain ESR, CRP LDH, TB Quantriferon test, Serum protein electrophoresis, creatine kinase, RF, ANA, we'll also obtain CT scan of chest and head.. Will consider LP. Transfer to ward 2900 so we can use Cooling Blanket. Hold ALL ABx    2. PMR; Continue Home meds  3.  Rash; resolved   4. HTN: Under control on current medication although patient does continue to have some episodes of tachycardia      5. Tickborne illness; spoke with Dr. Judyann Munson (infectious disease) [phone] pgr reviewed findings and health care plan to date. She concurs with plan now that we have drawn all cultures requested we restart doxycycline. Noted overnight that Dr. Drue Second changed doxycycline from by mouth to IV 100 mg twice a day. We'll continue to await labs which were drawn yesterday  6. elevated liver enzymes; continue to be elevated even with cessation of Tylenol. We'll continue to monitor. Patient to continue to usecooling blanket for fever   Code Status: Full Family Communication: Wife and Daughter present for care Plan Disposition Plan: ?     HPI/Subjective: HPI: William [name] is a 62 y.o. WM PMHx polymyalgia rheumatica, CAD status post CABG, hyperlipidemia and hypertension has been experiencing fever and chills over the last one week. Patient had come to the ER 2 days after his symptoms had started and was placed on doxycycline. Patient on doxycycline for 2 days after which his fever subsided and he had gone to [place]. Over there he started spiking fever again and he had gone to the ER and was placed on Bactrim empirically. As per the family workup done over there was negative for  any source. Despite taking Bactrim for last 2-3 days patient still has fever and had come to the ER today. Patient states that she has had a skin biopsy and excision done prior to this fever episode. Results of the biopsy are pending. Prior to the episode also patient has been having some joint aches and his rheumatology was planning degrees his prednisone from 5 mg to 10 which was not done because patient started developing fever and his rheumatologist wanted to make sure there was no infection. Patient otherwise denies any chest pain shortness of breath nausea vomiting diarrhea. Has been having chronic nonproductive cough. Over the last few days patient also has been having lower abdominal pain. Denies any dysuria or discharges. The first 2 days of fever patient had any and retro-orbital pain which at this time is completely resolved and does not have any neck rigidity of pain. In the ER patient's LFTs are found to be mildly elevated. Patient denies to have taking any new medication. Denies any insect bites. However patient does [place] in [place] on further questioning stay had 4 days of doxycycline, 1 day of Bactrim, one day of Zosyn/vancomycin (administered on 09/17/2012). OVERNIGHT patient had episode of fever(38.9) associated with Rigors and chest pain cardiac enzymes obtained and were negative     Ct Abd and Pelvis 09/18/2013 1. No acute abnormality seen within the abdomen or pelvis.  2. Nonspecific perinephric stranding noted bilaterally; small  bilateral parapelvic renal cysts seen.  3. Scattered calcification along the abdominal aorta and its  branches.  4. Small left inguinal hernia, containing  only fat.   CT chest without contrast 09/18/2012  No acute cardiopulmonary process.  Hepatic steatosis is redemonstrated, which may account for the  history of elevated liver function tests.  CT head without contrast 09/18/2012 1. Normal noncontrast CT appearance of the brain.  2. Mild paranasal sinus  inflammatory changes, decreased since  2007.       Objective: Filed Vitals:   09/20/12 0400 09/20/12 0500 09/20/12 0614 09/20/12 0818  BP: 149/70 141/121 84/61 89/66  Pulse:    73  Temp: 102 F (38.9 C)   97.5 F (36.4 C)  TempSrc: Oral   Oral  Resp: 20 17 22 16   Height:      Weight:      SpO2:    95%    Intake/Output Summary (Last 24 hours) at 09/20/12 1046 Last data filed at 09/20/12 0700  Gross per 24 hour  Intake   2040 ml  Output   1750 ml  Net    290 ml   Filed Weights   09/17/12 1823 09/18/12 0025  Weight: 109.77 kg (242 lb) 111.585 kg (246 lb)   FHx; mother; lung cancer/ gastric cancer/Pnacreas Ca          FATHER; BLADDER cA    Exam:   General:  Alert, No Rigors  Cardiovascular: RR Tachycardic, (-) M/R/G, DP/PT pulse 2+ bilat  Respiratory: CTA Bilat  Abdomen: Soft, NT, ND, (+) BS       Musculoskeletal: (-) Pedal Edema Bilat,    Data Reviewed: Basic Metabolic Panel:  Recent Labs Lab 09/17/12 1945 09/18/12 0550 09/19/12 1040  NA 132* 131* 128*  K 4.1 4.5 4.0  CL 99 99 97  CO2 25 22 21   GLUCOSE 109* 105* 151*  BUN 13 12 10   CREATININE 1.10 1.05 0.88  CALCIUM 9.5 8.6 8.5  MG  --   --  2.0   Liver Function Tests:  Recent Labs Lab 09/17/12 1945 09/18/12 0550 09/19/12 1040  AST 83* 79* 75*  ALT 151* 136* 149*  ALKPHOS 110 96 96  BILITOT 1.0 1.0 1.1  PROT 6.2 5.5* 6.0  ALBUMIN 3.0* 2.7* 2.7*   No results found for this basename: LIPASE, AMYLASE,  in the last 168 hours No results found for this basename: AMMONIA,  in the last 168 hours CBC:  Recent Labs Lab 09/17/12 1945 09/18/12 0550  WBC 10.9* 9.3  NEUTROABS 6.7 4.6  HGB 15.7 13.6  HCT 44.2 40.0  MCV 93.6 94.1  PLT 198 174   Cardiac Enzymes:  Recent Labs Lab 09/18/12 1619 09/19/12 1051 09/19/12 1556 09/19/12 2225 09/20/12 0340  CKTOTAL 91 111  --   --   --   CKMB 1.9 3.2  --   --   --   TROPONINI  --  <0.30 <0.30 <0.30 <0.30   BNP (last 3 results) No  results found for this basename: PROBNP,  in the last 8760 hours CBG: No results found for this basename: GLUCAP,  in the last 168 hours  Recent Results (from the past 240 hour(s))  CULTURE, BLOOD (ROUTINE X 2)     Status: None   Collection Time    09/17/12  7:40 PM      Result Value Range Status   Specimen Description BLOOD LEFT ANTECUBITAL   Final   Special Requests BOTTLES DRAWN AEROBIC AND ANAEROBIC 10CC   Final   Culture  Setup Time 09/18/2012 02:01   Final   Culture     Final  Value:        BLOOD CULTURE RECEIVED NO GROWTH TO DATE CULTURE WILL BE HELD FOR 5 DAYS BEFORE ISSUING A FINAL NEGATIVE REPORT   Report Status PENDING   Incomplete  CULTURE, BLOOD (ROUTINE X 2)     Status: None   Collection Time    09/17/12  7:47 PM      Result Value Range Status   Specimen Description BLOOD RIGHT ANTECUBITAL   Final   Special Requests BOTTLES DRAWN AEROBIC AND ANAEROBIC 10CC   Final   Culture  Setup Time 09/18/2012 02:01   Final   Culture     Final   Value:        BLOOD CULTURE RECEIVED NO GROWTH TO DATE CULTURE WILL BE HELD FOR 5 DAYS BEFORE ISSUING A FINAL NEGATIVE REPORT   Report Status PENDING   Incomplete  URINE CULTURE     Status: None   Collection Time    09/17/12  7:50 PM      Result Value Range Status   Specimen Description URINE, RANDOM   Final   Special Requests NONE   Final   Culture  Setup Time 09/17/2012 21:06   Final   Colony Count NO GROWTH   Final   Culture NO GROWTH   Final   Report Status 09/18/2012 FINAL   Final  MRSA PCR SCREENING     Status: None   Collection Time    09/18/12  6:01 PM      Result Value Range Status   MRSA by PCR NEGATIVE  NEGATIVE Final   Comment:            The GeneXpert MRSA Assay (FDA     approved for NASAL specimens     only), is one component of a     comprehensive MRSA colonization     surveillance program. It is not     intended to diagnose MRSA     infection nor to guide or     monitor treatment for     MRSA infections.      Studies: Ct Head Wo Contrast  09/18/2012   *RADIOLOGY REPORT*  Clinical Data: 62 year old male with fever of unknown origin.  CT HEAD WITHOUT CONTRAST  Technique:  Contiguous axial images were obtained from the base of the skull through the vertex without contrast.  Comparison: Brain and cervical spine MRI 10/14/2018 09/2005.  Findings: Paranasal sinus mucosal thickening has decreased, residual primarily in the left ethmoid air cells.  Mastoids are clear. No acute osseous abnormality identified.  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  Visualized orbits and scalp soft tissues are within normal limits.  Calcified atherosclerosis at the skull base.  Normal cerebral volume.  No midline shift, ventriculomegaly, mass effect, evidence of mass lesion, intracranial hemorrhage or evidence of cortically based acute infarction.  Gray-white matter differentiation is within normal limits throughout the brain.  No suspicious intracranial vascular hyperdensity.  IMPRESSION: 1. Normal noncontrast CT appearance of the brain. 2.  Mild paranasal sinus inflammatory changes, decreased since 2007.   Original Report Authenticated By: Erskine Speed, M.D.   Ct Chest Wo Contrast  09/18/2012   *RADIOLOGY REPORT*  Clinical Data: Fever, elevated liver function tests  CT CHEST WITHOUT CONTRAST  Technique:  Multidetector CT imaging of the chest was performed following the standard protocol without IV contrast.  Comparison: CT abdomen/pelvis same date, chest CT 06/14/2012  Findings: Evidence of CABG noted.  Moderate atheromatous aortic calcification noted without  aneurysm.  Heart size is normal.  No pericardial or pleural effusion.  Incomplete imaging of the upper abdomen redemonstrates hepatic hypodensity compatible with steatosis.  Curvilinear left lower lobe scarring is re-identified.  Trace pleural fluid or thickening is noted.  The lungs are otherwise clear.  Central airways are patent.  Subacute left posterior seventh  and eighth rib fractures are noted.  IMPRESSION: No acute cardiopulmonary process.  Hepatic steatosis is redemonstrated, which may account for the history of elevated liver function tests.   Original Report Authenticated By: Christiana Pellant, M.D.    Scheduled Meds: . amLODipine  5 mg Oral QPM  . aspirin  81 mg Oral QPM  . bacitracin   Topical Daily  . docusate sodium  100 mg Oral BID  . doxycycline (VIBRAMYCIN) IV  100 mg Intravenous Q12H  . folic acid  1 mg Oral Daily  . labetalol  100 mg Oral BID  . lisinopril  40 mg Oral Daily  . omega-3 acid ethyl esters  1 g Oral BID  . predniSONE  5 mg Oral Q breakfast  . sodium chloride  3 mL Intravenous Q12H   Continuous Infusions:    Principal Problem:   Fever Active Problems:   Polymyalgia rheumatica   CAD (coronary artery disease)   Hyperlipidemia   HTN (hypertension)   Atrial flutter   Elevated liver enzymes    Time spent: 30 min    Shterna Laramee, J  [facility] Pager [phone]. If 7PM-7AM, please contact night-coverage at [email or web address], password [facility] 09/20/2012, 10:46 AM  LOS: 3 days

## 2012-09-20 NOTE — ED Provider Notes (Signed)
I saw and evaluated the patient, reviewed the resident's note and I agree with the findings and plan.  Sorin Frimpong T Orion Mole, MD 09/20/12 0706 

## 2012-09-20 NOTE — Progress Notes (Signed)
Pt c/o mid chest pain burning like needed to burp but unable.  Hr 140s  maalox given. EKG obtained.  Rectal temp 102.0.  bp 89/75.  New orders received.  Will give 500 cc ns bolus over 1 hr.  zofran given for nausea.  Placed back on cooling blanket.  Will continue to monitor. Emilie [place] Liter

## 2012-09-20 NOTE — Progress Notes (Addendum)
[facility]    Date of Admission:  09/17/2012   Total days of antibiotics 2        Day 2 doxycycline           ID: William [name] is a 62 y.o. male with history CAD s/p CABG, Rheumatoid Arthritis/Polymyalgia Rheumatica presents with fever of unknown origin, T102-103,   Principal Problem:   Fever Active Problems:   Polymyalgia rheumatica   CAD (coronary artery disease)   Hyperlipidemia   HTN (hypertension)   Atrial flutter   Elevated liver enzymes    Subjective: No improvement in the last 24hrs. Still has high fevers of 101-102.6 Tc, spikes every 6hrs. Has some chest pain associated with elevated HR in setting of infection. Oxygen helping his chest pain. Tylenol has helped break his fever. This morning had low bp attributed to poor po intake. He has little to drink or eat. Wife reports increased concentrated urine for the past few days.  They recall going camping with friends on 7/4 in [place] and [place], where he had onset flu like symptoms. He reports brushing a tick off his body but did not feed on him. 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 The four chickens are healthy.   Medications:  . amLODipine  5 mg Oral QPM  . aspirin  81 mg Oral QPM  . bacitracin   Topical Daily  . docusate sodium  100 mg Oral BID  . doxycycline (VIBRAMYCIN) IV  100 mg Intravenous Q12H  . folic acid  1 mg Oral Daily  . labetalol  100 mg Oral BID  . lisinopril  40 mg Oral Daily  . omega-3 acid ethyl esters  1 g Oral BID  . predniSONE  5 mg Oral Q breakfast  . sodium chloride  3 mL Intravenous Q12H    Objective: Vital signs in last 24 hours: Temp:  [97.5 F (36.4 C)-102.6 F (39.2 C)] 101.2 F (38.4 C) (07/20 1619) Pulse Rate:  [68-97] 68 (07/20 1619) Resp:  [13-26] 26 (07/20 1619) BP: (84-149)/(39-121) 113/51 mmHg (07/20 1619) SpO2:  [95 %-98 %] 98 % (07/20 1619)  Physical Exam    Constitutional: He is oriented to person, place, and time. He appears well-developed and well-nourished. No distress. Fatigue appearing ,wearing oxygen HENT: eyes injected Mouth/Throat: Oropharynx is clear and moist. No oropharyngeal exudate.  Cardiovascular: tachy, regular rhythm and normal heart sounds. Exam reveals no gallop and no friction rub.  No murmur heard.  Pulmonary/Chest: Effort normal and breath sounds normal. No respiratory distress. He has no wheezes.  Abdominal: Soft. Bowel sounds are normal. He exhibits no distension. There is no tenderness.  Lymphadenopathy:  no cervical adenopathy.  Neurological: He is alert and oriented to person, place, and time.  Skin: Skin is warm and dry. No rash noted. No erythema. Blanching rash to chest ? [place] like Psychiatric: He has a normal mood and affect. His behavior is normal.    Lab Results  Recent Labs  09/18/12 0550 09/19/12 1040 09/20/12 0957 09/20/12 1529  WBC 9.3  --   --  6.9  HGB 13.6  --   --  14.2  HCT 40.0  --   --  41.2  NA 131* 128* 128*  --   K 4.5 4.0 4.8  --   CL 99 97 95*  --   CO2 22 21 18*  --  BUN 12 10 13   --   CREATININE 1.05 0.88 0.77  --    Liver Panel  Recent Labs  09/17/12 1945 09/18/12 0550 09/19/12 1040 09/20/12 0957  PROT 6.2 5.5* 6.0 6.1  ALBUMIN 3.0* 2.7* 2.7* 2.6*  AST 83* 79* 75* 72*  ALT 151* 136* 149* 142*  ALKPHOS 110 96 96 91  BILITOT 1.0 1.0 1.1 1.3*  BILIDIR  --  0.2  --   --   IBILI  --  0.8  --   --    Sedimentation Rate  Recent Labs  09/19/12 1040  ESRSEDRATE 14   C-Reactive Protein  Recent Labs  09/18/12 1618 09/19/12 1040  CRP 7.0* 8.4*    Microbiology:  Studies/Results: Ct Head Wo Contrast  09/18/2012   *RADIOLOGY REPORT*  Clinical Data: 62 year old male with fever of unknown origin.  CT HEAD WITHOUT CONTRAST  Technique:  Contiguous axial images were obtained from the base of the skull through the vertex without contrast.  Comparison: Brain and  cervical spine MRI 10/14/2018 09/2005.  Findings: Paranasal sinus mucosal thickening has decreased, residual primarily in the left ethmoid air cells.  Mastoids are clear. No acute osseous abnormality identified.  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  Visualized orbits and scalp soft tissues are within normal limits.  Calcified atherosclerosis at the skull base.  Normal cerebral volume.  No midline shift, ventriculomegaly, mass effect, evidence of mass lesion, intracranial hemorrhage or evidence of cortically based acute infarction.  Gray-white matter differentiation is within normal limits throughout the brain.  No suspicious intracranial vascular hyperdensity.  IMPRESSION: 1. Normal noncontrast CT appearance of the brain. 2.  Mild paranasal sinus inflammatory changes, decreased since 2007.   Original Report Authenticated By: Erskine Speed, M.D.   Ct Chest Wo Contrast  09/18/2012   *RADIOLOGY REPORT*  Clinical Data: Fever, elevated liver function tests  CT CHEST WITHOUT CONTRAST  Technique:  Multidetector CT imaging of the chest was performed following the standard protocol without IV contrast.  Comparison: CT abdomen/pelvis same date, chest CT 06/14/2012  Findings: Evidence of CABG noted.  Moderate atheromatous aortic calcification noted without aneurysm.  Heart size is normal.  No pericardial or pleural effusion.  Incomplete imaging of the upper abdomen redemonstrates hepatic hypodensity compatible with steatosis.  Curvilinear left lower lobe scarring is re-identified.  Trace pleural fluid or thickening is noted.  The lungs are otherwise clear.  Central airways are patent.  Subacute left posterior seventh and eighth rib fractures are noted.  IMPRESSION: No acute cardiopulmonary process.  Hepatic steatosis is redemonstrated, which may account for the history of elevated liver function tests.   Original Report Authenticated By: Christiana Pellant, M.D.     Assessment/Plan: 62yo M with RA and PMR  on MTX 9mg  weekly and pred 5mg  daily presents fever of unknown origin starting roughly 10 days ago of sudden onset, intermittently last up to 4hrs at a time, getting as high as 102-103F. He is also found to have atypical lymphocyte of differential, mild thrombocytopenia, transaminitis, and hyponatremia. He has outdoor exposure but no recollection of tickbite. No improvement thus far after 3 doses of doxycycline. Concern for dehydration due to poor po intake and hypotension this morning, in addition to now having angina associated with fevers.  - continue on doxycycline for presumed tick borne illness - since abd ct suggests some perinephric stranding and patient reports new low back pain, will also add gram negative coverage with cefepime empirically until more culture results  return - for hypotension, patient has poor po intake and insensible loss from fevers, please keep up on IVF - will also get RVP for evaluating any viral causes for chronic nonproductive cough  Dr Orvan Falconer to provide further recs on monday   Sevag Shearn, [facility] for Infectious Diseases Cell: [phone] Pager: [phone]  09/20/2012, 6:03 PM

## 2012-09-20 NOTE — Progress Notes (Signed)
ANTIBIOTIC CONSULT NOTE - INITIAL  Pharmacy Consult for Cefepime Indication: Gram negative coverage  Allergies  Allergen Reactions  . Oxycodone Itching  . Iodine Itching  . Zocor (Simvastatin) Other (See Comments)    Pain    Patient Measurements: Height: 5\' 11"  (180.3 cm) Weight: 246 lb (111.585 kg) (scale b) IBW/kg (Calculated) : 75.3 Adjusted Body Weight:   Vital Signs: Temp: 101.2 F (38.4 C) (07/20 1619) Temp src: Oral (07/20 1619) BP: 113/51 mmHg (07/20 1619) Pulse Rate: 68 (07/20 1619) Intake/Output from previous day: 07/19 0701 - 07/20 0700 In: 2390 [P.O.:1390; IV Piggyback:1000] Out: 1950 [Urine:1950] Intake/Output from this shift: Total I/O In: 1175 [P.O.:720; I.V.:205; IV Piggyback:250] Out: 875 [Urine:875]  Labs:  Recent Labs  09/17/12 1945 09/18/12 0550 09/19/12 1040 09/20/12 0957 09/20/12 1529  WBC 10.9* 9.3  --   --  6.9  HGB 15.7 13.6  --   --  14.2  PLT 198 174  --   --  204  CREATININE 1.10 1.05 0.88 0.77  --    Estimated Creatinine Clearance: 120 ml/min (by C-G formula based on Cr of 0.77). No results found for this basename: VANCOTROUGH, VANCOPEAK, VANCORANDOM, GENTTROUGH, GENTPEAK, GENTRANDOM, TOBRATROUGH, TOBRAPEAK, TOBRARND, AMIKACINPEAK, AMIKACINTROU, AMIKACIN,  in the last 72 hours   Microbiology: Recent Results (from the past 720 hour(s))  CULTURE, BLOOD (ROUTINE X 2)     Status: None   Collection Time    09/17/12  7:40 PM      Result Value Range Status   Specimen Description BLOOD LEFT ANTECUBITAL   Final   Special Requests BOTTLES DRAWN AEROBIC AND ANAEROBIC 10CC   Final   Culture  Setup Time 09/18/2012 02:01   Final   Culture     Final   Value:        BLOOD CULTURE RECEIVED NO GROWTH TO DATE CULTURE WILL BE HELD FOR 5 DAYS BEFORE ISSUING A FINAL NEGATIVE REPORT   Report Status PENDING   Incomplete  CULTURE, BLOOD (ROUTINE X 2)     Status: None   Collection Time    09/17/12  7:47 PM      Result Value Range Status   Specimen  Description BLOOD RIGHT ANTECUBITAL   Final   Special Requests BOTTLES DRAWN AEROBIC AND ANAEROBIC 10CC   Final   Culture  Setup Time 09/18/2012 02:01   Final   Culture     Final   Value:        BLOOD CULTURE RECEIVED NO GROWTH TO DATE CULTURE WILL BE HELD FOR 5 DAYS BEFORE ISSUING A FINAL NEGATIVE REPORT   Report Status PENDING   Incomplete  URINE CULTURE     Status: None   Collection Time    09/17/12  7:50 PM      Result Value Range Status   Specimen Description URINE, RANDOM   Final   Special Requests NONE   Final   Culture  Setup Time 09/17/2012 21:06   Final   Colony Count NO GROWTH   Final   Culture NO GROWTH   Final   Report Status 09/18/2012 FINAL   Final  MRSA PCR SCREENING     Status: None   Collection Time    09/18/12  6:01 PM      Result Value Range Status   MRSA by PCR NEGATIVE  NEGATIVE Final   Comment:            The GeneXpert MRSA Assay (FDA     approved for NASAL  specimens     only), is one component of a     comprehensive MRSA colonization     surveillance program. It is not     intended to diagnose MRSA     infection nor to guide or     monitor treatment for     MRSA infections.  CULTURE, BLOOD (ROUTINE X 2)     Status: None   Collection Time    09/19/12 10:40 AM      Result Value Range Status   Specimen Description BLOOD LEFT ARM   Final   Special Requests BOTTLES DRAWN AEROBIC ONLY 6CC   Final   Culture  Setup Time 09/19/2012 20:32   Final   Culture     Final   Value:        BLOOD CULTURE RECEIVED NO GROWTH TO DATE CULTURE WILL BE HELD FOR 5 DAYS BEFORE ISSUING A FINAL NEGATIVE REPORT   Report Status PENDING   Incomplete  CULTURE, BLOOD (ROUTINE X 2)     Status: None   Collection Time    09/19/12 10:45 AM      Result Value Range Status   Specimen Description BLOOD RIGHT ARM   Final   Special Requests BOTTLES DRAWN AEROBIC ONLY 10CC   Final   Culture  Setup Time 09/19/2012 20:32   Final   Culture     Final   Value:        BLOOD CULTURE RECEIVED NO  GROWTH TO DATE CULTURE WILL BE HELD FOR 5 DAYS BEFORE ISSUING A FINAL NEGATIVE REPORT   Report Status PENDING   Incomplete    Medical History: Past Medical History  Diagnosis Date  . Coronary disease   . Hypercholesterolemia   . Polymyalgia rheumatica   . Bronchitis   . Insomnia   . Tobacco abuse   . History of chest pain   . Hypertension   . Coronary artery disease   . Myocardial infarction   . Shortness of breath   . Pneumonia   . Headache(784.0)   . Atrial flutter 09/19/2012    Medications:  Scheduled:  . amLODipine  5 mg Oral QPM  . aspirin  81 mg Oral QPM  . bacitracin   Topical Daily  . docusate sodium  100 mg Oral BID  . doxycycline (VIBRAMYCIN) IV  100 mg Intravenous Q12H  . folic acid  1 mg Oral Daily  . ibuprofen  600 mg Oral Once  . labetalol  100 mg Oral BID  . lisinopril  40 mg Oral Daily  . omega-3 acid ethyl esters  1 g Oral BID  . predniSONE  5 mg Oral Q breakfast  . sodium chloride  3 mL Intravenous Q12H   Assessment: 62yo male on Methotrexate weekly for RA and prednisone daily who is currently on Doxycycline for presumed tick-born illness.  He has continued to have fevers to 102-103 and now c/o low back pain with perinephric stranding on abdominal CT.  Cefepime to be added for Gm(-) coverage.  Cr < 1.  Goal of Therapy:  resolution of infection  Plan:  1.  Cefepime 2gm IV q8 2.  F/u culture results and renal function.  Marisue Humble, PharmD Clinical Pharmacist [facility]

## 2012-09-20 NOTE — Progress Notes (Signed)
[facility] progress note. Chief complaint. Tachycardia, borderline hypotension, chest pain. History of present illness. This 62 year old male in hospital with fever unknown origin followed by infectious disease. Concern is for tickborne illness versus noninfectious causes. Patient does have a history of coronary artery disease with prior CABG. Patient was sitting in his chair at bedside and developed mid sternal burning pain. Patient to was given Maalox by nursing as the patient is suspected this may be indigestion. A 12-lead EKG was obtained showing sinus tachycardia with a rate in the 140s. I asked nursing to obtain a rectal temperature and in fact this was found to be elevated to 102. By the time I arrived at bedside to assess the patient he was reporting being chest pain free. Earlier there was some associated nausea but no diaphoresis or radiation of pain. Vital signs. Temperature 102, pulse now 110 apically, respirations 20, blood pressure 89/75. His sats 96%. General appearance. Well-developed elderly male who is alert, cooperative, and in no distress. Cardiac. Tachycardic with rate 110 apically. No jugular venous distention or edema. Lungs. Breath sounds clear and equal. Abdomen. Soft with positive bowel sounds. Mild diffuse pain with palpation. Impression/plan Problem #1. Burning chest pain. 12-lead EKG obtained at the bedside does show tachycardia but no evidence of ischemia when compared to prior EKG. Patient is currently chest pain-free status post Maalox. Will repeat troponin now then every 6 hours for a total of 3 sets. We'll repeat a 12-lead EKG in about 6 hours. Problem #2. Fever. Likely explains current tachycardia. Fever management as per nursing Problem #3 borderline hypotension. Will give some extra fluid with a 500 cc normal saline bolus over one hour. Nursing will notify me if followup blood pressure does not respond.

## 2012-09-20 NOTE — Progress Notes (Signed)
Notified Md about pts request for stool softner.  New orders received.  William [name]

## 2012-09-21 ENCOUNTER — Other Ambulatory Visit: Payer: Self-pay

## 2012-09-21 LAB — QUANTIFERON TB GOLD ASSAY (BLOOD)
Interferon Gamma Release Assay: NEGATIVE
Mitogen value: 9.26 IU/mL
Quantiferon Nil Value: 1.47 IU/mL
TB Ag value: 1.53 IU/mL
TB Antigen Minus Nil Value: 0.06 IU/mL

## 2012-09-21 LAB — CBC WITH DIFFERENTIAL/PLATELET
Eosinophils Relative: 2 % (ref 0–5)
HCT: 39.8 % (ref 39.0–52.0)
Lymphs Abs: 3.8 10*3/uL (ref 0.7–4.0)
MCV: 91.7 fL (ref 78.0–100.0)
Monocytes Relative: 12 % (ref 3–12)
Neutro Abs: 3.4 10*3/uL (ref 1.7–7.7)
RBC: 4.34 MIL/uL (ref 4.22–5.81)
RDW: 13.7 % (ref 11.5–15.5)
WBC: 8.7 10*3/uL (ref 4.0–10.5)

## 2012-09-21 LAB — COMPREHENSIVE METABOLIC PANEL
AST: 46 U/L — ABNORMAL HIGH (ref 0–37)
Albumin: 2.2 g/dL — ABNORMAL LOW (ref 3.5–5.2)
Chloride: 94 mEq/L — ABNORMAL LOW (ref 96–112)
Creatinine, Ser: 0.91 mg/dL (ref 0.50–1.35)
Potassium: 3.5 mEq/L (ref 3.5–5.1)
Sodium: 126 mEq/L — ABNORMAL LOW (ref 135–145)
Total Bilirubin: 0.9 mg/dL (ref 0.3–1.2)

## 2012-09-21 LAB — FERRITIN: Ferritin: 1279 ng/mL — ABNORMAL HIGH (ref 22–322)

## 2012-09-21 LAB — ANA: Anti Nuclear Antibody(ANA): NEGATIVE

## 2012-09-21 LAB — B. BURGDORFI ANTIBODIES
B burgdorferi Ab IgG+IgM: 0.82 {ISR}
B burgdorferi Ab IgG+IgM: 0.87 {ISR}

## 2012-09-21 LAB — PROCALCITONIN: Procalcitonin: 0.74 ng/mL

## 2012-09-21 MED ORDER — PANTOPRAZOLE SODIUM 40 MG PO TBEC
40.0000 mg | DELAYED_RELEASE_TABLET | Freq: Every day | ORAL | Status: DC
  Administered 2012-09-21 – 2012-09-23 (×3): 40 mg via ORAL
  Filled 2012-09-21 (×3): qty 1

## 2012-09-21 MED ORDER — TRAMADOL HCL 50 MG PO TABS
50.0000 mg | ORAL_TABLET | Freq: Four times a day (QID) | ORAL | Status: DC | PRN
  Filled 2012-09-21 (×2): qty 2

## 2012-09-21 NOTE — Progress Notes (Signed)
Patient ID: William [name], male   DOB: [birth year], 62 y.o.   MRN: [id number]         [facility]    Date of Admission:  09/17/2012   Total days of antibiotics 10         Principal Problem:   Fever Active Problems:   Polymyalgia rheumatica   CAD (coronary artery disease)   Hyperlipidemia   HTN (hypertension)   Atrial flutter   Elevated liver enzymes   . amLODipine  5 mg Oral QPM  . aspirin  81 mg Oral QPM  . bacitracin   Topical Daily  . ceFEPime (MAXIPIME) IV  2 g Intravenous Q8H  . docusate sodium  100 mg Oral BID  . doxycycline (VIBRAMYCIN) IV  100 mg Intravenous Q12H  . folic acid  1 mg Oral Daily  . labetalol  100 mg Oral BID  . lisinopril  40 mg Oral Daily  . omega-3 acid ethyl esters  1 g Oral BID  . predniSONE  5 mg Oral Q breakfast  . sodium chloride  3 mL Intravenous Q12H    Subjective: He is feeling about the same. He continues to note fevers, chills and sweats. He does not feel like these have changed at all with antibiotics over the past 10 days. He has some aching in his knees and he has some new low back pain which he has never had before. His low back pain has been up to 7/10. His fevers began 2 days after having a basal cell carcinoma resected from his back. He does not have any unusual pain or drainage from his wound. He has only mild intermittent headache. He has no sore throat or sinus congestion. He has an occasional dry cough which is unchanged. He has no new shortness of breath. He has had some burning epigastric and chest pain recently. He has not had any nausea, vomiting or diarrhea. He feels constipated. Review of Systems: Constitutional: positive for chills, fevers, malaise and sweats, negative for weight loss Eyes: negative Ears, nose, mouth, throat, and face: negative Respiratory: negative Cardiovascular: positive for chest pressure/discomfort and irregular heart beat, negative for dyspnea, exertional chest  pressure/discomfort, lower extremity edema and paroxysmal nocturnal dyspnea Gastrointestinal: negative Genitourinary:negative Integument/breast: negative  Past Medical History  Diagnosis Date  . Coronary disease   . Hypercholesterolemia   . Polymyalgia rheumatica   . Bronchitis   . Insomnia   . Tobacco abuse   . History of chest pain   . Hypertension   . Coronary artery disease   . Myocardial infarction   . Shortness of breath   . Pneumonia   . Headache(784.0)   . Atrial flutter 09/19/2012    History  Substance Use Topics  . Smoking status: Current Some Day Smoker    Types: Cigars  . Smokeless tobacco: Not on file  . Alcohol Use: 0.6 oz/week    1 Cans of beer per week    Family History  Problem Relation Age of Onset  . Pancreatic cancer Mother   . Bladder Cancer Father     Allergies  Allergen Reactions  . Oxycodone Itching  . Iodine Itching  . Zocor (Simvastatin) Other (See Comments)    Pain    Objective: Temp:  [97.8 F (36.6 C)-102.1 F (38.9 C)] 98 F (36.7 C) (07/21 0801) Pulse Rate:  [68-108] 81 (07/21 0801) Resp:  [13-26] 20 (07/21 0801) BP: (81-149)/(43-113) 109/66 mmHg (07/21 0801) SpO2:  [96 %-98 %]  98 % (07/21 0801) Weight:  [112.5 kg (248 lb 0.3 oz)] 112.5 kg (248 lb 0.3 oz) (07/21 0504)  General: Appears uncomfortable Skin: He appears flushed but his he and his family say that is his normal skin tone. He has an elliptical 2 inch incision on his right upper back without any evidence of infection Lungs: Clear Cor: Very distant heart sounds no murmur heard Abdomen: Obese, soft and nontender. Quiet bowel sounds. No masses palpated Neuro: Alert with normal speech and conversation Joints and extremities: No acute changes noted  Lab Results Lab Results  Component Value Date   WBC 8.7 09/21/2012   HGB 14.0 09/21/2012   HCT 39.8 09/21/2012   MCV 91.7 09/21/2012   PLT 191 09/21/2012    Lab Results  Component Value Date   CREATININE 0.91  09/21/2012   BUN 17 09/21/2012   NA 126* 09/21/2012   K 3.5 09/21/2012   CL 94* 09/21/2012   CO2 25 09/21/2012    Lab Results  Component Value Date   ALT 100* 09/21/2012   AST 46* 09/21/2012   ALKPHOS 77 09/21/2012   BILITOT 0.9 09/21/2012      Microbiology: Recent Results (from the past 240 hour(s))  CULTURE, BLOOD (ROUTINE X 2)     Status: None   Collection Time    09/17/12  7:40 PM      Result Value Range Status   Specimen Description BLOOD LEFT ANTECUBITAL   Final   Special Requests BOTTLES DRAWN AEROBIC AND ANAEROBIC 10CC   Final   Culture  Setup Time 09/18/2012 02:01   Final   Culture     Final   Value:        BLOOD CULTURE RECEIVED NO GROWTH TO DATE CULTURE WILL BE HELD FOR 5 DAYS BEFORE ISSUING A FINAL NEGATIVE REPORT   Report Status PENDING   Incomplete  CULTURE, BLOOD (ROUTINE X 2)     Status: None   Collection Time    09/17/12  7:47 PM      Result Value Range Status   Specimen Description BLOOD RIGHT ANTECUBITAL   Final   Special Requests BOTTLES DRAWN AEROBIC AND ANAEROBIC 10CC   Final   Culture  Setup Time 09/18/2012 02:01   Final   Culture     Final   Value:        BLOOD CULTURE RECEIVED NO GROWTH TO DATE CULTURE WILL BE HELD FOR 5 DAYS BEFORE ISSUING A FINAL NEGATIVE REPORT   Report Status PENDING   Incomplete  URINE CULTURE     Status: None   Collection Time    09/17/12  7:50 PM      Result Value Range Status   Specimen Description URINE, RANDOM   Final   Special Requests NONE   Final   Culture  Setup Time 09/17/2012 21:06   Final   Colony Count NO GROWTH   Final   Culture NO GROWTH   Final   Report Status 09/18/2012 FINAL   Final  MRSA PCR SCREENING     Status: None   Collection Time    09/18/12  6:01 PM      Result Value Range Status   MRSA by PCR NEGATIVE  NEGATIVE Final   Comment:            The GeneXpert MRSA Assay (FDA     approved for NASAL specimens     only), is one component of a     comprehensive MRSA colonization  surveillance program. It  is not     intended to diagnose MRSA     infection nor to guide or     monitor treatment for     MRSA infections.  CULTURE, BLOOD (ROUTINE X 2)     Status: None   Collection Time    09/19/12 10:40 AM      Result Value Range Status   Specimen Description BLOOD LEFT ARM   Final   Special Requests BOTTLES DRAWN AEROBIC ONLY 6CC   Final   Culture  Setup Time 09/19/2012 20:32   Final   Culture     Final   Value:        BLOOD CULTURE RECEIVED NO GROWTH TO DATE CULTURE WILL BE HELD FOR 5 DAYS BEFORE ISSUING A FINAL NEGATIVE REPORT   Report Status PENDING   Incomplete  CULTURE, BLOOD (ROUTINE X 2)     Status: None   Collection Time    09/19/12 10:45 AM      Result Value Range Status   Specimen Description BLOOD RIGHT ARM   Final   Special Requests BOTTLES DRAWN AEROBIC ONLY 10CC   Final   Culture  Setup Time 09/19/2012 20:32   Final   Culture     Final   Value:        BLOOD CULTURE RECEIVED NO GROWTH TO DATE CULTURE WILL BE HELD FOR 5 DAYS BEFORE ISSUING A FINAL NEGATIVE REPORT   Report Status PENDING   Incomplete   Assessment: He has fever of unknown origin unresponsive to antibiotics. He also has some liver enzyme elevation which is new. I will discontinue his empiric antibiotics since they do not seem to be helping and this may allow [place] to obtain repeat blood cultures off antibiotics. He has several serologic studies pending. He needs to have a CMV IgM antibody drawn to look for evidence of acute cytomegalovirus infection.  Plan: 1. Discontinue antibiotics for now 2. Check a CMV IgM antibody 3. Await other serologic tests are pending 4. Will try to locate results of lab work from [facility] in [place], [place]  Cliffton Asters, MD [facility] [phone] pager   [phone] cell 09/21/2012, 11:39 AM

## 2012-09-22 DIAGNOSIS — E871 Hypo-osmolality and hyponatremia: Secondary | ICD-10-CM

## 2012-09-22 LAB — COMPREHENSIVE METABOLIC PANEL
AST: 50 U/L — ABNORMAL HIGH (ref 0–37)
Albumin: 2.2 g/dL — ABNORMAL LOW (ref 3.5–5.2)
Alkaline Phosphatase: 76 U/L (ref 39–117)
Chloride: 97 mEq/L (ref 96–112)
Creatinine, Ser: 0.95 mg/dL (ref 0.50–1.35)
Potassium: 4.5 mEq/L (ref 3.5–5.1)
Sodium: 129 mEq/L — ABNORMAL LOW (ref 135–145)
Total Bilirubin: 1.1 mg/dL (ref 0.3–1.2)

## 2012-09-22 LAB — RESPIRATORY VIRUS PANEL
Influenza A: NOT DETECTED
Influenza B: NOT DETECTED
Parainfluenza 1: NOT DETECTED
Parainfluenza 2: NOT DETECTED
Respiratory Syncytial Virus B: NOT DETECTED

## 2012-09-22 LAB — PROTEIN ELECTROPHORESIS, SERUM
Albumin ELP: 51.5 % — ABNORMAL LOW (ref 55.8–66.1)
Beta 2: 6.1 % (ref 3.2–6.5)
Beta 2: 5.4 % (ref 3.2–6.5)
Beta Globulin: 5.1 % (ref 4.7–7.2)
Beta Globulin: 4.9 % (ref 4.7–7.2)
Gamma Globulin: 16.1 % (ref 11.1–18.8)
Gamma Globulin: 16.8 % (ref 11.1–18.8)
M-Spike, %: 0.38 g/dL

## 2012-09-22 LAB — CBC WITH DIFFERENTIAL/PLATELET
Band Neutrophils: 10 % (ref 0–10)
Basophils Absolute: 0.1 10*3/uL (ref 0.0–0.1)
Eosinophils Absolute: 0.2 10*3/uL (ref 0.0–0.7)
HCT: 38.3 % — ABNORMAL LOW (ref 39.0–52.0)
MCV: 93 fL (ref 78.0–100.0)
Monocytes Relative: 10 % (ref 3–12)
Neutro Abs: 3.7 10*3/uL (ref 1.7–7.7)
RDW: 13.7 % (ref 11.5–15.5)
WBC: 9.3 10*3/uL (ref 4.0–10.5)

## 2012-09-22 LAB — T4, FREE: Free T4: 1.3 ng/dL (ref 0.80–1.80)

## 2012-09-22 LAB — CMV IGM: CMV IgM: >240 AU/mL — ABNORMAL HIGH (ref <30.00)

## 2012-09-22 LAB — CHLORIDE, URINE, RANDOM: Chloride Urine: 35 mEq/L

## 2012-09-22 LAB — OSMOLALITY, URINE: Osmolality, Ur: 312 mOsm/kg — ABNORMAL LOW (ref 390–1090)

## 2012-09-22 MED ORDER — BLISTEX EX OINT
TOPICAL_OINTMENT | CUTANEOUS | Status: DC | PRN
  Filled 2012-09-22: qty 10

## 2012-09-22 MED ORDER — OXYCODONE HCL 5 MG PO TABS
ORAL_TABLET | ORAL | Status: AC
  Filled 2012-09-22: qty 1

## 2012-09-22 MED ORDER — HYDROMORPHONE HCL PF 1 MG/ML IJ SOLN
INTRAMUSCULAR | Status: AC
  Filled 2012-09-22: qty 1

## 2012-09-22 NOTE — Progress Notes (Addendum)
[facility] PROGRESS NOTE  ZAKAREE MCCLENAHAN NWG:[id number] DOB: 09-Jun-1950 DOA: 09/17/2012 PCP: Vivien Presto, MD  Assessment/Plan: 1. FUO: Patient continues to have chills/rigors proximally every 6 hours. Patient is immunocompromised secondary to being on chronic steroids for PMR area will begin a FU0 workup. Obtain ESR, CRP LDH, TB Quantriferon test, Serum protein electrophoresis, creatine kinase, RF, ANA, we'll also obtain CT scan of chest and head.. Will consider LP. Transfer to ward 2900 so we can use Cooling Blanket. Hold ALL ABx    2. PMR; Continue Home meds  3.  Rash; resolved   4. HTN: Under control on current medication although patient does continue to have some episodes of tachycardia      5. Tickborne illness; spoke with Dr. Judyann Munson (infectious disease) [phone] pgr reviewed findings and health care plan to date. She concurs with plan now that we have drawn all cultures requested we restart doxycycline. Dr.Snider(infectious disease ) has stopped doxycycline again secondary to no response still awaiting micro-labs.  6. elevated liver enzymes; continue to be elevated even with cessation of Tylenol. Continue to monitor her liver enzymes  7. Hyponatremia; unsure of cause will begin hyponatremia workup  Code Status: Full Family Communication: Wife and Daughter present for care Plan Disposition Plan: ?     HPI/Subjective: HPI: William [name] is a 62 y.o. WM PMHx polymyalgia rheumatica, CAD status post CABG, hyperlipidemia and hypertension has been experiencing fever and chills over the last one week. Patient had come to the ER 2 days after his symptoms had started and was placed on doxycycline. Patient on doxycycline for 2 days after which his fever subsided and he had gone to [place]. Over there he started spiking fever again and he had gone to the ER and was placed on Bactrim empirically. As per the family workup done over there was negative for any source.  Despite taking Bactrim for last 2-3 days patient still has fever and had come to the ER today. Patient states that she has had a skin biopsy and excision done prior to this fever episode. Results of the biopsy are pending. Prior to the episode also patient has been having some joint aches and his rheumatology was planning degrees his prednisone from 5 mg to 10 which was not done because patient started developing fever and his rheumatologist wanted to make sure there was no infection. Patient otherwise denies any chest pain shortness of breath nausea vomiting diarrhea. Has been having chronic nonproductive cough. Over the last few days patient also has been having lower abdominal pain. Denies any dysuria or discharges. The first 2 days of fever patient had any and retro-orbital pain which at this time is completely resolved and does not have any neck rigidity of pain. In the ER patient's LFTs are found to be mildly elevated. Patient denies to have taking any new medication. Denies any insect bites. However patient does camp in [place] on further questioning stay had 4 days of doxycycline, 1 day of Bactrim, one day of Zosyn/vancomycin (administered on 09/17/2012). OVERNIGHT patient had episode of fever(38.9) associated with Rigors and chest pain cardiac enzymes obtained and were negative     Ct Abd and Pelvis 09/18/2013 1. No acute abnormality seen within the abdomen or pelvis.  2. Nonspecific perinephric stranding noted bilaterally; small  bilateral parapelvic renal cysts seen.  3. Scattered calcification along the abdominal aorta and its  branches.  4. Small left inguinal hernia, containing only fat.   CT chest without contrast 09/18/2012  No acute cardiopulmonary process.  Hepatic steatosis is redemonstrated, which may account for the  history of elevated liver function tests.  CT head without contrast 09/18/2012 1. Normal noncontrast CT appearance of the brain.  2. Mild paranasal sinus inflammatory  changes, decreased since  2007.       Objective: Filed Vitals:   09/21/12 2124 09/22/12 0021 09/22/12 0350 09/22/12 0500  BP:  84/47 126/90   Pulse:      Temp: 99.1 F (37.3 C) 98.2 F (36.8 C) 99 F (37.2 C) 101.4 F (38.6 C)  TempSrc: Oral Oral Oral Oral  Resp:      Height:      Weight:      SpO2:  95% 98%     Intake/Output Summary (Last 24 hours) at 09/22/12 4782 Last data filed at 09/22/12 0600  Gross per 24 hour  Intake   2185 ml  Output   1610 ml  Net    575 ml   Filed Weights   09/17/12 1823 09/18/12 0025 09/21/12 0504  Weight: 109.77 kg (242 lb) 111.585 kg (246 lb) 112.5 kg (248 lb 0.3 oz)   FHx; mother; lung cancer/ gastric cancer/Pnacreas Ca          FATHER; BLADDER cA    Exam:   General:  Alert, No Rigors  Cardiovascular: RR Tachycardic, (-) M/R/G, DP/PT pulse 2+ bilat  Respiratory: CTA Bilat  Abdomen: Soft, NT, ND, (+) BS       Musculoskeletal: (-) Pedal Edema Bilat,    Data Reviewed: Basic Metabolic Panel:  Recent Labs Lab 09/18/12 0550 09/19/12 1040 09/20/12 0957 09/21/12 0455 09/22/12 0350  NA 131* 128* 128* 126* 129*  K 4.5 4.0 4.8 3.5 4.5  CL 99 97 95* 94* 97  CO2 22 21 18* 25 24  GLUCOSE 105* 151* 103* 108* 94  BUN 12 10 13 17 14   CREATININE 1.05 0.88 0.77 0.91 0.95  CALCIUM 8.6 8.5 8.8 8.2* 8.0*  MG  --  2.0 2.0 2.0 2.1   Liver Function Tests:  Recent Labs Lab 09/18/12 0550 09/19/12 1040 09/20/12 0957 09/21/12 0455 09/22/12 0350  AST 79* 75* 72* 46* 50*  ALT 136* 149* 142* 100* 89*  ALKPHOS 96 96 91 77 76  BILITOT 1.0 1.1 1.3* 0.9 1.1  PROT 5.5* 6.0 6.1 5.2* 5.2*  ALBUMIN 2.7* 2.7* 2.6* 2.2* 2.2*   No results found for this basename: LIPASE, AMYLASE,  in the last 168 hours No results found for this basename: AMMONIA,  in the last 168 hours CBC:  Recent Labs Lab 09/17/12 1945 09/18/12 0550 09/20/12 1529 09/21/12 0455 09/22/12 0350  WBC 10.9* 9.3 6.9 8.7 9.3  NEUTROABS 6.7 4.6 3.6 3.4 3.7  HGB 15.7  13.6 14.2 14.0 13.5  HCT 44.2 40.0 41.2 39.8 38.3*  MCV 93.6 94.1 93.2 91.7 93.0  PLT 198 174 204 191 227   Cardiac Enzymes:  Recent Labs Lab 09/18/12 1619  09/19/12 1051 09/19/12 1556 09/19/12 2225 09/20/12 0340 09/20/12 0957 09/20/12 1529  CKTOTAL 91  --  111  --   --   --   --   --   CKMB 1.9  --  3.2  --   --   --   --   --   TROPONINI  --   < > <0.30 <0.30 <0.30 <0.30 <0.30 <0.30  < > = values in this interval not displayed. BNP (last 3 results) No results found for this basename: PROBNP,  in the last  8760 hours CBG: No results found for this basename: GLUCAP,  in the last 168 hours  Recent Results (from the past 240 hour(s))  CULTURE, BLOOD (ROUTINE X 2)     Status: None   Collection Time    09/17/12  7:40 PM      Result Value Range Status   Specimen Description BLOOD LEFT ANTECUBITAL   Final   Special Requests BOTTLES DRAWN AEROBIC AND ANAEROBIC 10CC   Final   Culture  Setup Time 09/18/2012 02:01   Final   Culture     Final   Value:        BLOOD CULTURE RECEIVED NO GROWTH TO DATE CULTURE WILL BE HELD FOR 5 DAYS BEFORE ISSUING A FINAL NEGATIVE REPORT   Report Status PENDING   Incomplete  CULTURE, BLOOD (ROUTINE X 2)     Status: None   Collection Time    09/17/12  7:47 PM      Result Value Range Status   Specimen Description BLOOD RIGHT ANTECUBITAL   Final   Special Requests BOTTLES DRAWN AEROBIC AND ANAEROBIC 10CC   Final   Culture  Setup Time 09/18/2012 02:01   Final   Culture     Final   Value:        BLOOD CULTURE RECEIVED NO GROWTH TO DATE CULTURE WILL BE HELD FOR 5 DAYS BEFORE ISSUING A FINAL NEGATIVE REPORT   Report Status PENDING   Incomplete  URINE CULTURE     Status: None   Collection Time    09/17/12  7:50 PM      Result Value Range Status   Specimen Description URINE, RANDOM   Final   Special Requests NONE   Final   Culture  Setup Time 09/17/2012 21:06   Final   Colony Count NO GROWTH   Final   Culture NO GROWTH   Final   Report Status  09/18/2012 FINAL   Final  MRSA PCR SCREENING     Status: None   Collection Time    09/18/12  6:01 PM      Result Value Range Status   MRSA by PCR NEGATIVE  NEGATIVE Final   Comment:            The GeneXpert MRSA Assay (FDA     approved for NASAL specimens     only), is one component of a     comprehensive MRSA colonization     surveillance program. It is not     intended to diagnose MRSA     infection nor to guide or     monitor treatment for     MRSA infections.  CULTURE, BLOOD (ROUTINE X 2)     Status: None   Collection Time    09/19/12 10:40 AM      Result Value Range Status   Specimen Description BLOOD LEFT ARM   Final   Special Requests BOTTLES DRAWN AEROBIC ONLY [facility]   Final   Culture  Setup Time 09/19/2012 20:32   Final   Culture     Final   Value:        BLOOD CULTURE RECEIVED NO GROWTH TO DATE CULTURE WILL BE HELD FOR 5 DAYS BEFORE ISSUING A FINAL NEGATIVE REPORT   Report Status PENDING   Incomplete  CULTURE, BLOOD (ROUTINE X 2)     Status: None   Collection Time    09/19/12 10:45 AM      Result Value Range Status   Specimen Description BLOOD  RIGHT ARM   Final   Special Requests BOTTLES DRAWN AEROBIC ONLY 10CC   Final   Culture  Setup Time 09/19/2012 20:32   Final   Culture     Final   Value:        BLOOD CULTURE RECEIVED NO GROWTH TO DATE CULTURE WILL BE HELD FOR 5 DAYS BEFORE ISSUING A FINAL NEGATIVE REPORT   Report Status PENDING   Incomplete     Studies: No results found.  Scheduled Meds: . amLODipine  5 mg Oral QPM  . aspirin  81 mg Oral QPM  . bacitracin   Topical Daily  . docusate sodium  100 mg Oral BID  . folic acid  1 mg Oral Daily  . labetalol  100 mg Oral BID  . lisinopril  40 mg Oral Daily  . omega-3 acid ethyl esters  1 g Oral BID  . pantoprazole  40 mg Oral Daily  . predniSONE  5 mg Oral Q breakfast  . sodium chloride  3 mL Intravenous Q12H   Continuous Infusions: . sodium chloride 75 mL/hr at 09/20/12 1900    Principal Problem:    Fever Active Problems:   Polymyalgia rheumatica   CAD (coronary artery disease)   Hyperlipidemia   HTN (hypertension)   Atrial flutter   Elevated liver enzymes    Time spent: 30 min    Nawal Burling, J  [facility] Pager [phone]. If 7PM-7AM, please contact night-coverage at [email or web address], password [facility] 09/22/2012, 7:09 AM  LOS: 5 days

## 2012-09-22 NOTE — Progress Notes (Signed)
[facility] PROGRESS NOTE  William [name]:[id number] DOB: 03-29-1950 DOA: 09/17/2012 PCP: Vivien Presto, MD  Assessment/Plan: 1. FUO: Positive for IgM/IgG CMV antibodies. Discussed w/ Dr Bonnita Nasuti about starting Valacyclovir. Dr Bonnita Nasuti recommended not starting and letting Dz procces run it's course. Counseled family on findings and that Dr Bonnita Nasuti would be dn this afternoon to speak with them.  2. HTN; Under control on current medication although patient does continue to have some episodes of tachycardia     3. PMR; counseled that Dr. Orvan Falconer would most likely have him stop some of his immunosuppressant medication during his acute illness 4. elevated liver enzymes; resolving  5.  Hyponatremia; have drawn hyponatremia labs, but appears to be resolving on its own .    CMV IgG 4.6 U/ml and IgM> 240 AU/ml both positive.  ESR; normal CRP; High  LDH; High TB Quantriferon test Serum protein electrophoresis creatine kinase RF; (-)  ANA, CT scan of chest and head(-) for acute process  Code Status: Full Family Communication: Wife and Daughter present for care Plan Disposition Plan: ?     HPI/Subjective: HPI: William [name] is a 62 y.o. WM PMHx polymyalgia rheumatica, CAD status post CABG, hyperlipidemia and hypertension has been experiencing fever and chills over the last one week. Patient had come to the ER 2 days after his symptoms had started and was placed on doxycycline. Patient on doxycycline for 2 days after which his fever subsided and he had gone to [place]. Over there he started spiking fever again and he had gone to the ER and was placed on Bactrim empirically. As per the family workup done over there was negative for any source. Despite taking Bactrim for last 2-3 days patient still has fever and had come to the ER today. Patient states that she has had a skin biopsy and excision done prior to this fever episode. Results of the biopsy are pending. Prior to the  episode also patient has been having some joint aches and his rheumatology was planning degrees his prednisone from 5 mg to 10 which was not done because patient started developing fever and his rheumatologist wanted to make sure there was no infection. Patient otherwise denies any chest pain shortness of breath nausea vomiting diarrhea. Has been having chronic nonproductive cough. Over the last few days patient also has been having lower abdominal pain. Denies any dysuria or discharges. The first 2 days of fever patient had any and retro-orbital pain which at this time is completely resolved and does not have any neck rigidity of pain. In the ER patient's LFTs are found to be mildly elevated. Patient denies to have taking any new medication. Denies any insect bites. However patient does camp in [place] on further questioning stay had 4 days of doxycycline, 1 day of Bactrim, one day of Zosyn/vancomycin (administered on 09/17/2012). OVERNIGHT the patient slept better.    Ct Abd and Pelvis 09/18/2013 1. No acute abnormality seen within the abdomen or pelvis.  2. Nonspecific perinephric stranding noted bilaterally; small  bilateral parapelvic renal cysts seen.  3. Scattered calcification along the abdominal aorta and its  branches.  4. Small left inguinal hernia, containing only fat.   CT chest without contrast 09/18/2012  No acute cardiopulmonary process.  Hepatic steatosis is redemonstrated, which may account for the  history of elevated liver function tests.  CT head without contrast 09/18/2012 1. Normal noncontrast CT appearance of the brain.  2. Mild paranasal sinus inflammatory changes, decreased since  2007.  Objective: Filed Vitals:   09/22/12 0350 09/22/12 0500 09/22/12 0805 09/22/12 0806  BP: 126/90  92/63 92/63  Pulse:      Temp: 99 F (37.2 C) 101.4 F (38.6 C)  98.3 F (36.8 C)  TempSrc: Oral Oral  Oral  Resp:      Height:      Weight:      SpO2: 98%   98%     Intake/Output Summary (Last 24 hours) at 09/22/12 1011 Last data filed at 09/22/12 0600  Gross per 24 hour  Intake   1910 ml  Output   1310 ml  Net    600 ml   Filed Weights   09/17/12 1823 09/18/12 0025 09/21/12 0504  Weight: 109.77 kg (242 lb) 111.585 kg (246 lb) 112.5 kg (248 lb 0.3 oz)   FHx; mother; lung cancer/ gastric cancer/Pnacreas Ca          FATHER; BLADDER cA    Exam:   General:  Alert, No Rigors, resting comfortably  Cardiovascular: RR Tachycardic, (-) M/R/G, DP/PT pulse 2+ bilat  Respiratory: CTA Bilat  Abdomen: Soft, NT, ND, (+) BS       Musculoskeletal: (-) Pedal Edema Bilat,    Data Reviewed: Basic Metabolic Panel:  Recent Labs Lab 09/18/12 0550 09/19/12 1040 09/20/12 0957 09/21/12 0455 09/22/12 0350  NA 131* 128* 128* 126* 129*  K 4.5 4.0 4.8 3.5 4.5  CL 99 97 95* 94* 97  CO2 22 21 18* 25 24  GLUCOSE 105* 151* 103* 108* 94  BUN 12 10 13 17 14   CREATININE 1.05 0.88 0.77 0.91 0.95  CALCIUM 8.6 8.5 8.8 8.2* 8.0*  MG  --  2.0 2.0 2.0 2.1   Liver Function Tests:  Recent Labs Lab 09/18/12 0550 09/19/12 1040 09/20/12 0957 09/21/12 0455 09/22/12 0350  AST 79* 75* 72* 46* 50*  ALT 136* 149* 142* 100* 89*  ALKPHOS 96 96 91 77 76  BILITOT 1.0 1.1 1.3* 0.9 1.1  PROT 5.5* 6.0 6.1 5.2* 5.2*  ALBUMIN 2.7* 2.7* 2.6* 2.2* 2.2*   No results found for this basename: LIPASE, AMYLASE,  in the last 168 hours No results found for this basename: AMMONIA,  in the last 168 hours CBC:  Recent Labs Lab 09/17/12 1945 09/18/12 0550 09/20/12 1529 09/21/12 0455 09/22/12 0350  WBC 10.9* 9.3 6.9 8.7 9.3  NEUTROABS 6.7 4.6 3.6 3.4 3.7  HGB 15.7 13.6 14.2 14.0 13.5  HCT 44.2 40.0 41.2 39.8 38.3*  MCV 93.6 94.1 93.2 91.7 93.0  PLT 198 174 204 191 227   Cardiac Enzymes:  Recent Labs Lab 09/18/12 1619  09/19/12 1051 09/19/12 1556 09/19/12 2225 09/20/12 0340 09/20/12 0957 09/20/12 1529  CKTOTAL 91  --  111  --   --   --   --   --   CKMB  1.9  --  3.2  --   --   --   --   --   TROPONINI  --   < > <0.30 <0.30 <0.30 <0.30 <0.30 <0.30  < > = values in this interval not displayed. BNP (last 3 results) No results found for this basename: PROBNP,  in the last 8760 hours CBG: No results found for this basename: GLUCAP,  in the last 168 hours  Recent Results (from the past 240 hour(s))  CULTURE, BLOOD (ROUTINE X 2)     Status: None   Collection Time    09/17/12  7:40 PM      Result  Value Range Status   Specimen Description BLOOD LEFT ANTECUBITAL   Final   Special Requests BOTTLES DRAWN AEROBIC AND ANAEROBIC 10CC   Final   Culture  Setup Time 09/18/2012 02:01   Final   Culture     Final   Value:        BLOOD CULTURE RECEIVED NO GROWTH TO DATE CULTURE WILL BE HELD FOR 5 DAYS BEFORE ISSUING A FINAL NEGATIVE REPORT   Report Status PENDING   Incomplete  CULTURE, BLOOD (ROUTINE X 2)     Status: None   Collection Time    09/17/12  7:47 PM      Result Value Range Status   Specimen Description BLOOD RIGHT ANTECUBITAL   Final   Special Requests BOTTLES DRAWN AEROBIC AND ANAEROBIC 10CC   Final   Culture  Setup Time 09/18/2012 02:01   Final   Culture     Final   Value:        BLOOD CULTURE RECEIVED NO GROWTH TO DATE CULTURE WILL BE HELD FOR 5 DAYS BEFORE ISSUING A FINAL NEGATIVE REPORT   Report Status PENDING   Incomplete  URINE CULTURE     Status: None   Collection Time    09/17/12  7:50 PM      Result Value Range Status   Specimen Description URINE, RANDOM   Final   Special Requests NONE   Final   Culture  Setup Time 09/17/2012 21:06   Final   Colony Count NO GROWTH   Final   Culture NO GROWTH   Final   Report Status 09/18/2012 FINAL   Final  MRSA PCR SCREENING     Status: None   Collection Time    09/18/12  6:01 PM      Result Value Range Status   MRSA by PCR NEGATIVE  NEGATIVE Final   Comment:            The GeneXpert MRSA Assay (FDA     approved for NASAL specimens     only), is one component of a     comprehensive  MRSA colonization     surveillance program. It is not     intended to diagnose MRSA     infection nor to guide or     monitor treatment for     MRSA infections.  CULTURE, BLOOD (ROUTINE X 2)     Status: None   Collection Time    09/19/12 10:40 AM      Result Value Range Status   Specimen Description BLOOD LEFT ARM   Final   Special Requests BOTTLES DRAWN AEROBIC ONLY 6CC   Final   Culture  Setup Time 09/19/2012 20:32   Final   Culture     Final   Value:        BLOOD CULTURE RECEIVED NO GROWTH TO DATE CULTURE WILL BE HELD FOR 5 DAYS BEFORE ISSUING A FINAL NEGATIVE REPORT   Report Status PENDING   Incomplete  CULTURE, BLOOD (ROUTINE X 2)     Status: None   Collection Time    09/19/12 10:45 AM      Result Value Range Status   Specimen Description BLOOD RIGHT ARM   Final   Special Requests BOTTLES DRAWN AEROBIC ONLY 10CC   Final   Culture  Setup Time 09/19/2012 20:32   Final   Culture     Final   Value:        BLOOD CULTURE RECEIVED NO GROWTH TO DATE  CULTURE WILL BE HELD FOR 5 DAYS BEFORE ISSUING A FINAL NEGATIVE REPORT   Report Status PENDING   Incomplete     Studies: No results found.  Scheduled Meds: . amLODipine  5 mg Oral QPM  . aspirin  81 mg Oral QPM  . bacitracin   Topical Daily  . docusate sodium  100 mg Oral BID  . folic acid  1 mg Oral Daily  . labetalol  100 mg Oral BID  . lisinopril  40 mg Oral Daily  . omega-3 acid ethyl esters  1 g Oral BID  . pantoprazole  40 mg Oral Daily  . predniSONE  5 mg Oral Q breakfast  . sodium chloride  3 mL Intravenous Q12H   Continuous Infusions: . sodium chloride 75 mL/hr at 09/20/12 1900    Principal Problem:   Fever Active Problems:   Polymyalgia rheumatica   CAD (coronary artery disease)   Hyperlipidemia   HTN (hypertension)   Atrial flutter   Elevated liver enzymes    Time spent: 30 min    WOODS, CURTIS, J  [facility] Pager [phone]. If 7PM-7AM, please contact night-coverage at [email or web address],  password [facility] 09/22/2012, 10:11 AM  LOS: 5 days

## 2012-09-22 NOTE — Progress Notes (Signed)
Patient ID: ANASTASIO WOGAN, male   DOB: 1951-02-23, 62 y.o.   MRN: [id number]         [facility]    Date of Admission:  09/17/2012     Principal Problem:   Fever Active Problems:   Polymyalgia rheumatica   CAD (coronary artery disease)   Hyperlipidemia   HTN (hypertension)   Atrial flutter   Elevated liver enzymes   . amLODipine  5 mg Oral QPM  . aspirin  81 mg Oral QPM  . bacitracin   Topical Daily  . docusate sodium  100 mg Oral BID  . folic acid  1 mg Oral Daily  . labetalol  100 mg Oral BID  . lisinopril  40 mg Oral Daily  . omega-3 acid ethyl esters  1 g Oral BID  . pantoprazole  40 mg Oral Daily  . predniSONE  5 mg Oral Q breakfast  . sodium chloride  3 mL Intravenous Q12H    Subjective: He is still feeling fairly miserable with fever and chills.  Past Medical History  Diagnosis Date  . Coronary disease   . Hypercholesterolemia   . Polymyalgia rheumatica   . Bronchitis   . Insomnia   . Tobacco abuse   . History of chest pain   . Hypertension   . Coronary artery disease   . Myocardial infarction   . Shortness of breath   . Pneumonia   . Headache(784.0)   . Atrial flutter 09/19/2012    History  Substance Use Topics  . Smoking status: Current Some Day Smoker    Types: Cigars  . Smokeless tobacco: Not on file  . Alcohol Use: 0.6 oz/week    1 Cans of beer per week    Family History  Problem Relation Age of Onset  . Pancreatic cancer Mother   . Bladder Cancer Father     Allergies  Allergen Reactions  . Oxycodone Itching  . Iodine Itching  . Zocor (Simvastatin) Other (See Comments)    Pain    Objective: Temp:  [98 F (36.7 C)-101.4 F (38.6 C)] 100.9 F (38.3 C) (07/22 1413) Pulse Rate:  [71-101] 101 (07/22 1420) Resp:  [18] 18 (07/22 1132) BP: (84-128)/(47-90) 128/56 mmHg (07/22 1456) SpO2:  [95 %-98 %] 97 % (07/22 1132)  General: He is flushed but alert and fully conversant Skin: No specific rash or  palpable adenopathy Lungs: Clear Cor: Regular S1 and S2 no murmurs Abdomen: Obese, soft and nontender. I do not feel a liver, spleen or other masses   Lab Results Lab Results  Component Value Date   WBC 9.3 09/22/2012   HGB 13.5 09/22/2012   HCT 38.3* 09/22/2012   MCV 93.0 09/22/2012   PLT 227 09/22/2012    Lab Results  Component Value Date   CREATININE 0.95 09/22/2012   BUN 14 09/22/2012   NA 129* 09/22/2012   K 4.5 09/22/2012   CL 97 09/22/2012   CO2 24 09/22/2012    Lab Results  Component Value Date   ALT 89* 09/22/2012   AST 50* 09/22/2012   ALKPHOS 76 09/22/2012   BILITOT 1.1 09/22/2012    CMV IgM: Greater than 240  Microbiology: Recent Results (from the past 240 hour(s))  CULTURE, BLOOD (ROUTINE X 2)     Status: None   Collection Time    09/17/12  7:40 PM      Result Value Range Status   Specimen Description BLOOD LEFT ANTECUBITAL  Final   Special Requests BOTTLES DRAWN AEROBIC AND ANAEROBIC 10CC   Final   Culture  Setup Time 09/18/2012 02:01   Final   Culture     Final   Value:        BLOOD CULTURE RECEIVED NO GROWTH TO DATE CULTURE WILL BE HELD FOR 5 DAYS BEFORE ISSUING A FINAL NEGATIVE REPORT   Report Status PENDING   Incomplete  CULTURE, BLOOD (ROUTINE X 2)     Status: None   Collection Time    09/17/12  7:47 PM      Result Value Range Status   Specimen Description BLOOD RIGHT ANTECUBITAL   Final   Special Requests BOTTLES DRAWN AEROBIC AND ANAEROBIC 10CC   Final   Culture  Setup Time 09/18/2012 02:01   Final   Culture     Final   Value:        BLOOD CULTURE RECEIVED NO GROWTH TO DATE CULTURE WILL BE HELD FOR 5 DAYS BEFORE ISSUING A FINAL NEGATIVE REPORT   Report Status PENDING   Incomplete  URINE CULTURE     Status: None   Collection Time    09/17/12  7:50 PM      Result Value Range Status   Specimen Description URINE, RANDOM   Final   Special Requests NONE   Final   Culture  Setup Time 09/17/2012 21:06   Final   Colony Count NO GROWTH   Final   Culture NO  GROWTH   Final   Report Status 09/18/2012 FINAL   Final  MRSA PCR SCREENING     Status: None   Collection Time    09/18/12  6:01 PM      Result Value Range Status   MRSA by PCR NEGATIVE  NEGATIVE Final   Comment:            The GeneXpert MRSA Assay (FDA     approved for NASAL specimens     only), is one component of a     comprehensive MRSA colonization     surveillance program. It is not     intended to diagnose MRSA     infection nor to guide or     monitor treatment for     MRSA infections.  CULTURE, BLOOD (ROUTINE X 2)     Status: None   Collection Time    09/19/12 10:40 AM      Result Value Range Status   Specimen Description BLOOD LEFT ARM   Final   Special Requests BOTTLES DRAWN AEROBIC ONLY 6CC   Final   Culture  Setup Time 09/19/2012 20:32   Final   Culture     Final   Value:        BLOOD CULTURE RECEIVED NO GROWTH TO DATE CULTURE WILL BE HELD FOR 5 DAYS BEFORE ISSUING A FINAL NEGATIVE REPORT   Report Status PENDING   Incomplete  CULTURE, BLOOD (ROUTINE X 2)     Status: None   Collection Time    09/19/12 10:45 AM      Result Value Range Status   Specimen Description BLOOD RIGHT ARM   Final   Special Requests BOTTLES DRAWN AEROBIC ONLY 10CC   Final   Culture  Setup Time 09/19/2012 20:32   Final   Culture     Final   Value:        BLOOD CULTURE RECEIVED NO GROWTH TO DATE CULTURE WILL BE HELD FOR 5 DAYS BEFORE ISSUING A FINAL NEGATIVE  REPORT   Report Status PENDING   Incomplete    Studies/Results: No results found.  Assessment: He appears to have acute CMV mononucleosis syndrome with protracted fevers. This is likely to resolve spontaneously within the next one to 2 weeks. Although he has been on methotrexate and prednisone he is not likely to have severe complications seen and more immunocompromised populations. There is no evidence that treatment with antiviral medications we'll alter the course of this illness and I do not favor starting valganciclovir or other  treatments at this time. He'll continue to focus on trying to control his fever and became more comfortable as well as controlling his tachycardia.  Plan: 1. Continue symptomatic therapy  Cliffton Asters, MD [facility] [phone] pager   [phone] cell 09/22/2012, 3:45 PM

## 2012-09-23 LAB — OSMOLALITY: Osmolality: 274 mOsm/kg — ABNORMAL LOW (ref 275–300)

## 2012-09-23 LAB — CBC WITH DIFFERENTIAL/PLATELET
Basophils Relative: 5 % — ABNORMAL HIGH (ref 0–1)
Eosinophils Absolute: 0.1 10*3/uL (ref 0.0–0.7)
Eosinophils Relative: 1 % (ref 0–5)
Hemoglobin: 12.7 g/dL — ABNORMAL LOW (ref 13.0–17.0)
Lymphs Abs: 7.2 10*3/uL — ABNORMAL HIGH (ref 0.7–4.0)
MCH: 32.9 pg (ref 26.0–34.0)
MCHC: 35.2 g/dL (ref 30.0–36.0)
MCV: 93.5 fL (ref 78.0–100.0)
Monocytes Absolute: 1.6 10*3/uL — ABNORMAL HIGH (ref 0.1–1.0)
RBC: 3.86 MIL/uL — ABNORMAL LOW (ref 4.22–5.81)

## 2012-09-23 LAB — COMPREHENSIVE METABOLIC PANEL
ALT: 91 U/L — ABNORMAL HIGH (ref 0–53)
AST: 61 U/L — ABNORMAL HIGH (ref 0–37)
Alkaline Phosphatase: 76 U/L (ref 39–117)
CO2: 23 mEq/L (ref 19–32)
Calcium: 7.8 mg/dL — ABNORMAL LOW (ref 8.4–10.5)
GFR calc non Af Amer: 89 mL/min — ABNORMAL LOW (ref >90)
Glucose, Bld: 93 mg/dL (ref 70–99)
Potassium: 4 mEq/L (ref 3.5–5.1)
Sodium: 132 mEq/L — ABNORMAL LOW (ref 135–145)
Total Protein: 5 g/dL — ABNORMAL LOW (ref 6.0–8.3)

## 2012-09-23 MED ORDER — ENOXAPARIN SODIUM 40 MG/0.4ML ~~LOC~~ SOLN
40.0000 mg | SUBCUTANEOUS | Status: DC
  Administered 2012-09-23: 40 mg via SUBCUTANEOUS
  Filled 2012-09-23 (×2): qty 0.4

## 2012-09-23 NOTE — Progress Notes (Signed)
[facility] Progress Note [place] TEAM 1 - Stepdown/ICU ARCHIMEDES HAROLD WUJ:[id number] DOB: 04-02-1950 DOA: 09/17/2012 PCP: Vivien Presto, MD  Brief narrative: 62 y.o. Male w/ PMHx polymyalgia rheumatica, CAD status post CABG, hyperlipidemia and hypertension who had been experiencing fever and chills over one week. Patient had come to the ER 2 days after his symptoms had started and was placed on doxycycline. Patient was on doxycycline for 2 days after which his fever subsided and he travelled to [place]. There he started spiking fevers again and he had gone to an ER and was placed on Bactrim empirically. As per the family workup done over there was negative for any source. Despite taking Bactrim for 2-3 days patient's fever persisted and therefore he came back to the ER 7/17.   Prior to this episode the patient had been having some joint aches and his rheumatologist was planning to increase his prednisone from 5 mg to 10 which was not done because patient started developing the fever.   In the ER the patient's LFTs were found to be mildly elevated. He denied any insect bites. However patient does camp and fish regularly.  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 - care as suggested by ID, mostly simply sx control and tincture of time   Polymyalgia rheumatica No evidence of acute flair at this time - holding MTX for now   CAD s/p CABG Followed by Corinda Gubler - no chest pain at this time   Hyperlipidemia Resume home Lipitor before d/c home   HTN  Under control on current medication/bordrline low due to hypovolemia - hydrate and follow   Hyponatremia Most c/w volume depletion (likely insensitive loss due to hectic fevers) - cont to hydrate and follow - increase IVF rate today   Atrial flutter New since this admission - likely brought about by hypovolemia and acute illness - CHADS2 VASC score only 1 at this time - will ask his  Cardiolgist to see him in consult on 7/24  Elevated liver enzymes - hepatic steatosis (on CT) resolving  Code Status: FULL Family Communication: spoke w/ pt and multiple family members at bedside  Disposition Plan: SDU   Consultants: ID  Procedures: none  Antibiotics: none  DVT prophylaxis: lovenox  HPI/Subjective: Pt has no complaints at this time.  He denies sob, n/v, cp, or current fever.  His appetite is slowly returning.  Objective: Blood pressure 110/51, pulse 76, temperature 101.4 F (38.6 C), temperature source Oral, resp. rate 20, height 5\' 11"  (1.803 m), weight 111.4 kg (245 lb 9.5 oz), SpO2 97.00%.  Intake/Output Summary (Last 24 hours) at 09/23/12 1543 Last data filed at 09/23/12 1100  Gross per 24 hour  Intake   2742 ml  Output   1720 ml  Net   1022 ml   Exam: General: No acute respiratory distress Lungs: Clear to auscultation bilaterally without wheezes or crackles Cardiovascular: Regular rate and rhythm without murmur gallop or rub Abdomen: overweight, nontender, soft, bowel sounds positive, no rebound, no ascites, no appreciable mass Extremities: No significant cyanosis, clubbing, or edema bilateral lower extremities  Data Reviewed: Basic Metabolic Panel:  Recent Labs Lab 09/19/12 1040 09/20/12 0957 09/21/12 0455 09/22/12 0350 09/23/12 0403  NA 128* 128* 126* 129* 132*  K 4.0 4.8 3.5 4.5 4.0  CL 97 95* 94* 97 100  CO2 21 18* 25 24 23   GLUCOSE 151* 103* 108* 94 93  BUN 10 13 17 14 11   CREATININE  0.88 0.77 0.91 0.95 0.89  CALCIUM 8.5 8.8 8.2* 8.0* 7.8*  MG 2.0 2.0 2.0 2.1 2.0   Liver Function Tests:  Recent Labs Lab 09/19/12 1040 09/20/12 0957 09/21/12 0455 09/22/12 0350 09/23/12 0403  AST 75* 72* 46* 50* 61*  ALT 149* 142* 100* 89* 91*  ALKPHOS 96 91 77 76 76  BILITOT 1.1 1.3* 0.9 1.1 0.8  PROT 6.0 6.1 5.2* 5.2* 5.0*  ALBUMIN 2.7* 2.6* 2.2* 2.2* 2.0*   CBC:  Recent Labs Lab 09/18/12 0550 09/20/12 1529 09/21/12 0455  09/22/12 0350 09/23/12 0403  WBC 9.3 6.9 8.7 9.3 12.2*  NEUTROABS 4.6 3.6 3.4 3.7 2.7  HGB 13.6 14.2 14.0 13.5 12.7*  HCT 40.0 41.2 39.8 38.3* 36.1*  MCV 94.1 93.2 91.7 93.0 93.5  PLT 174 204 191 227 248   Cardiac Enzymes:  Recent Labs Lab 09/18/12 1619  09/19/12 1051 09/19/12 1556 09/19/12 2225 09/20/12 0340 09/20/12 0957 09/20/12 1529  CKTOTAL 91  --  111  --   --   --   --   --   CKMB 1.9  --  3.2  --   --   --   --   --   TROPONINI  --   < > <0.30 <0.30 <0.30 <0.30 <0.30 <0.30  < > = values in this interval not displayed.   Recent Results (from the past 240 hour(s))  CULTURE, BLOOD (ROUTINE X 2)     Status: None   Collection Time    09/17/12  7:40 PM      Result Value Range Status   Specimen Description BLOOD LEFT ANTECUBITAL   Final   Special Requests BOTTLES DRAWN AEROBIC AND ANAEROBIC 10CC   Final   Culture  Setup Time 09/18/2012 02:01   Final   Culture     Final   Value:        BLOOD CULTURE RECEIVED NO GROWTH TO DATE CULTURE WILL BE HELD FOR 5 DAYS BEFORE ISSUING A FINAL NEGATIVE REPORT   Report Status PENDING   Incomplete  CULTURE, BLOOD (ROUTINE X 2)     Status: None   Collection Time    09/17/12  7:47 PM      Result Value Range Status   Specimen Description BLOOD RIGHT ANTECUBITAL   Final   Special Requests BOTTLES DRAWN AEROBIC AND ANAEROBIC 10CC   Final   Culture  Setup Time 09/18/2012 02:01   Final   Culture     Final   Value:        BLOOD CULTURE RECEIVED NO GROWTH TO DATE CULTURE WILL BE HELD FOR 5 DAYS BEFORE ISSUING A FINAL NEGATIVE REPORT   Report Status PENDING   Incomplete  URINE CULTURE     Status: None   Collection Time    09/17/12  7:50 PM      Result Value Range Status   Specimen Description URINE, RANDOM   Final   Special Requests NONE   Final   Culture  Setup Time 09/17/2012 21:06   Final   Colony Count NO GROWTH   Final   Culture NO GROWTH   Final   Report Status 09/18/2012 FINAL   Final  MRSA PCR SCREENING     Status: None    Collection Time    09/18/12  6:01 PM      Result Value Range Status   MRSA by PCR NEGATIVE  NEGATIVE Final   Comment:  The GeneXpert MRSA Assay (FDA     approved for NASAL specimens     only), is one component of a     comprehensive MRSA colonization     surveillance program. It is not     intended to diagnose MRSA     infection nor to guide or     monitor treatment for     MRSA infections.  CULTURE, BLOOD (ROUTINE X 2)     Status: None   Collection Time    09/19/12 10:40 AM      Result Value Range Status   Specimen Description BLOOD LEFT ARM   Final   Special Requests BOTTLES DRAWN AEROBIC ONLY 6CC   Final   Culture  Setup Time 09/19/2012 20:32   Final   Culture     Final   Value:        BLOOD CULTURE RECEIVED NO GROWTH TO DATE CULTURE WILL BE HELD FOR 5 DAYS BEFORE ISSUING A FINAL NEGATIVE REPORT   Report Status PENDING   Incomplete  CULTURE, BLOOD (ROUTINE X 2)     Status: None   Collection Time    09/19/12 10:45 AM      Result Value Range Status   Specimen Description BLOOD RIGHT ARM   Final   Special Requests BOTTLES DRAWN AEROBIC ONLY 10CC   Final   Culture  Setup Time 09/19/2012 20:32   Final   Culture     Final   Value:        BLOOD CULTURE RECEIVED NO GROWTH TO DATE CULTURE WILL BE HELD FOR 5 DAYS BEFORE ISSUING A FINAL NEGATIVE REPORT   Report Status PENDING   Incomplete  RESPIRATORY VIRUS PANEL     Status: None   Collection Time    09/20/12 10:39 PM      Result Value Range Status   Source - RVPAN NASAL SWAB   Corrected   Comment: CORRECTED ON 07/22 AT 2007: PREVIOUSLY REPORTED AS NASAL SWAB   Respiratory Syncytial Virus A NOT DETECTED   Final   Respiratory Syncytial Virus B NOT DETECTED   Final   Influenza A NOT DETECTED   Final   Influenza B NOT DETECTED   Final   Parainfluenza 1 NOT DETECTED   Final   Parainfluenza 2 NOT DETECTED   Final   Parainfluenza 3 NOT DETECTED   Final   Metapneumovirus NOT DETECTED   Final   Rhinovirus NOT DETECTED    Final   Adenovirus NOT DETECTED   Final   Influenza A H1 NOT DETECTED   Final   Influenza A H3 NOT DETECTED   Final   Comment: (NOTE)           Normal Reference Range for each Analyte: NOT DETECTED     Testing performed using the Luminex xTAG Respiratory Viral Panel test     kit.     This test was developed and its performance characteristics determined     by [employer]. It has not been cleared or approved by the [place]     Food and Drug Administration. This test is used for clinical purposes.     It should not be regarded as investigational or for research. This     laboratory is certified under the Clinical Laboratory Improvement     Amendments of 1988 (CLIA) as qualified to perform high complexity     clinical laboratory testing.     Studies:  Recent x-ray studies have been reviewed  in detail by the Attending Physician  Scheduled Meds:  Scheduled Meds: . amLODipine  5 mg Oral QPM  . aspirin  81 mg Oral QPM  . bacitracin   Topical Daily  . docusate sodium  100 mg Oral BID  . folic acid  1 mg Oral Daily  . labetalol  100 mg Oral BID  . lisinopril  40 mg Oral Daily  . omega-3 acid ethyl esters  1 g Oral BID  . pantoprazole  40 mg Oral Daily  . predniSONE  5 mg Oral Q breakfast  . sodium chloride  3 mL Intravenous Q12H    Time spent on care of this patient:   [facility] T  [facility] Office  [phone] Pager - Text Page per Loretha Stapler as per below:  On-Call/Text Page:      [email or web address]      password [email or web address]  If 7PM-7AM, please contact night-coverage [email or web address] Password [facility] 09/23/2012, 3:43 PM   LOS: 6 days

## 2012-09-23 NOTE — Progress Notes (Signed)
Patient heart rate increased to 140's while resting after a shivering episode. Two EKG's completed and MD notified. EKG noted SVT and ST but patient rhythm has been A flutter most of the day.  Patient is asymptomatic and has had these rises in his heart rate but not for a sustained period. Awaiting the call from the night MD. Report given to the night nurse.  Hisao Doo, Charlaine Dalton RN

## 2012-09-23 NOTE — Progress Notes (Signed)
Patient ID: William [name], male   DOB: Apr 02, 1950, 62 y.o.   MRN: [id number]         [facility]    Date of Admission:  09/17/2012     Principal Problem:   Cytomegalovirus mononucleosis Active Problems:   Polymyalgia rheumatica   CAD (coronary artery disease)   Hyperlipidemia   HTN (hypertension)   Atrial flutter   Elevated liver enzymes   . amLODipine  5 mg Oral QPM  . aspirin  81 mg Oral QPM  . bacitracin   Topical Daily  . docusate sodium  100 mg Oral BID  . folic acid  1 mg Oral Daily  . labetalol  100 mg Oral BID  . lisinopril  40 mg Oral Daily  . omega-3 acid ethyl esters  1 g Oral BID  . pantoprazole  40 mg Oral Daily  . predniSONE  5 mg Oral Q breakfast  . sodium chloride  3 mL Intravenous Q12H    Subjective: He is still having fever, chills and sweats but they do not seem to be coming as frequently and are not as severe. His appetite is improving.  Objective: Temp:  [98 F (36.7 C)-101.4 F (38.6 C)] 101.4 F (38.6 C) (07/23 1100) Pulse Rate:  [74-89] 76 (07/23 1222) Resp:  [16-20] 20 (07/23 0313) BP: (80-122)/(43-70) 110/51 mmHg (07/23 1222) SpO2:  [97 %-98 %] 97 % (07/23 1222) Weight:  [111.4 kg (245 lb 9.5 oz)] 111.4 kg (245 lb 9.5 oz) (07/23 0614)  General: He is sitting up on the bedside receiving a sponge bath Skin: No rash Lungs: Clear Cor: Irregular tachycardia Abdomen: Obese, soft and nontender   Lab Results Lab Results  Component Value Date   WBC 12.2* 09/23/2012   HGB 12.7* 09/23/2012   HCT 36.1* 09/23/2012   MCV 93.5 09/23/2012   PLT 248 09/23/2012    Lab Results  Component Value Date   CREATININE 0.89 09/23/2012   BUN 11 09/23/2012   NA 132* 09/23/2012   K 4.0 09/23/2012   CL 100 09/23/2012   CO2 23 09/23/2012    Lab Results  Component Value Date   ALT 91* 09/23/2012   AST 61* 09/23/2012   ALKPHOS 76 09/23/2012   BILITOT 0.8 09/23/2012      Microbiology: Recent Results (from the past 240 hour(s))    CULTURE, BLOOD (ROUTINE X 2)     Status: None   Collection Time    09/17/12  7:40 PM      Result Value Range Status   Specimen Description BLOOD LEFT ANTECUBITAL   Final   Special Requests BOTTLES DRAWN AEROBIC AND ANAEROBIC 10CC   Final   Culture  Setup Time 09/18/2012 02:01   Final   Culture     Final   Value:        BLOOD CULTURE RECEIVED NO GROWTH TO DATE CULTURE WILL BE HELD FOR 5 DAYS BEFORE ISSUING A FINAL NEGATIVE REPORT   Report Status PENDING   Incomplete  CULTURE, BLOOD (ROUTINE X 2)     Status: None   Collection Time    09/17/12  7:47 PM      Result Value Range Status   Specimen Description BLOOD RIGHT ANTECUBITAL   Final   Special Requests BOTTLES DRAWN AEROBIC AND ANAEROBIC 10CC   Final   Culture  Setup Time 09/18/2012 02:01   Final   Culture     Final   Value:  BLOOD CULTURE RECEIVED NO GROWTH TO DATE CULTURE WILL BE HELD FOR 5 DAYS BEFORE ISSUING A FINAL NEGATIVE REPORT   Report Status PENDING   Incomplete  URINE CULTURE     Status: None   Collection Time    09/17/12  7:50 PM      Result Value Range Status   Specimen Description URINE, RANDOM   Final   Special Requests NONE   Final   Culture  Setup Time 09/17/2012 21:06   Final   Colony Count NO GROWTH   Final   Culture NO GROWTH   Final   Report Status 09/18/2012 FINAL   Final  MRSA PCR SCREENING     Status: None   Collection Time    09/18/12  6:01 PM      Result Value Range Status   MRSA by PCR NEGATIVE  NEGATIVE Final   Comment:            The GeneXpert MRSA Assay (FDA     approved for NASAL specimens     only), is one component of a     comprehensive MRSA colonization     surveillance program. It is not     intended to diagnose MRSA     infection nor to guide or     monitor treatment for     MRSA infections.  CULTURE, BLOOD (ROUTINE X 2)     Status: None   Collection Time    09/19/12 10:40 AM      Result Value Range Status   Specimen Description BLOOD LEFT ARM   Final   Special Requests  BOTTLES DRAWN AEROBIC ONLY 6CC   Final   Culture  Setup Time 09/19/2012 20:32   Final   Culture     Final   Value:        BLOOD CULTURE RECEIVED NO GROWTH TO DATE CULTURE WILL BE HELD FOR 5 DAYS BEFORE ISSUING A FINAL NEGATIVE REPORT   Report Status PENDING   Incomplete  CULTURE, BLOOD (ROUTINE X 2)     Status: None   Collection Time    09/19/12 10:45 AM      Result Value Range Status   Specimen Description BLOOD RIGHT ARM   Final   Special Requests BOTTLES DRAWN AEROBIC ONLY 10CC   Final   Culture  Setup Time 09/19/2012 20:32   Final   Culture     Final   Value:        BLOOD CULTURE RECEIVED NO GROWTH TO DATE CULTURE WILL BE HELD FOR 5 DAYS BEFORE ISSUING A FINAL NEGATIVE REPORT   Report Status PENDING   Incomplete  RESPIRATORY VIRUS PANEL     Status: None   Collection Time    09/20/12 10:39 PM      Result Value Range Status   Source - RVPAN NASAL SWAB   Corrected   Comment: CORRECTED ON 07/22 AT 2007: PREVIOUSLY REPORTED AS NASAL SWAB   Respiratory Syncytial Virus A NOT DETECTED   Final   Respiratory Syncytial Virus B NOT DETECTED   Final   Influenza A NOT DETECTED   Final   Influenza B NOT DETECTED   Final   Parainfluenza 1 NOT DETECTED   Final   Parainfluenza 2 NOT DETECTED   Final   Parainfluenza 3 NOT DETECTED   Final   Metapneumovirus NOT DETECTED   Final   Rhinovirus NOT DETECTED   Final   Adenovirus NOT DETECTED   Final   Influenza  A H1 NOT DETECTED   Final   Influenza A H3 NOT DETECTED   Final   Comment: (NOTE)           Normal Reference Range for each Analyte: NOT DETECTED     Testing performed using the Luminex xTAG Respiratory Viral Panel test     kit.     This test was developed and its performance characteristics determined     by [employer]. It has not been cleared or approved by the [place]     Food and Drug Administration. This test is used for clinical purposes.     It should not be regarded as investigational or for research. This     laboratory is  certified under the Clinical Laboratory Improvement     Amendments of 1988 (CLIA) as qualified to perform high complexity     clinical laboratory testing.    Assessment: He is improving slowly and spontaneously as his acute CMV mononucleosis syndrome resolves.  Plan: 1. Continue symptomatic management 2. Hold methotrexate until he is over the worst of his illness  Cliffton Asters, MD [facility] [phone] pager   [phone] cell 09/23/2012, 4:47 PM

## 2012-09-24 ENCOUNTER — Encounter (HOSPITAL_COMMUNITY): Payer: Self-pay | Admitting: Cardiology

## 2012-09-24 DIAGNOSIS — B2790 Infectious mononucleosis, unspecified without complication: Secondary | ICD-10-CM

## 2012-09-24 LAB — CULTURE, BLOOD (ROUTINE X 2): Culture: NO GROWTH

## 2012-09-24 LAB — CBC WITH DIFFERENTIAL/PLATELET
Basophils Absolute: 0.5 10*3/uL — ABNORMAL HIGH (ref 0.0–0.1)
Lymphs Abs: 7.8 10*3/uL — ABNORMAL HIGH (ref 0.7–4.0)
MCV: 93.5 fL (ref 78.0–100.0)
Monocytes Relative: 11 % (ref 3–12)
Neutrophils Relative %: 22 % — ABNORMAL LOW (ref 43–77)
Platelets: 260 10*3/uL (ref 150–400)
RDW: 14 % (ref 11.5–15.5)
WBC: 12.6 10*3/uL — ABNORMAL HIGH (ref 4.0–10.5)

## 2012-09-24 LAB — COMPREHENSIVE METABOLIC PANEL
BUN: 12 mg/dL (ref 6–23)
CO2: 20 mEq/L (ref 19–32)
Chloride: 101 mEq/L (ref 96–112)
Creatinine, Ser: 0.92 mg/dL (ref 0.50–1.35)
GFR calc Af Amer: >90 mL/min (ref >90)
GFR calc non Af Amer: 88 mL/min — ABNORMAL LOW (ref >90)
Glucose, Bld: 127 mg/dL — ABNORMAL HIGH (ref 70–99)
Total Bilirubin: 0.7 mg/dL (ref 0.3–1.2)

## 2012-09-24 LAB — MAGNESIUM: Magnesium: 2 mg/dL (ref 1.5–2.5)

## 2012-09-24 MED ORDER — APIXABAN 5 MG PO TABS
5.0000 mg | ORAL_TABLET | Freq: Two times a day (BID) | ORAL | Status: DC
  Administered 2012-09-24 – 2012-09-29 (×10): 5 mg via ORAL
  Filled 2012-09-24 (×11): qty 1

## 2012-09-24 MED ORDER — METOPROLOL TARTRATE 25 MG PO TABS
25.0000 mg | ORAL_TABLET | Freq: Two times a day (BID) | ORAL | Status: DC
  Administered 2012-09-24: 25 mg via ORAL
  Filled 2012-09-24 (×3): qty 1

## 2012-09-24 NOTE — Progress Notes (Signed)
[facility] Progress Note [place] TEAM 1 - Stepdown/ICU JADON HARBAUGH ZOX:[id number] DOB: December 12, 1950 DOA: 09/17/2012 PCP: Vivien Presto, MD  Brief narrative: 62 y.o. Male w/ PMHx polymyalgia rheumatica, CAD status post CABG, hyperlipidemia and hypertension who had been experiencing fever and chills over one week. Patient had come to the ER 2 days after his symptoms had started and was placed on doxycycline. Patient was on doxycycline for 2 days after which his fever subsided and he travelled to [place]. There he started spiking fevers again and he had gone to an ER and was placed on Bactrim empirically. As per the family workup done over there was negative for any source. Despite taking Bactrim for 2-3 days patient's fever persisted and therefore he came back to the ER 7/17.   Prior to this episode the patient had been having some joint aches and his rheumatologist was planning to increase his prednisone from 5 mg to 10 which was not done because patient started developing the fever.   In the ER the patient's LFTs were found to be mildly elevated. He denied any insect bites. However patient does camp and fish regularly.  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 - care as suggested by ID-symptomatic management  Polymyalgia rheumatica No evidence of acute flair at this time - holding MTX for now   CAD s/p CABG Followed by Corinda Gubler - no chest pain at this time   Hyperlipidemia Resume home Lipitor before d/c home   HTN  Under control on current medication/bordrline low due to hypovolemia - hydrate and follow   Hyponatremia Most c/w volume depletion (likely insensitive loss due to hectic fevers) - cont to hydrate and follow - quite well hydrated today after increasing the fluids yesterday  Atrial flutter New since this admission - likely brought about by hypovolemia and acute illness - CHADS2 VASC score only 1 at this time -  last [place] cardiology to see him  Elevated liver enzymes - hepatic steatosis (on CT) resolving  Code Status: FULL Family Communication: spoke w/ pt and wife Disposition Plan: SDU   Consultants: ID  Procedures: none  Antibiotics: none  DVT prophylaxis: lovenox  HPI/Subjective: Pt continues to have fevers and chills. Otherwise is improving.  Objective: Blood pressure 98/83, pulse 111, temperature 98.4 F (36.9 C), temperature source Oral, resp. rate 20, height 5\' 11"  (1.803 m), weight 116.4 kg (256 lb 9.9 oz), SpO2 96.00%.  Intake/Output Summary (Last 24 hours) at 09/24/12 1548 Last data filed at 09/24/12 1500  Gross per 24 hour  Intake   3310 ml  Output      0 ml  Net   3310 ml   Exam: General: No acute respiratory distress Lungs: Clear to auscultation bilaterally without wheezes or crackles Cardiovascular: Regular rate and rhythm without murmur gallop or rub Abdomen: overweight, nontender, soft, bowel sounds positive, no rebound, no ascites, no appreciable mass Extremities: No significant cyanosis, clubbing, or edema bilateral lower extremities  Data Reviewed: Basic Metabolic Panel:  Recent Labs Lab 09/20/12 0957 09/21/12 0455 09/22/12 0350 09/23/12 0403 09/24/12 0410  NA 128* 126* 129* 132* 132*  K 4.8 3.5 4.5 4.0 4.2  CL 95* 94* 97 100 101  CO2 18* 25 24 23 20   GLUCOSE 103* 108* 94 93 127*  BUN 13 17 14 11 12   CREATININE 0.77 0.91 0.95 0.89 0.92  CALCIUM 8.8 8.2* 8.0* 7.8* 7.5*  MG 2.0 2.0 2.1 2.0 2.0   Liver Function  Tests:  Recent Labs Lab 09/20/12 0957 09/21/12 0455 09/22/12 0350 09/23/12 0403 09/24/12 0410  AST 72* 46* 50* 61* 68*  ALT 142* 100* 89* 91* 99*  ALKPHOS 91 77 76 76 103  BILITOT 1.3* 0.9 1.1 0.8 0.7  PROT 6.1 5.2* 5.2* 5.0* 4.7*  ALBUMIN 2.6* 2.2* 2.2* 2.0* 1.9*   CBC:  Recent Labs Lab 09/20/12 1529 09/21/12 0455 09/22/12 0350 09/23/12 0403 09/24/12 0410  WBC 6.9 8.7 9.3 12.2* 12.6*  NEUTROABS 3.6 3.4 3.7 2.7  2.8  HGB 14.2 14.0 13.5 12.7* 12.6*  HCT 41.2 39.8 38.3* 36.1* 36.2*  MCV 93.2 91.7 93.0 93.5 93.5  PLT 204 191 227 248 260   Cardiac Enzymes:  Recent Labs Lab 09/18/12 1619  09/19/12 1051 09/19/12 1556 09/19/12 2225 09/20/12 0340 09/20/12 0957 09/20/12 1529  CKTOTAL 91  --  111  --   --   --   --   --   CKMB 1.9  --  3.2  --   --   --   --   --   TROPONINI  --   < > <0.30 <0.30 <0.30 <0.30 <0.30 <0.30  < > = values in this interval not displayed.   Recent Results (from the past 240 hour(s))  CULTURE, BLOOD (ROUTINE X 2)     Status: None   Collection Time    09/17/12  7:40 PM      Result Value Range Status   Specimen Description BLOOD LEFT ANTECUBITAL   Final   Special Requests BOTTLES DRAWN AEROBIC AND ANAEROBIC 10CC   Final   Culture  Setup Time 09/18/2012 02:01   Final   Culture NO GROWTH 5 DAYS   Final   Report Status 09/24/2012 FINAL   Final  CULTURE, BLOOD (ROUTINE X 2)     Status: None   Collection Time    09/17/12  7:47 PM      Result Value Range Status   Specimen Description BLOOD RIGHT ANTECUBITAL   Final   Special Requests BOTTLES DRAWN AEROBIC AND ANAEROBIC 10CC   Final   Culture  Setup Time 09/18/2012 02:01   Final   Culture NO GROWTH 5 DAYS   Final   Report Status 09/24/2012 FINAL   Final  URINE CULTURE     Status: None   Collection Time    09/17/12  7:50 PM      Result Value Range Status   Specimen Description URINE, RANDOM   Final   Special Requests NONE   Final   Culture  Setup Time 09/17/2012 21:06   Final   Colony Count NO GROWTH   Final   Culture NO GROWTH   Final   Report Status 09/18/2012 FINAL   Final  MRSA PCR SCREENING     Status: None   Collection Time    09/18/12  6:01 PM      Result Value Range Status   MRSA by PCR NEGATIVE  NEGATIVE Final   Comment:            The GeneXpert MRSA Assay (FDA     approved for NASAL specimens     only), is one component of a     comprehensive MRSA colonization     surveillance program. It is not      intended to diagnose MRSA     infection nor to guide or     monitor treatment for     MRSA infections.  CULTURE, BLOOD (ROUTINE X 2)  Status: None   Collection Time    09/19/12 10:40 AM      Result Value Range Status   Specimen Description BLOOD LEFT ARM   Final   Special Requests BOTTLES DRAWN AEROBIC ONLY 6CC   Final   Culture  Setup Time 09/19/2012 20:32   Final   Culture     Final   Value:        BLOOD CULTURE RECEIVED NO GROWTH TO DATE CULTURE WILL BE HELD FOR 5 DAYS BEFORE ISSUING A FINAL NEGATIVE REPORT   Report Status PENDING   Incomplete  CULTURE, BLOOD (ROUTINE X 2)     Status: None   Collection Time    09/19/12 10:45 AM      Result Value Range Status   Specimen Description BLOOD RIGHT ARM   Final   Special Requests BOTTLES DRAWN AEROBIC ONLY 10CC   Final   Culture  Setup Time 09/19/2012 20:32   Final   Culture     Final   Value:        BLOOD CULTURE RECEIVED NO GROWTH TO DATE CULTURE WILL BE HELD FOR 5 DAYS BEFORE ISSUING A FINAL NEGATIVE REPORT   Report Status PENDING   Incomplete  RESPIRATORY VIRUS PANEL     Status: None   Collection Time    09/20/12 10:39 PM      Result Value Range Status   Source - RVPAN NASAL SWAB   Corrected   Comment: CORRECTED ON 07/22 AT 2007: PREVIOUSLY REPORTED AS NASAL SWAB   Respiratory Syncytial Virus A NOT DETECTED   Final   Respiratory Syncytial Virus B NOT DETECTED   Final   Influenza A NOT DETECTED   Final   Influenza B NOT DETECTED   Final   Parainfluenza 1 NOT DETECTED   Final   Parainfluenza 2 NOT DETECTED   Final   Parainfluenza 3 NOT DETECTED   Final   Metapneumovirus NOT DETECTED   Final   Rhinovirus NOT DETECTED   Final   Adenovirus NOT DETECTED   Final   Influenza A H1 NOT DETECTED   Final   Influenza A H3 NOT DETECTED   Final   Comment: (NOTE)           Normal Reference Range for each Analyte: NOT DETECTED     Testing performed using the Luminex xTAG Respiratory Viral Panel test     kit.     This test was  developed and its performance characteristics determined     by [employer]. It has not been cleared or approved by the [place]     Food and Drug Administration. This test is used for clinical purposes.     It should not be regarded as investigational or for research. This     laboratory is certified under the Clinical Laboratory Improvement     Amendments of 1988 (CLIA) as qualified to perform high complexity     clinical laboratory testing.     Studies:  Recent x-ray studies have been reviewed in detail by the Attending Physician  Scheduled Meds:  Scheduled Meds: . apixaban  5 mg Oral BID  . bacitracin   Topical Daily  . docusate sodium  100 mg Oral BID  . folic acid  1 mg Oral Daily  . metoprolol tartrate  25 mg Oral BID  . omega-3 acid ethyl esters  1 g Oral BID  . predniSONE  5 mg Oral Q breakfast  . sodium chloride  3  mL Intravenous Q12H    Time spent on care of this patient:   Rainah Kirshner, MD  [facility] Office  [phone] Pager - Text Page per Loretha Stapler as per below:  On-Call/Text Page:      [email or web address]      password [email or web address]  If 7PM-7AM, please contact night-coverage [email or web address] Password [email or web address] 09/24/2012, 3:48 PM   LOS: 7 days

## 2012-09-24 NOTE — Progress Notes (Signed)
Patient ID: William [name], male   DOB: 05-10-1950, 62 y.o.   MRN: [id number]         [facility] for Infectious Disease    Date of Admission:  09/17/2012     Principal Problem:   Cytomegalovirus mononucleosis Active Problems:   Polymyalgia rheumatica   CAD (coronary artery disease)   Hyperlipidemia   HTN (hypertension)   Atrial flutter   Elevated liver enzymes   . aspirin  81 mg Oral QPM  . bacitracin   Topical Daily  . docusate sodium  100 mg Oral BID  . enoxaparin (LOVENOX) injection  40 mg Subcutaneous Q24H  . folic acid  1 mg Oral Daily  . labetalol  100 mg Oral BID  . omega-3 acid ethyl esters  1 g Oral BID  . predniSONE  5 mg Oral Q breakfast  . sodium chloride  3 mL Intravenous Q12H    Subjective: He says that his appetite is improved and he is now able to be wider variety of foods. He cannot really tell much difference in his chills, fevers or sweats but his wife agrees that he are coming less frequently and seemed to be less severe. He is still having bouts of tachycardia, particularly when he sits or stands. He says he is completely unaware of the tachycardia. He has not had any unusual chest pain or pressure or shortness of breath.  Review of Systems: Pertinent items are noted in HPI.  Past Medical History  Diagnosis Date  . Coronary disease   . Hypercholesterolemia   . Polymyalgia rheumatica   . Bronchitis   . Insomnia   . Tobacco abuse   . History of chest pain   . Hypertension   . Coronary artery disease   . Myocardial infarction   . Shortness of breath   . Pneumonia   . Headache(784.0)   . Atrial flutter 09/19/2012    History  Substance Use Topics  . Smoking status: Current Some Day Smoker    Types: Cigars  . Smokeless tobacco: Not on file  . Alcohol Use: 0.6 oz/week    1 Cans of beer per week    Family History  Problem Relation Age of Onset  . Pancreatic cancer Mother   . Bladder Cancer Father     Allergies  Allergen  Reactions  . Oxycodone Itching  . Iodine Itching  . Zocor (Simvastatin) Other (See Comments)    Pain    Objective: Temp:  [98.3 F (36.8 C)-100.5 F (38.1 C)] 98.4 F (36.9 C) (07/24 1145) Pulse Rate:  [111] 111 (07/24 0758) BP: (93-118)/(58-83) 98/83 mmHg (07/24 1145) SpO2:  [96 %] 96 % (07/24 1145) Weight:  [116.4 kg (256 lb 9.9 oz)] 116.4 kg (256 lb 9.9 oz) (07/24 0500)  General: He is alert and able to joke about his condition Skin: No rash Lungs: Clear Cor: Rapid irregular rhythm Abdomen: Obese, soft and nontender. I cannot feel a liver, spleen or masses.   Lab Results Lab Results  Component Value Date   WBC 12.6* 09/24/2012   HGB 12.6* 09/24/2012   HCT 36.2* 09/24/2012   MCV 93.5 09/24/2012   PLT 260 09/24/2012    Lab Results  Component Value Date   CREATININE 0.92 09/24/2012   BUN 12 09/24/2012   NA 132* 09/24/2012   K 4.2 09/24/2012   CL 101 09/24/2012   CO2 20 09/24/2012    Lab Results  Component Value Date   ALT 99* 09/24/2012  AST 68* 09/24/2012   ALKPHOS 103 09/24/2012   BILITOT 0.7 09/24/2012      Microbiology: Recent Results (from the past 240 hour(s))  CULTURE, BLOOD (ROUTINE X 2)     Status: None   Collection Time    09/17/12  7:40 PM      Result Value Range Status   Specimen Description BLOOD LEFT ANTECUBITAL   Final   Special Requests BOTTLES DRAWN AEROBIC AND ANAEROBIC 10CC   Final   Culture  Setup Time 09/18/2012 02:01   Final   Culture NO GROWTH 5 DAYS   Final   Report Status 09/24/2012 FINAL   Final  CULTURE, BLOOD (ROUTINE X 2)     Status: None   Collection Time    09/17/12  7:47 PM      Result Value Range Status   Specimen Description BLOOD RIGHT ANTECUBITAL   Final   Special Requests BOTTLES DRAWN AEROBIC AND ANAEROBIC 10CC   Final   Culture  Setup Time 09/18/2012 02:01   Final   Culture NO GROWTH 5 DAYS   Final   Report Status 09/24/2012 FINAL   Final  URINE CULTURE     Status: None   Collection Time    09/17/12  7:50 PM      Result  Value Range Status   Specimen Description URINE, RANDOM   Final   Special Requests NONE   Final   Culture  Setup Time 09/17/2012 21:06   Final   Colony Count NO GROWTH   Final   Culture NO GROWTH   Final   Report Status 09/18/2012 FINAL   Final  MRSA PCR SCREENING     Status: None   Collection Time    09/18/12  6:01 PM      Result Value Range Status   MRSA by PCR NEGATIVE  NEGATIVE Final   Comment:            The GeneXpert MRSA Assay (FDA     approved for NASAL specimens     only), is one component of a     comprehensive MRSA colonization     surveillance program. It is not     intended to diagnose MRSA     infection nor to guide or     monitor treatment for     MRSA infections.  CULTURE, BLOOD (ROUTINE X 2)     Status: None   Collection Time    09/19/12 10:40 AM      Result Value Range Status   Specimen Description BLOOD LEFT ARM   Final   Special Requests BOTTLES DRAWN AEROBIC ONLY 6CC   Final   Culture  Setup Time 09/19/2012 20:32   Final   Culture     Final   Value:        BLOOD CULTURE RECEIVED NO GROWTH TO DATE CULTURE WILL BE HELD FOR 5 DAYS BEFORE ISSUING A FINAL NEGATIVE REPORT   Report Status PENDING   Incomplete  CULTURE, BLOOD (ROUTINE X 2)     Status: None   Collection Time    09/19/12 10:45 AM      Result Value Range Status   Specimen Description BLOOD RIGHT ARM   Final   Special Requests BOTTLES DRAWN AEROBIC ONLY 10CC   Final   Culture  Setup Time 09/19/2012 20:32   Final   Culture     Final   Value:        BLOOD CULTURE RECEIVED NO GROWTH TO  DATE CULTURE WILL BE HELD FOR 5 DAYS BEFORE ISSUING A FINAL NEGATIVE REPORT   Report Status PENDING   Incomplete  RESPIRATORY VIRUS PANEL     Status: None   Collection Time    09/20/12 10:39 PM      Result Value Range Status   Source - RVPAN NASAL SWAB   Corrected   Comment: CORRECTED ON 07/22 AT 2007: PREVIOUSLY REPORTED AS NASAL SWAB   Respiratory Syncytial Virus A NOT DETECTED   Final   Respiratory Syncytial  Virus B NOT DETECTED   Final   Influenza A NOT DETECTED   Final   Influenza B NOT DETECTED   Final   Parainfluenza 1 NOT DETECTED   Final   Parainfluenza 2 NOT DETECTED   Final   Parainfluenza 3 NOT DETECTED   Final   Metapneumovirus NOT DETECTED   Final   Rhinovirus NOT DETECTED   Final   Adenovirus NOT DETECTED   Final   Influenza A H1 NOT DETECTED   Final   Influenza A H3 NOT DETECTED   Final   Comment: (NOTE)           Normal Reference Range for each Analyte: NOT DETECTED     Testing performed using the Luminex xTAG Respiratory Viral Panel test     kit.     This test was developed and its performance characteristics determined     by [employer]. It has not been cleared or approved by the [place]     Food and Drug Administration. This test is used for clinical purposes.     It should not be regarded as investigational or for research. This     laboratory is certified under the Clinical Laboratory Improvement     Amendments of 1988 (CLIA) as qualified to perform high complexity     clinical laboratory testing.   Assessment: His protracted, high spiking fevers, lymphocytosis, monocytosis and elevated hepatic transaminases are all perfectly compatible with adult cytomegalovirus mononucleosis. It is not unusual for the febrile illness still has 3-4 weeks before he burns itself out. There is no indication for antiviral therapy. He needs continued symptomatic therapy and time.  Plan: 1. Continue symptomatic therapy 2. Agree with cardiology evaluation  Cliffton Asters, MD [facility] for Infectious Disease [facility] [phone] pager   [phone] cell 09/24/2012, 2:38 PM

## 2012-09-24 NOTE — Consult Note (Signed)
HPI: 62 year old male with past medical history of coronary artery disease status post coronary artery bypass and graft for evaluation of atrial fibrillation. Patient was admitted on July 17 with general malaise, fevers and chills. He has been found to have acute CMV infection. His initial electrocardiogram on July 18 showed atrial flutter with a rapid ventricular response. He has continued in this rhythm and cardiology is asked to evaluate. The patient denies dyspnea, chest pain or palpitations. His fevers, chills and general maladies are slowly improving.  Medications Prior to Admission  Medication Sig Dispense Refill  . albuterol (PROVENTIL HFA;VENTOLIN HFA) 108 (90 BASE) MCG/ACT inhaler Inhale 1-2 puffs into the lungs every 4 (four) hours as needed for wheezing or shortness of breath.      Marland Kitchen amLODipine (NORVASC) 5 MG tablet Take 5 mg by mouth every evening.       Marland Kitchen aspirin 81 MG tablet Take 81 mg by mouth every evening.       Marland Kitchen atorvastatin (LIPITOR) 40 MG tablet Take 40 mg by mouth every evening.      Marland Kitchen doxycycline (VIBRAMYCIN) 100 MG capsule Take 100 mg by mouth 2 (two) times daily. Start date 09/12/12; Duration unknown to pt      . folic acid (FOLVITE) 1 MG tablet Take 1 mg by mouth daily.      Marland Kitchen HYDROmorphone (DILAUDID) 2 MG tablet Take 1 tablet (2 mg total) by mouth every 4 (four) hours as needed for pain.  30 tablet  0  . lisinopril (PRINIVIL,ZESTRIL) 40 MG tablet Take 40 mg by mouth daily.      . magnesium gluconate (MAGONATE) 500 MG tablet Take 500 mg by mouth every evening.       . methotrexate (RHEUMATREX) 2.5 MG tablet Take 22.5 mg by mouth once a week. Sundays      . Multiple Vitamin (MULTI-VITAMIN PO) Take 1 tablet by mouth daily.       . nitroGLYCERIN (NITROSTAT) 0.4 MG SL tablet Place 0.4 mg under the tongue every 5 (five) minutes as needed for chest pain.      . Omega-3 Fatty Acids (FISH OIL) 500 MG CAPS Take 2 capsules by mouth 2 (two) times daily.      . predniSONE (DELTASONE)  5 MG tablet Take 10 mg by mouth daily.       . Testosterone (FORTESTA) 10 MG/ACT (2%) GEL Place 1 application onto the skin daily.        Allergies  Allergen Reactions  . Oxycodone Itching  . Iodine Itching  . Zocor (Simvastatin) Other (See Comments)    Pain    Past Medical History  Diagnosis Date  . Coronary disease   . Hypercholesterolemia   . Polymyalgia rheumatica   . Insomnia   . Tobacco abuse   . Hypertension   . Myocardial infarction   . Pneumonia   . Atrial flutter 09/19/2012    Past Surgical History  Procedure Laterality Date  . Cardiac catheterization  03/16/1999    ejection fraction 65%--After surgery pt. developed hives and itching  . Coronary artery bypass graft  03/19/1999    History   Social History  . Marital Status: Married    Spouse Name: N/A    Number of Children: N/A  . Years of Education: N/A   Occupational History  .      Retired   Social History Main Topics  . Smoking status: Current Some Day Smoker    Types: Cigars  . Smokeless tobacco: Not  on file  . Alcohol Use: 0.6 oz/week    1 Cans of beer per week     Comment: Occasional beer  . Drug Use: No  . Sexually Active: Not on file   Other Topics Concern  . Not on file   Social History Narrative  . No narrative on file    Family History  Problem Relation Age of Onset  . Pancreatic cancer Mother   . Bladder Cancer Father   . Heart failure Father     ROS:  no fevers or chills, productive cough, hemoptysis, dysphasia, odynophagia, melena, hematochezia, dysuria, hematuria, rash, seizure activity, orthopnea, PND, pedal edema, claudication. Remaining systems are negative.  Physical Exam:   Blood pressure 98/83, pulse 111, temperature 98.4 F (36.9 C), temperature source Oral, resp. rate 20, height 5\' 11"  (1.803 m), weight 256 lb 9.9 oz (116.4 kg), SpO2 96.00%.  General:  Well developed/obese in NAD Skin warm/dry Patient not depressed No peripheral  clubbing Back-normal HEENT-normal/normal eyelids Neck supple/normal carotid upstroke bilaterally; no bruits; no JVD; no thyromegaly chest - CTA/ normal expansion CV - irregular, tachycardic/normal S1 and S2; no murmurs, rubs or gallops;  PMI nondisplaced Abdomen -NT/ND, no HSM, no mass, + bowel sounds, no bruit 2+ femoral pulses, no bruits Ext-no edema, chords, 2+ DP Neuro-grossly nonfocal  ECG Atrial flutter, nonspecific ST changes.  Results for orders placed during the hospital encounter of 09/17/12 (from the past 48 hour(s))  OSMOLALITY     Status: Abnormal   Collection Time    09/22/12  3:31 PM      Result Value Range   Osmolality 274 (*) 275 - 300 mOsm/kg  TSH     Status: None   Collection Time    09/22/12  3:31 PM      Result Value Range   TSH 0.951  0.350 - 4.500 uIU/mL  T4, FREE     Status: None   Collection Time    09/22/12  3:31 PM      Result Value Range   Free T4 1.30  0.80 - 1.80 ng/dL  COMPREHENSIVE METABOLIC PANEL     Status: Abnormal   Collection Time    09/23/12  4:03 AM      Result Value Range   Sodium 132 (*) 135 - 145 mEq/L   Potassium 4.0  3.5 - 5.1 mEq/L   Chloride 100  96 - 112 mEq/L   CO2 23  19 - 32 mEq/L   Glucose, Bld 93  70 - 99 mg/dL   BUN 11  6 - 23 mg/dL   Creatinine, Ser 4.09  0.50 - 1.35 mg/dL   Calcium 7.8 (*) 8.4 - 10.5 mg/dL   Total Protein 5.0 (*) 6.0 - 8.3 g/dL   Albumin 2.0 (*) 3.5 - 5.2 g/dL   AST 61 (*) 0 - 37 U/L   ALT 91 (*) 0 - 53 U/L   Alkaline Phosphatase 76  39 - 117 U/L   Total Bilirubin 0.8  0.3 - 1.2 mg/dL   GFR calc non Af Amer 89 (*) >90 mL/min   GFR calc Af Amer >90  >90 mL/min   Comment:            The eGFR has been calculated     using the CKD EPI equation.     This calculation has not been     validated in all clinical     situations.     eGFR's persistently     <90 mL/min  signify     possible Chronic Kidney Disease.  MAGNESIUM     Status: None   Collection Time    09/23/12  4:03 AM      Result Value  Range   Magnesium 2.0  1.5 - 2.5 mg/dL  CBC WITH DIFFERENTIAL     Status: Abnormal   Collection Time    09/23/12  4:03 AM      Result Value Range   WBC 12.2 (*) 4.0 - 10.5 K/uL   RBC 3.86 (*) 4.22 - 5.81 MIL/uL   Hemoglobin 12.7 (*) 13.0 - 17.0 g/dL   HCT 16.1 (*) 09.6 - 04.5 %   MCV 93.5  78.0 - 100.0 fL   MCH 32.9  26.0 - 34.0 pg   MCHC 35.2  30.0 - 36.0 g/dL   RDW 40.9  81.1 - 91.4 %   Platelets 248  150 - 400 K/uL   Neutrophils Relative % 22 (*) 43 - 77 %   Lymphocytes Relative 59 (*) 12 - 46 %   Monocytes Relative 13 (*) 3 - 12 %   Eosinophils Relative 1  0 - 5 %   Basophils Relative 5 (*) 0 - 1 %   Neutro Abs 2.7  1.7 - 7.7 K/uL   Lymphs Abs 7.2 (*) 0.7 - 4.0 K/uL   Monocytes Absolute 1.6 (*) 0.1 - 1.0 K/uL   Eosinophils Absolute 0.1  0.0 - 0.7 K/uL   Basophils Absolute 0.6 (*) 0.0 - 0.1 K/uL   WBC Morphology ABSOLUTE LYMPHOCYTOSIS     Comment: ATYPICAL LYMPHOCYTES  PATHOLOGIST SMEAR REVIEW     Status: None   Collection Time    09/23/12  4:03 AM      Result Value Range   Path Review NORMOCYTIC ANEMIA WITH BASOPHILIC STIPPLING.     Comment: Absolute lymphocytosis. Favor reactive process, recommend clinical correlation.     Reviewed by [place] R. Rund, M.D.     09/23/12.  COMPREHENSIVE METABOLIC PANEL     Status: Abnormal   Collection Time    09/24/12  4:10 AM      Result Value Range   Sodium 132 (*) 135 - 145 mEq/L   Potassium 4.2  3.5 - 5.1 mEq/L   Chloride 101  96 - 112 mEq/L   CO2 20  19 - 32 mEq/L   Glucose, Bld 127 (*) 70 - 99 mg/dL   BUN 12  6 - 23 mg/dL   Creatinine, Ser 7.82  0.50 - 1.35 mg/dL   Calcium 7.5 (*) 8.4 - 10.5 mg/dL   Total Protein 4.7 (*) 6.0 - 8.3 g/dL   Albumin 1.9 (*) 3.5 - 5.2 g/dL   AST 68 (*) 0 - 37 U/L   ALT 99 (*) 0 - 53 U/L   Alkaline Phosphatase 103  39 - 117 U/L   Total Bilirubin 0.7  0.3 - 1.2 mg/dL   GFR calc non Af Amer 88 (*) >90 mL/min   GFR calc Af Amer >90  >90 mL/min   Comment:            The eGFR has been calculated      using the CKD EPI equation.     This calculation has not been     validated in all clinical     situations.     eGFR's persistently     <90 mL/min signify     possible Chronic Kidney Disease.  MAGNESIUM     Status: None   Collection  Time    09/24/12  4:10 AM      Result Value Range   Magnesium 2.0  1.5 - 2.5 mg/dL  CBC WITH DIFFERENTIAL     Status: Abnormal   Collection Time    09/24/12  4:10 AM      Result Value Range   WBC 12.6 (*) 4.0 - 10.5 K/uL   RBC 3.87 (*) 4.22 - 5.81 MIL/uL   Hemoglobin 12.6 (*) 13.0 - 17.0 g/dL   HCT 29.5 (*) 62.1 - 30.8 %   MCV 93.5  78.0 - 100.0 fL   MCH 32.6  26.0 - 34.0 pg   MCHC 34.8  30.0 - 36.0 g/dL   RDW 65.7  84.6 - 96.2 %   Platelets 260  150 - 400 K/uL   Neutrophils Relative % 22 (*) 43 - 77 %   Lymphocytes Relative 62 (*) 12 - 46 %   Monocytes Relative 11  3 - 12 %   Eosinophils Relative 1  0 - 5 %   Basophils Relative 4 (*) 0 - 1 %   Neutro Abs 2.8  1.7 - 7.7 K/uL   Lymphs Abs 7.8 (*) 0.7 - 4.0 K/uL   Monocytes Absolute 1.4 (*) 0.1 - 1.0 K/uL   Eosinophils Absolute 0.1  0.0 - 0.7 K/uL   Basophils Absolute 0.5 (*) 0.0 - 0.1 K/uL   WBC Morphology ATYPICAL LYMPHOCYTES     Comment: SMUDGE CELLS     Assessment/Plan 1 atrial flutter-the patient has newly diagnosed atrial flutter. I think this is most likely the result of his acute CMV illness. Discontinue labetalol. Add metoprolol 25 mg by mouth twice a day. Increase as needed for rate control. Embolic risk factor of hypertension. Add apixaban 5 mg po BID. I would recommend rate control and anticoagulation until he recovers from his acute illness. He could then be seen back in the office and if atrial arrhythmias persists he could be cardioverted 3 weeks after therapeutic anticoagulation. Anticoagulation would then be continued for 4 weeks and could be discontinued at that point. Aspirin will be held while he is on anticoagulation. Check echocardiogram. TSH is normal. 2 coronary artery  disease - hold aspirin given addition of apixaban. Resume statin at discharge. Liver functions have been mildly elevated most likely from CMV. 3 acute CMV infection-management per primary care. 4 hypertension-holding preadmission blood pressure medications at this point as we are adding metoprolol for rate control. 5 polymyalgia rheumatica  Olga Millers MD 09/24/2012, 3:17 PM

## 2012-09-25 DIAGNOSIS — I369 Nonrheumatic tricuspid valve disorder, unspecified: Secondary | ICD-10-CM

## 2012-09-25 LAB — EHRLICHIA ANTIBODY PANEL: E chaffeensis (HGE) Ab, IgM: <1:20 {titer}

## 2012-09-25 LAB — CBC WITH DIFFERENTIAL/PLATELET
Band Neutrophils: 1 % (ref 0–10)
Eosinophils Relative: 0 % (ref 0–5)
HCT: 36.9 % — ABNORMAL LOW (ref 39.0–52.0)
MCV: 93.4 fL (ref 78.0–100.0)
Monocytes Relative: 2 % — ABNORMAL LOW (ref 3–12)
Neutro Abs: 3.7 10*3/uL (ref 1.7–7.7)
RBC: 3.95 MIL/uL — ABNORMAL LOW (ref 4.22–5.81)
WBC: 13.8 10*3/uL — ABNORMAL HIGH (ref 4.0–10.5)

## 2012-09-25 LAB — COMPREHENSIVE METABOLIC PANEL
AST: 81 U/L — ABNORMAL HIGH (ref 0–37)
Albumin: 1.9 g/dL — ABNORMAL LOW (ref 3.5–5.2)
Calcium: 7.7 mg/dL — ABNORMAL LOW (ref 8.4–10.5)
Chloride: 101 mEq/L (ref 96–112)
Creatinine, Ser: 0.77 mg/dL (ref 0.50–1.35)
Total Bilirubin: 0.7 mg/dL (ref 0.3–1.2)

## 2012-09-25 LAB — LACTATE DEHYDROGENASE, ISOENZYMES
LD1/LD2 Ratio: 0.47
LDH 4: 14 % — ABNORMAL HIGH (ref 3–12)
LDH 5: 13 % (ref 3–14)
LDH Isoenzymes, Total: 485 U/L — ABNORMAL HIGH (ref 120–250)

## 2012-09-25 LAB — CULTURE, BLOOD (ROUTINE X 2): Culture: NO GROWTH

## 2012-09-25 LAB — EPSTEIN BARR VRS(EBV DNA BY PCR): EBV DNA QN by PCR: <500 copies/mL (ref <500)

## 2012-09-25 MED ORDER — METOPROLOL TARTRATE 50 MG PO TABS
50.0000 mg | ORAL_TABLET | Freq: Two times a day (BID) | ORAL | Status: DC
  Administered 2012-09-25 – 2012-09-26 (×3): 50 mg via ORAL
  Filled 2012-09-25 (×4): qty 1

## 2012-09-25 MED ORDER — ALPRAZOLAM 0.25 MG PO TABS
0.2500 mg | ORAL_TABLET | Freq: Once | ORAL | Status: AC
  Administered 2012-09-26: 0.25 mg via ORAL
  Filled 2012-09-25: qty 1

## 2012-09-25 MED ORDER — MAGNESIUM CITRATE PO SOLN
1.0000 | Freq: Every day | ORAL | Status: DC | PRN
  Filled 2012-09-25: qty 296

## 2012-09-25 MED ORDER — BISACODYL 10 MG RE SUPP: 10.0000 mg | Freq: Every day | RECTAL | Status: DC | PRN

## 2012-09-25 MED ORDER — POLYETHYLENE GLYCOL 3350 17 G PO PACK
17.0000 g | PACK | Freq: Two times a day (BID) | ORAL | Status: DC
  Administered 2012-09-25 – 2012-09-28 (×4): 17 g via ORAL
  Filled 2012-09-25 (×10): qty 1

## 2012-09-25 MED ORDER — DILTIAZEM HCL 30 MG PO TABS
30.0000 mg | ORAL_TABLET | Freq: Four times a day (QID) | ORAL | Status: DC
  Administered 2012-09-25 – 2012-09-26 (×4): 30 mg via ORAL
  Filled 2012-09-25 (×8): qty 1

## 2012-09-25 NOTE — Progress Notes (Signed)
[facility] Progress Note [place] TEAM 1 - Stepdown/ICU TEAM   Pt stable for transfer to tele bed - and order written for same.    RN called report to tele unit and was informed they are "NOT ALLOWED TO CARE FOR PATIENTS WHO ARE USING COOLING BLANKETS."  RN informed floor that the blanket was for comfort only, and that pt was NOT clinically hyperthermic.  Nonetheless, the tele floor REFUSED THE TRANSFER.    THEREFORE, PT IS REMAINING IN THE SDU SOLEY DUE TO THE FACT THAT A COOLING BLANKET ADDS TO HIS COMFORT WHEN HE EXPERIENCES FEVER.   Lonia Blood, MD [facility] Office  [phone] Pager [phone]  On-Call/Text Page:      [email or web address]      password [facility]

## 2012-09-25 NOTE — Progress Notes (Signed)
[facility] Progress Note [place] TEAM 1 - Stepdown/ICU CUNG MASTERSON AVW:[id number] DOB: 1950/10/08 DOA: 09/17/2012 PCP: Vivien Presto, MD  Brief narrative: 62 y.o. Male w/ PMHx polymyalgia rheumatica, CAD status post CABG, hyperlipidemia and hypertension who had been experiencing fever and chills over one week. Patient had come to the ER 2 days after his symptoms had started and was placed on doxycycline. Patient was on doxycycline for 2 days after which his fever subsided and he travelled to [place]. There he started spiking fevers again and he had gone to an ER and was placed on Bactrim empirically. As per the family workup done over there was negative for any source. Despite taking Bactrim for 2-3 days patient's fever persisted and therefore he came back to the ER 7/17.   Prior to this episode the patient had been having some joint aches and his rheumatologist was planning to increase his prednisone from 5 mg to 10 which was not done because patient started developing the fever.   In the ER the patient's LFTs were found to be mildly elevated. He denied any insect bites. However patient does camp and fish regularly.  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 - care as suggested by ID - symptomatic management  Polymyalgia rheumatica No evidence of acute flair at this time - holding MTX for now   CAD s/p CABG Followed by Corinda Gubler - no chest pain at this time   Hyperlipidemia Resume home Lipitor before d/c home   HTN  Under reasonable control on current medication/bordrline low due to hypovolemia - hydrate and follow   Hyponatremia Most c/w volume depletion (likely insensitive loss due to hectic fevers) - cont to hydrate and follow - Na is improving   Atrial flutter New since this admission - likely brought about by hypovolemia and acute illness - care as per Cardiolgoy  Elevated liver enzymes - hepatic steatosis (on  CT) resolving  Code Status: FULL Family Communication: spoke w/ pt and wife Disposition Plan: stable for transfer to tele bed   Consultants: ID Cardiology   Procedures: none  Antibiotics: none  DVT prophylaxis: lovenox  HPI/Subjective: Pt continues to have fevers and chills. Otherwise denies n/v, sob, abdom pain.  Does c/o constipation.  Objective: Blood pressure 105/50, pulse 88, temperature 98.8 F (37.1 C), temperature source Oral, resp. rate 18, height 5\' 11"  (1.803 m), weight 116.4 kg (256 lb 9.9 oz), SpO2 94.00%.  Intake/Output Summary (Last 24 hours) at 09/25/12 1142 Last data filed at 09/25/12 0900  Gross per 24 hour  Intake   2400 ml  Output   1200 ml  Net   1200 ml   Exam: General: No acute respiratory distress Lungs: Clear to auscultation bilaterally without wheezes or crackles Cardiovascular: Regular rate without murmur gallop or rub Abdomen: overweight, nontender, soft, bowel sounds positive, no rebound, no ascites, no appreciable mass Extremities: No significant cyanosis, clubbing, or edema bilateral lower extremities  Data Reviewed: Basic Metabolic Panel:  Recent Labs Lab 09/21/12 0455 09/22/12 0350 09/23/12 0403 09/24/12 0410 09/25/12 0340  NA 126* 129* 132* 132* 132*  K 3.5 4.5 4.0 4.2 4.0  CL 94* 97 100 101 101  CO2 25 24 23 20 24   GLUCOSE 108* 94 93 127* 86  BUN 17 14 11 12 10   CREATININE 0.91 0.95 0.89 0.92 0.77  CALCIUM 8.2* 8.0* 7.8* 7.5* 7.7*  MG 2.0 2.1 2.0 2.0 1.9   Liver Function Tests:  Recent Labs  Lab 09/21/12 0455 09/22/12 0350 09/23/12 0403 09/24/12 0410 09/25/12 0340  AST 46* 50* 61* 68* 81*  ALT 100* 89* 91* 99* 111*  ALKPHOS 77 76 76 103 118*  BILITOT 0.9 1.1 0.8 0.7 0.7  PROT 5.2* 5.2* 5.0* 4.7* 5.2*  ALBUMIN 2.2* 2.2* 2.0* 1.9* 1.9*   CBC:  Recent Labs Lab 09/21/12 0455 09/22/12 0350 09/23/12 0403 09/24/12 0410 09/25/12 0340  WBC 8.7 9.3 12.2* 12.6* 13.8*  NEUTROABS 3.4 3.7 2.7 2.8 3.7  HGB 14.0  13.5 12.7* 12.6* 12.9*  HCT 39.8 38.3* 36.1* 36.2* 36.9*  MCV 91.7 93.0 93.5 93.5 93.4  PLT 191 227 248 260 283   Cardiac Enzymes:  Recent Labs Lab 09/18/12 1619  09/19/12 1051 09/19/12 1556 09/19/12 2225 09/20/12 0340 09/20/12 0957 09/20/12 1529  CKTOTAL 91  --  111  --   --   --   --   --   CKMB 1.9  --  3.2  --   --   --   --   --   TROPONINI  --   < > <0.30 <0.30 <0.30 <0.30 <0.30 <0.30  < > = values in this interval not displayed.   Recent Results (from the past 240 hour(s))  CULTURE, BLOOD (ROUTINE X 2)     Status: None   Collection Time    09/17/12  7:40 PM      Result Value Range Status   Specimen Description BLOOD LEFT ANTECUBITAL   Final   Special Requests BOTTLES DRAWN AEROBIC AND ANAEROBIC 10CC   Final   Culture  Setup Time 09/18/2012 02:01   Final   Culture NO GROWTH 5 DAYS   Final   Report Status 09/24/2012 FINAL   Final  CULTURE, BLOOD (ROUTINE X 2)     Status: None   Collection Time    09/17/12  7:47 PM      Result Value Range Status   Specimen Description BLOOD RIGHT ANTECUBITAL   Final   Special Requests BOTTLES DRAWN AEROBIC AND ANAEROBIC 10CC   Final   Culture  Setup Time 09/18/2012 02:01   Final   Culture NO GROWTH 5 DAYS   Final   Report Status 09/24/2012 FINAL   Final  URINE CULTURE     Status: None   Collection Time    09/17/12  7:50 PM      Result Value Range Status   Specimen Description URINE, RANDOM   Final   Special Requests NONE   Final   Culture  Setup Time 09/17/2012 21:06   Final   Colony Count NO GROWTH   Final   Culture NO GROWTH   Final   Report Status 09/18/2012 FINAL   Final  MRSA PCR SCREENING     Status: None   Collection Time    09/18/12  6:01 PM      Result Value Range Status   MRSA by PCR NEGATIVE  NEGATIVE Final   Comment:            The GeneXpert MRSA Assay (FDA     approved for NASAL specimens     only), is one component of a     comprehensive MRSA colonization     surveillance program. It is not     intended  to diagnose MRSA     infection nor to guide or     monitor treatment for     MRSA infections.  CULTURE, BLOOD (ROUTINE X 2)     Status:  None   Collection Time    09/19/12 10:40 AM      Result Value Range Status   Specimen Description BLOOD LEFT ARM   Final   Special Requests BOTTLES DRAWN AEROBIC ONLY [facility]   Final   Culture  Setup Time 09/19/2012 20:32   Final   Culture NO GROWTH 5 DAYS   Final   Report Status 09/25/2012 FINAL   Final  CULTURE, BLOOD (ROUTINE X 2)     Status: None   Collection Time    09/19/12 10:45 AM      Result Value Range Status   Specimen Description BLOOD RIGHT ARM   Final   Special Requests BOTTLES DRAWN AEROBIC ONLY 10CC   Final   Culture  Setup Time 09/19/2012 20:32   Final   Culture NO GROWTH 5 DAYS   Final   Report Status 09/25/2012 FINAL   Final  RESPIRATORY VIRUS PANEL     Status: None   Collection Time    09/20/12 10:39 PM      Result Value Range Status   Source - RVPAN NASAL SWAB   Corrected   Comment: CORRECTED ON 07/22 AT 2007: PREVIOUSLY REPORTED AS NASAL SWAB   Respiratory Syncytial Virus A NOT DETECTED   Final   Respiratory Syncytial Virus B NOT DETECTED   Final   Influenza A NOT DETECTED   Final   Influenza B NOT DETECTED   Final   Parainfluenza 1 NOT DETECTED   Final   Parainfluenza 2 NOT DETECTED   Final   Parainfluenza 3 NOT DETECTED   Final   Metapneumovirus NOT DETECTED   Final   Rhinovirus NOT DETECTED   Final   Adenovirus NOT DETECTED   Final   Influenza A H1 NOT DETECTED   Final   Influenza A H3 NOT DETECTED   Final   Comment: (NOTE)           Normal Reference Range for each Analyte: NOT DETECTED     Testing performed using the Luminex xTAG Respiratory Viral Panel test     kit.     This test was developed and its performance characteristics determined     by [employer]. It has not been cleared or approved by the [place]     Food and Drug Administration. This test is used for clinical purposes.     It should not be  regarded as investigational or for research. This     laboratory is certified under the Clinical Laboratory Improvement     Amendments of 1988 (CLIA) as qualified to perform high complexity     clinical laboratory testing.     Studies:  Recent x-ray studies have been reviewed in detail by the Attending Physician  Scheduled Meds:  Scheduled Meds: . apixaban  5 mg Oral BID  . bacitracin   Topical Daily  . diltiazem  30 mg Oral Q6H  . docusate sodium  100 mg Oral BID  . folic acid  1 mg Oral Daily  . metoprolol tartrate  50 mg Oral BID  . omega-3 acid ethyl esters  1 g Oral BID  . predniSONE  5 mg Oral Q breakfast  . sodium chloride  3 mL Intravenous Q12H    Time spent on care of this patient:   Lonia Blood, MD  [facility] Office  [phone] Pager - Text Page per Loretha Stapler as per below:  On-Call/Text Page:      [email or web address]  password [email or web address]  If 7PM-7AM, please contact night-coverage [email or web address] Password [facility] 09/25/2012, 11:42 AM   LOS: 8 days

## 2012-09-25 NOTE — Progress Notes (Signed)
Echocardiogram 2D Echocardiogram has been performed.  William [name] 09/25/2012, 1:43 PM

## 2012-09-25 NOTE — Progress Notes (Signed)
Patient ID: William [name], male   DOB: [birth year], 62 y.o.   MRN: [id number]         [facility]    Date of Admission:  09/17/2012     Principal Problem:   Cytomegalovirus mononucleosis Active Problems:   Polymyalgia rheumatica   CAD (coronary artery disease)   Hyperlipidemia   HTN (hypertension)   Atrial flutter   Elevated liver enzymes  Subjective: He is still feeling miserable with recurrent chills, high spiking fevers and sweats.  Objective: Temp:  [97.9 F (36.6 C)-102.9 F (39.4 C)] 98.7 F (37.1 C) (07/25 1607) Pulse Rate:  [71-145] 71 (07/25 1216) Resp:  [16-18] 18 (07/25 0000) BP: (105-149)/(50-96) 149/96 mmHg (07/25 1607) SpO2:  [94 %-98 %] 95 % (07/25 1607)  General: He is currently covered in blankets and shivering Skin: No rash Lungs: Clear Cor: Irregular S1 and S2 no murmurs Abdomen: Obese, soft and nontender  Lab Results Lab Results  Component Value Date   WBC 13.8* 09/25/2012   HGB 12.9* 09/25/2012   HCT 36.9* 09/25/2012   MCV 93.4 09/25/2012   PLT 283 09/25/2012    Lab Results  Component Value Date   CREATININE 0.77 09/25/2012   BUN 10 09/25/2012   NA 132* 09/25/2012   K 4.0 09/25/2012   CL 101 09/25/2012   CO2 24 09/25/2012    Lab Results  Component Value Date   ALT 111* 09/25/2012   AST 81* 09/25/2012   ALKPHOS 118* 09/25/2012   BILITOT 0.7 09/25/2012    Transthoracic Echocardiography  Patient: William, [name] MR #: [id number] Study Date: 09/25/2012  ------------------------------------------------------------ Study Conclusions  - Left ventricle: The cavity size was normal. Systolic function was normal. The estimated ejection fraction was in the range of 55% to 60%. Wall motion was normal; there were no regional wall motion abnormalities. - Aortic valve: Calcified non coronary cusp - Mitral valve: Calcified annulus. - Atrial septum: Poorly visualized seems lipomatous and mobile but no obvious  PFO Transthoracic echocardiography. M-mode, complete 2D, spectral Doppler, and color Doppler. Height: Height: 180.3cm. Height: 71in. Weight: Weight: 116.4kg. Weight: 256lb. Body mass index: BMI: 35.8kg/m^2. Body surface area: BSA: 2.55m^2. Blood pressure: 107/68. Patient status: Inpatient. 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 He does not have any evidence of myopericarditis by echocardiogram. There is no proven indication for antiviral therapy in this setting but there are case reports suggesting improvement in some patients with more severe disease. I will see him again in the morning and consider a trial of oral valganciclovir.  Plan: 1. Consider oral valganciclovir 2. I will followup in the morning  Cliffton Asters, MD [facility] Infect[facility] Disease [facility] [phone] pager   [phone] cell 09/25/2012, 4:36 PM

## 2012-09-25 NOTE — Progress Notes (Signed)
TELEMETRY: Reviewed telemetry pt in atrial fibrillation/flutter rate 110. Increases to 145 bpm with any activity.: Filed Vitals:   09/24/12 2100 09/25/12 0000 09/25/12 0400 09/25/12 0814  BP: 141/82 125/84 113/61 105/50  Pulse: 145   88  Temp:  99.9 F (37.7 C) 100.5 F (38.1 C) 98.8 F (37.1 C)  TempSrc:  Oral Oral Oral  Resp:  18    Height:      Weight:      SpO2:  97% 97% 94%    Intake/Output Summary (Last 24 hours) at 09/25/12 0841 Last data filed at 09/25/12 0600  Gross per 24 hour  Intake   2760 ml  Output    600 ml  Net   2160 ml    SUBJECTIVE Still having intermittent fever and chills. No chest pain, SOB, or palpitations.  LABS: Basic Metabolic Panel:  Recent Labs  16/10/96 0410 09/25/12 0340  NA 132* 132*  K 4.2 4.0  CL 101 101  CO2 20 24  GLUCOSE 127* 86  BUN 12 10  CREATININE 0.92 0.77  CALCIUM 7.5* 7.7*  MG 2.0 1.9   Liver Function Tests:  Recent Labs  09/24/12 0410 09/25/12 0340  AST 68* 81*  ALT 99* 111*  ALKPHOS 103 118*  BILITOT 0.7 0.7  PROT 4.7* 5.2*  ALBUMIN 1.9* 1.9*   CBC:  Recent Labs  09/24/12 0410 09/25/12 0340  WBC 12.6* 13.8*  NEUTROABS 2.8 3.7  HGB 12.6* 12.9*  HCT 36.2* 36.9*  MCV 93.5 93.4  PLT 260 283   Thyroid Function Tests:  Recent Labs  09/22/12 1531  TSH 0.951    Radiology/Studies:  Ct Abdomen Pelvis Wo Contrast  09/18/2012   *RADIOLOGY REPORT*  Clinical Data: Lower abdominal pain and fever.  CT ABDOMEN AND PELVIS WITHOUT CONTRAST  Technique:  Multidetector CT imaging of the abdomen and pelvis was performed following the standard protocol without intravenous contrast.  Comparison: MRI of the lumbar spine and right hip performed 03/17/2010  Findings: The visualized lung bases are clear.  The liver and spleen are unremarkable in appearance.  The gallbladder is within normal limits.  The pancreas and adrenal glands are unremarkable.  Nonspecific perinephric stranding is noted bilaterally.  Small  bilateral parapelvic renal cysts are seen.  Calcifications at the right renal hilum appear to be vascular in nature.  No free fluid is identified.  The small bowel is unremarkable in appearance.  The stomach is within normal limits.  No acute vascular abnormalities are seen.  Scattered calcification is noted along the abdominal aorta and its branches.  A small focus of fat adjacent to the third segment of the duodenum may reflect a small lipoma or a partially calcified epiploic appendage.  The appendix is normal in caliber and contains contrast, without evidence for appendicitis.  Contrast progresses to the level of the distal descending colon.  The colon is unremarkable in appearance.  The bladder is mildly distended and grossly unremarkable in appearance.  A small left inguinal hernia is seen, containing only fat.  The prostate remains normal in size.  No inguinal lymphadenopathy is seen.  No acute osseous abnormalities are identified.  Vacuum phenomenon is noted at L4-L5 and L5-S1.  IMPRESSION:  1.  No acute abnormality seen within the abdomen or pelvis. 2.  Nonspecific perinephric stranding noted bilaterally; small bilateral parapelvic renal cysts seen. 3.  Scattered calcification along the abdominal aorta and its branches. 4.  Small left inguinal hernia, containing only fat.   Original  Report Authenticated By: Tonia Ghent, M.D.   Dg Chest 2 View  09/17/2012   *RADIOLOGY REPORT*  Clinical Data: Fever for 8 days.  History of smoking.  CHEST - 2 VIEW  Comparison: Chest radiograph performed 09/12/2012.  Findings: The lungs are well-aerated and clear.  There is no evidence of focal opacification, pleural effusion or pneumothorax.  The heart is normal in size; the patient is status post median sternotomy, with evidence of prior CABG.  No acute osseous abnormalities are seen.  IMPRESSION: No acute cardiopulmonary process seen.   Original Report Authenticated By: Tonia Ghent, M.D.   Dg Chest 2  View  09/12/2012   *RADIOLOGY REPORT*  Clinical Data: Cough and fever  CHEST - 2 VIEW  Comparison: 06/14/2012  Findings: Prior CABG.  Lungs are not clear without infiltrate or effusion.  Bibasilar atelectasis has resolved since the prior study.  Negative for heart failure or mass lesion.  IMPRESSION: No active cardiopulmonary abnormality.   Original Report Authenticated By: Janeece Riggers, M.D.   Ct Head Wo Contrast  09/18/2012   *RADIOLOGY REPORT*  Clinical Data: 62 year old male with fever of unknown origin.  CT HEAD WITHOUT CONTRAST  Technique:  Contiguous axial images were obtained from the base of the skull through the vertex without contrast.  Comparison: Brain and cervical spine MRI 10/14/2018 09/2005.  Findings: Paranasal sinus mucosal thickening has decreased, residual primarily in the left ethmoid air cells.  Mastoids are clear. No acute osseous abnormality identified.  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  Visualized orbits and scalp soft tissues are within normal limits.  Calcified atherosclerosis at the skull base.  Normal cerebral volume.  No midline shift, ventriculomegaly, mass effect, evidence of mass lesion, intracranial hemorrhage or evidence of cortically based acute infarction.  Gray-white matter differentiation is within normal limits throughout the brain.  No suspicious intracranial vascular hyperdensity.  IMPRESSION: 1. Normal noncontrast CT appearance of the brain. 2.  Mild paranasal sinus inflammatory changes, decreased since 2007.   Original Report Authenticated By: Erskine Speed, M.D.   Ct Chest Wo Contrast  09/18/2012   *RADIOLOGY REPORT*  Clinical Data: Fever, elevated liver function tests  CT CHEST WITHOUT CONTRAST  Technique:  Multidetector CT imaging of the chest was performed following the standard protocol without IV contrast.  Comparison: CT abdomen/pelvis same date, chest CT 06/14/2012  Findings: Evidence of CABG noted.  Moderate atheromatous aortic  calcification noted without aneurysm.  Heart size is normal.  No pericardial or pleural effusion.  Incomplete imaging of the upper abdomen redemonstrates hepatic hypodensity compatible with steatosis.  Curvilinear left lower lobe scarring is re-identified.  Trace pleural fluid or thickening is noted.  The lungs are otherwise clear.  Central airways are patent.  Subacute left posterior seventh and eighth rib fractures are noted.  IMPRESSION: No acute cardiopulmonary process.  Hepatic steatosis is redemonstrated, which may account for the history of elevated liver function tests.   Original Report Authenticated By: Christiana Pellant, M.D.   Ecg: Atrial flutter with RVR. Nonspecific ST abnormality.  PHYSICAL EXAM General: Well developed, obese, in no acute distress. Head: Normocephalic, atraumatic, sclera non-icteric, no xanthomas, nares are without discharge. Neck: Negative for carotid bruits. JVD not elevated. Lungs: Clear bilaterally to auscultation without wheezes, rales, or rhonchi. Breathing is unlabored. Heart: IRRR, tachy, S1 S2 without murmurs, rubs, or gallops.  Abdomen: Soft, non-tender, non-distended with normoactive bowel sounds. No hepatomegaly. No rebound/guarding. No obvious abdominal masses. Msk:  Strength and tone appears normal for  age. Extremities: No clubbing, cyanosis or edema.  Distal pedal pulses are 2+ and equal bilaterally. Neuro: Alert and oriented X 3. Moves all extremities spontaneously. Psych:  Responds to questions appropriately with a normal affect.  ASSESSMENT AND PLAN: 1. Atrial fib/flutter with RVR. Heart rate control suboptimal. Will increase metoprolol to 50 mg bid and add diltiazem 30 mg qid. Continue Eliquis for anticoagulation. ASA discontinued. Check Echo today. For now will treat with rate control and anticoagulation. Once febrile illness resolved will consider DCCV in 4 weeks if still out of rhythm. 2. CAD s/p CABG 2001. Myoview 3/12 showed small inferobasal  scar with mild peri-infarct ischemia. Continue medical management. 3. CMV infection. 4. HL 5. PMR  Principal Problem:   Cytomegalovirus mononucleosis Active Problems:   Polymyalgia rheumatica   CAD (coronary artery disease)   Hyperlipidemia   HTN (hypertension)   Atrial flutter   Elevated liver enzymes    Signed, Peter [place]az[place] MD

## 2012-09-26 LAB — COMPREHENSIVE METABOLIC PANEL
ALT: 145 U/L — ABNORMAL HIGH (ref 0–53)
BUN: 10 mg/dL (ref 6–23)
CO2: 25 mEq/L (ref 19–32)
Calcium: 7.8 mg/dL — ABNORMAL LOW (ref 8.4–10.5)
GFR calc Af Amer: >90 mL/min (ref >90)
GFR calc non Af Amer: >90 mL/min (ref >90)
Glucose, Bld: 120 mg/dL — ABNORMAL HIGH (ref 70–99)
Sodium: 134 mEq/L — ABNORMAL LOW (ref 135–145)
Total Protein: 4.8 g/dL — ABNORMAL LOW (ref 6.0–8.3)

## 2012-09-26 LAB — CBC WITH DIFFERENTIAL/PLATELET
Basophils Absolute: 0.1 10*3/uL (ref 0.0–0.1)
Eosinophils Relative: 3 % (ref 0–5)
Hemoglobin: 12.6 g/dL — ABNORMAL LOW (ref 13.0–17.0)
Lymphocytes Relative: 64 % — ABNORMAL HIGH (ref 12–46)
Lymphs Abs: 9.1 10*3/uL — ABNORMAL HIGH (ref 0.7–4.0)
MCH: 31.8 pg (ref 26.0–34.0)
MCHC: 33.5 g/dL (ref 30.0–36.0)
Monocytes Absolute: 0.9 10*3/uL (ref 0.1–1.0)
Neutrophils Relative %: 24 % — ABNORMAL LOW (ref 43–77)
Platelets: 244 10*3/uL (ref 150–400)
RBC: 3.96 MIL/uL — ABNORMAL LOW (ref 4.22–5.81)

## 2012-09-26 LAB — MAGNESIUM: Magnesium: 2 mg/dL (ref 1.5–2.5)

## 2012-09-26 MED ORDER — DILTIAZEM HCL 30 MG PO TABS
30.0000 mg | ORAL_TABLET | Freq: Four times a day (QID) | ORAL | Status: AC
  Administered 2012-09-26: 30 mg via ORAL
  Filled 2012-09-26: qty 1

## 2012-09-26 MED ORDER — METOPROLOL TARTRATE 100 MG PO TABS
100.0000 mg | ORAL_TABLET | Freq: Two times a day (BID) | ORAL | Status: DC
  Administered 2012-09-26 – 2012-09-29 (×6): 100 mg via ORAL
  Filled 2012-09-26 (×7): qty 1

## 2012-09-26 MED ORDER — DILTIAZEM HCL ER COATED BEADS 120 MG PO CP24
120.0000 mg | ORAL_CAPSULE | Freq: Every day | ORAL | Status: DC
  Filled 2012-09-26: qty 1

## 2012-09-26 MED ORDER — DILTIAZEM HCL 30 MG PO TABS: 30.0000 mg | ORAL_TABLET | Freq: Four times a day (QID) | ORAL | Status: DC

## 2012-09-26 NOTE — Progress Notes (Signed)
Patient ID: William [name], male   DOB: 04/04/1950, 62 y.o.   MRN: [id number]         [facility]    Date of Admission:  09/17/2012             Principal Problem:   Cytomegalovirus mononucleosis Active Problems:   Polymyalgia rheumatica   CAD (coronary artery disease)   Hyperlipidemia   HTN (hypertension)   Atrial flutter   Elevated liver enzymes   . apixaban  5 mg Oral BID  . bacitracin   Topical Daily  . docusate sodium  100 mg Oral BID  . folic acid  1 mg Oral Daily  . metoprolol tartrate  100 mg Oral BID  . omega-3 acid ethyl esters  1 g Oral BID  . polyethylene glycol  17 g Oral BID  . predniSONE  5 mg Oral Q breakfast  . sodium chloride  3 mL Intravenous Q12H    Subjective: He is feeling better today. He has not had any chills, documented fevers or sweats overnight.  Objective: Temp:  [98.1 F (36.7 C)-99 F (37.2 C)] 98.1 F (36.7 C) (07/26 1200) Pulse Rate:  [84-124] 84 (07/26 1200) Resp:  [14-18] 15 (07/26 1200) BP: (106-151)/(56-96) 127/79 mmHg (07/26 1200) SpO2:  [93 %-95 %] 93 % (07/26 1200)  General: He appears more comfortable Skin: No rash Lungs: Clear Cor: Heart rate well controlled at rest Abdomen: Nontender   Lab Results Lab Results  Component Value Date   WBC 14.2* 09/26/2012   HGB 12.6* 09/26/2012   HCT 37.6* 09/26/2012   MCV 94.9 09/26/2012   PLT 244 09/26/2012    Lab Results  Component Value Date   CREATININE 0.81 09/26/2012   BUN 10 09/26/2012   NA 134* 09/26/2012   K 4.6 09/26/2012   CL 101 09/26/2012   CO2 25 09/26/2012    Lab Results  Component Value Date   ALT 145* 09/26/2012   AST 101* 09/26/2012   ALKPHOS 127* 09/26/2012   BILITOT 0.9 09/26/2012      Assessment: He has not had any documented fevers in the past 24 hours. His transthoracic echocardiogram did not reveal any evidence of myopericarditis so I will hold off on starting any antiviral therapy for CMV.  Plan: 1. Continue symptomatic  therapy 2. Consider discharge home soon  Cliffton Asters, MD [facility] [phone] pager   [phone] cell 09/26/2012, 2:57 PM

## 2012-09-26 NOTE — Progress Notes (Addendum)
Subjective:  No chest pain or dyspnea.  Heart rate 70 at rest.  Objective:  Vital Signs in the last 24 hours: Temp:  [97.9 F (36.6 C)-99 F (37.2 C)] 98.1 F (36.7 C) (07/26 0810) Pulse Rate:  [71-124] 124 (07/25 2151) Resp:  [14-18] 14 (07/26 0352) BP: (106-151)/(56-96) 134/71 mmHg (07/26 0810) SpO2:  [93 %-95 %] 94 % (07/26 0810)  Intake/Output from previous day: 07/25 0701 - 07/26 0700 In: 2257.9 [P.O.:240; I.V.:2017.9] Out: 1100 [Urine:1100] Intake/Output from this shift: Total I/O In: -  Out: 200 [Urine:200]  . apixaban  5 mg Oral BID  . bacitracin   Topical Daily  . diltiazem  30 mg Oral Q6H  . docusate sodium  100 mg Oral BID  . folic acid  1 mg Oral Daily  . metoprolol tartrate  100 mg Oral BID  . omega-3 acid ethyl esters  1 g Oral BID  . polyethylene glycol  17 g Oral BID  . predniSONE  5 mg Oral Q breakfast  . sodium chloride  3 mL Intravenous Q12H      Physical Exam: The patient appears to be in no distress.  Head and neck exam reveals that the pupils are equal and reactive.  The extraocular movements are full.  There is no scleral icterus.  Mouth and pharynx are benign.  No lymphadenopathy.  No carotid bruits.  The jugular venous pressure is normal.  Thyroid is not enlarged or tender.  Chest is clear to percussion and auscultation.  No rales or rhonchi.  Expansion of the chest is symmetrical.  Heart reveals no abnormal lift or heave.  First and second heart sounds are normal.  There is no murmur gallop rub or click.  The abdomen is soft and nontender.  Bowel sounds are normoactive.  There is no hepatosplenomegaly or mass.  There are no abdominal bruits.  Extremities reveal no phlebitis or edema.  Pedal pulses are good.  There is no cyanosis or clubbing.  Neurologic exam is normal strength and no lateralizing weakness.  No sensory deficits.  Integument reveals no rash  Lab Results:  Recent Labs  09/25/12 0340 09/26/12 0405  WBC 13.8* 14.2*    HGB 12.9* 12.6*  PLT 283 244    Recent Labs  09/25/12 0340 09/26/12 0405  NA 132* 134*  K 4.0 4.6  CL 101 101  CO2 24 25  GLUCOSE 86 120*  BUN 10 10  CREATININE 0.77 0.81   No results found for this basename: TROPONINI, CK, MB,  in the last 72 hours Hepatic Function Panel  Recent Labs  09/26/12 0405  PROT 4.8*  ALBUMIN 1.8*  AST 101*  ALT 145*  ALKPHOS 127*  BILITOT 0.9   No results found for this basename: CHOL,  in the last 72 hours No results found for this basename: PROTIME,  in the last 72 hours  Imaging: No results found.  Cardiac Studies:  Study Conclusions  - Left ventricle: The cavity size was normal. Systolic function was normal. The estimated ejection fraction was in the range of 55% to 60%. Wall motion was normal; there were no regional wall motion abnormalities. - Aortic valve: Calcified non coronary cusp - Mitral valve: Calcified annulus. - Atrial septum: Poorly visualized seems lipomatous and mobile but no obvious PFO  Assessment/Plan:  1. Atrial fib/flutter with RVR. Heart rate better controlled on higher dose of metoprolol and on diltiazem. Continue Eliquis for anticoagulation. ASA discontinued.  For now will treat with rate control  and anticoagulation. Once febrile illness resolved will consider DCCV in 4 weeks if still out of rhythm.  2. CAD s/p CABG 2001. Myoview 3/12 showed small inferobasal scar with mild peri-infarct ischemia. Continue medical management.  3. CMV infection.  4. HL  5. PMR 6. Abnormal LFTs, worse today. Worsening LFTs may be secondary to adverse effect of cardizem.   Plan: continue rate control and eliquis. Will stop cardizem, continue metoprolol 100 mg BID for rate control. Recheck CMET in am    LOS: 9 days    Cassell Clement 09/26/2012, 10:44 AM

## 2012-09-26 NOTE — Progress Notes (Addendum)
[facility] Progress Note [place] TEAM 1 - Stepdown/ICU William [name] ZOX:[id number] DOB: [birth year] DOA: 09/17/2012 PCP: Vivien Presto, MD  Brief narrative: 62 y.o. Male w/ PMHx polymyalgia rheumatica, CAD status post CABG, hyperlipidemia and hypertension who had been experiencing fever and chills over one week. Patient had come to the ER 2 days after his symptoms had started and was placed on doxycycline. Patient was on doxycycline for 2 days after which his fever subsided and he travelled to [place]. There he started spiking fevers again and he had gone to an ER and was placed on Bactrim empirically. As per the family workup done over there was negative for any source. Despite taking Bactrim for 2-3 days patient's fever persisted and therefore he came back to the ER 7/17.   Prior to this episode the patient had been having some joint aches and his rheumatologist was planning to increase his prednisone from 5 mg to 10 which was not done because patient started developing the fever.   In the ER the patient's LFTs were found to be mildly elevated. He denied any insect bites. However patient does camp and fish regularly.  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 - care as suggested by ID - symptomatic management to continue   Polymyalgia rheumatica No evidence of acute flair at this time - holding MTX for now   CAD s/p CABG Followed by Corinda Gubler - no chest pain at this time   Atrial flutter New since this admission - likely brought about by hypovolemia and acute illness - care as per Cardiolgoy  Hyperlipidemia Resume home Lipitor before d/c home   HTN  With hydration BP has now returned to baseline   Hyponatremia Most c/w volume depletion (likely insensitive loss due to hectic fevers) - resolved w/ IVF resuscitation  Hepatic steatosis (on CT) For long term follow up in outpt setting   Code Status: FULL Family  Communication: spoke w/ pt and wife at bedside Disposition Plan: stable for transfer to tele bed, but tele unit will not accept transfer due to pt using cooling blanket prn comfort therefore remains in SDU  Consultants: ID Cardiology   Procedures: none  Antibiotics: none  DVT prophylaxis: apixaban  HPI/Subjective: Pt continues to have fevers and chills. Otherwise denies n/v, sob, abdom pain.  Did have a BM yesterday.   Objective: Blood pressure 134/71, pulse 124, temperature 98.1 F (36.7 C), temperature source Oral, resp. rate 14, height 5\' 11"  (1.803 m), weight 116.4 kg (256 lb 9.9 oz), SpO2 94.00%.  Intake/Output Summary (Last 24 hours) at 09/26/12 1020 Last data filed at 09/26/12 1000  Gross per 24 hour  Intake 1957.92 ml  Output    700 ml  Net 1257.92 ml   Exam: General: No acute respiratory distress Lungs: Clear to auscultation bilaterally without wheezes or crackles Cardiovascular: Regular rate without murmur gallop or rub Abdomen: overweight, nontender, soft, bowel sounds positive, no rebound, no ascites, no appreciable mass Extremities: No significant cyanosis, clubbing, edema bilateral lower extremities  Data Reviewed: Basic Metabolic Panel:  Recent Labs Lab 09/22/12 0350 09/23/12 0403 09/24/12 0410 09/25/12 0340 09/26/12 0405  NA 129* 132* 132* 132* 134*  K 4.5 4.0 4.2 4.0 4.6  CL 97 100 101 101 101  CO2 24 23 20 24 25   GLUCOSE 94 93 127* 86 120*  BUN 14 11 12 10 10   CREATININE 0.95 0.89 0.92 0.77 0.81  CALCIUM 8.0* 7.8* 7.5* 7.7* 7.8*  MG 2.1 2.0 2.0 1.9 2.0   Liver Function Tests:  Recent Labs Lab 09/22/12 0350 09/23/12 0403 09/24/12 0410 09/25/12 0340 09/26/12 0405  AST 50* 61* 68* 81* 101*  ALT 89* 91* 99* 111* 145*  ALKPHOS 76 76 103 118* 127*  BILITOT 1.1 0.8 0.7 0.7 0.9  PROT 5.2* 5.0* 4.7* 5.2* 4.8*  ALBUMIN 2.2* 2.0* 1.9* 1.9* 1.8*   CBC:  Recent Labs Lab 09/22/12 0350 09/23/12 0403 09/24/12 0410 09/25/12 0340  09/26/12 0405  WBC 9.3 12.2* 12.6* 13.8* 14.2*  NEUTROABS 3.7 2.7 2.8 3.7 3.7  HGB 13.5 12.7* 12.6* 12.9* 12.6*  HCT 38.3* 36.1* 36.2* 36.9* 37.6*  MCV 93.0 93.5 93.5 93.4 94.9  PLT 227 248 260 283 244   Cardiac Enzymes:  Recent Labs Lab 09/19/12 1051 09/19/12 1556 09/19/12 2225 09/20/12 0340 09/20/12 0957 09/20/12 1529  CKTOTAL 111  --   --   --   --   --   CKMB 3.2  --   --   --   --   --   TROPONINI <0.30 <0.30 <0.30 <0.30 <0.30 <0.30     Recent Results (from the past 240 hour(s))  CULTURE, BLOOD (ROUTINE X 2)     Status: None   Collection Time    09/17/12  7:40 PM      Result Value Range Status   Specimen Description BLOOD LEFT ANTECUBITAL   Final   Special Requests BOTTLES DRAWN AEROBIC AND ANAEROBIC 10CC   Final   Culture  Setup Time 09/18/2012 02:01   Final   Culture NO GROWTH 5 DAYS   Final   Report Status 09/24/2012 FINAL   Final  CULTURE, BLOOD (ROUTINE X 2)     Status: None   Collection Time    09/17/12  7:47 PM      Result Value Range Status   Specimen Description BLOOD RIGHT ANTECUBITAL   Final   Special Requests BOTTLES DRAWN AEROBIC AND ANAEROBIC 10CC   Final   Culture  Setup Time 09/18/2012 02:01   Final   Culture NO GROWTH 5 DAYS   Final   Report Status 09/24/2012 FINAL   Final  URINE CULTURE     Status: None   Collection Time    09/17/12  7:50 PM      Result Value Range Status   Specimen Description URINE, RANDOM   Final   Special Requests NONE   Final   Culture  Setup Time 09/17/2012 21:06   Final   Colony Count NO GROWTH   Final   Culture NO GROWTH   Final   Report Status 09/18/2012 FINAL   Final  MRSA PCR SCREENING     Status: None   Collection Time    09/18/12  6:01 PM      Result Value Range Status   MRSA by PCR NEGATIVE  NEGATIVE Final   Comment:            The GeneXpert MRSA Assay (FDA     approved for NASAL specimens     only), is one component of a     comprehensive MRSA colonization     surveillance program. It is not      intended to diagnose MRSA     infection nor to guide or     monitor treatment for     MRSA infections.  CULTURE, BLOOD (ROUTINE X 2)     Status: None   Collection Time    09/19/12 10:40 AM  Result Value Range Status   Specimen Description BLOOD LEFT ARM   Final   Special Requests BOTTLES DRAWN AEROBIC ONLY [facility]   Final   Culture  Setup Time 09/19/2012 20:32   Final   Culture NO GROWTH 5 DAYS   Final   Report Status 09/25/2012 FINAL   Final  CULTURE, BLOOD (ROUTINE X 2)     Status: None   Collection Time    09/19/12 10:45 AM      Result Value Range Status   Specimen Description BLOOD RIGHT ARM   Final   Special Requests BOTTLES DRAWN AEROBIC ONLY 10CC   Final   Culture  Setup Time 09/19/2012 20:32   Final   Culture NO GROWTH 5 DAYS   Final   Report Status 09/25/2012 FINAL   Final  RESPIRATORY VIRUS PANEL     Status: None   Collection Time    09/20/12 10:39 PM      Result Value Range Status   Source - RVPAN NASAL SWAB   Corrected   Comment: CORRECTED ON 07/22 AT 2007: PREVIOUSLY REPORTED AS NASAL SWAB   Respiratory Syncytial Virus A NOT DETECTED   Final   Respiratory Syncytial Virus B NOT DETECTED   Final   Influenza A NOT DETECTED   Final   Influenza B NOT DETECTED   Final   Parainfluenza 1 NOT DETECTED   Final   Parainfluenza 2 NOT DETECTED   Final   Parainfluenza 3 NOT DETECTED   Final   Metapneumovirus NOT DETECTED   Final   Rhinovirus NOT DETECTED   Final   Adenovirus NOT DETECTED   Final   Influenza A H1 NOT DETECTED   Final   Influenza A H3 NOT DETECTED   Final   Comment: (NOTE)           Normal Reference Range for each Analyte: NOT DETECTED     Testing performed using the [employer] xTAG Respiratory Viral Panel test     kit.     This test was developed and its performance characteristics determined     by [employer]. It has not been cleared or approved by the [place]     Food and Drug Administration. This test is used for clinical purposes.     It should not  be regarded as investigational or for research. This     laboratory is certified under the Clinical Laboratory Improvement     Amendments of 1988 (CLIA) as qualified to perform high complexity     clinical laboratory testing.     Studies:  Recent x-ray studies have been reviewed in detail by the Attending Physician  Scheduled Meds:  Scheduled Meds: . apixaban  5 mg Oral BID  . bacitracin   Topical Daily  . diltiazem  30 mg Oral Q6H  . docusate sodium  100 mg Oral BID  . folic acid  1 mg Oral Daily  . metoprolol tartrate  50 mg Oral BID  . omega-3 acid ethyl esters  1 g Oral BID  . polyethylene glycol  17 g Oral BID  . predniSONE  5 mg Oral Q breakfast  . sodium chloride  3 mL Intravenous Q12H    Time spent on care of this patient:   Lonia Blood, MD  [facility] Office  [phone] Pager - Text Page per Loretha Stapler as per below:  On-Call/Text Page:      [email or web address]      password [email or web address]  If 7PM-7AM, please  contact night-coverage [email or web address] Password [email or web address] 09/26/2012, 10:20 AM   LOS: 9 days

## 2012-09-26 NOTE — Evaluation (Signed)
Physical Therapy Evaluation Patient Details Name: William [name] MRN: [id number] DOB: 11/30/1950 Today's Date: 09/26/2012 Time: 9629-5284 William Time Calculation (min): 17 min  William [name] History of Present Illness  William [name] is a 62 y.o. male with known history of polymyalgia rheumatica, CAD status post CABG, hyperlipidemia and hypertension has been experiencing fever and chills over the last one week.  William was admitted and found to have Cytomegalovirus mononucleosis.  William currently with increase HR.  Clinical Impression  William walking in room independently and able to ambulate hallway without assistance.  Educated William and caregiver on the importance of ambulating 3-4x/day with RN staff or family member.  William currently has no William or OT needs.  William will sign off and will inform OT of no need.     William Assessment  Patent does not need any further William services    Follow Up Recommendations  No William follow up    Equipment Recommendations  None recommended by William    Precautions / Restrictions Precautions Precautions: None Restrictions Weight Bearing Restrictions: No   Pertinent Vitals/Pain No c/o pain; HR increased to 140s with ambulation however returned to 100s in supine.        Mobility  Bed Mobility Bed Mobility: Supine to Sit Supine to Sit: 6: Modified independent (Device/Increase time);With rails Transfers Transfers: Sit to Stand;Stand to Sit Sit to Stand: 7: Independent;From bed Stand to Sit: 7: Independent;To bed Ambulation/Gait Ambulation/Gait Assistance: 7: Independent Ambulation Distance (Feet): 250 Feet Assistive device: None Stairs: No    Exercises     William Diagnosis:    William Problem List:   William Treatment Interventions:       William Goals(Current goals can be found in the care plan section)    Visit Information  Last William Received On: 09/26/12 Assistance Needed: +1 History of Present Illness: William [name] is a 62 y.o. male with known  history of polymyalgia rheumatica, CAD status post CABG, hyperlipidemia and hypertension has been experiencing fever and chills over the last one week.  William was admitted and found to have Cytomegalovirus mononucleosis.  William currently with increase HR.       Prior Functioning  Home Living Family/patient expects to be discharged to:: Private residence Living Arrangements: Spouse/significant other Available Help at Discharge: Family Type of Home: House Home Access: Level entry Home Layout: One level Home Equipment: None Prior Function Level of Independence: Independent Communication Communication: No difficulties Dominant Hand: Right    Cognition  Cognition Arousal/Alertness: Awake/alert Behavior During Therapy: WFL for tasks assessed/performed Overall Cognitive Status: Within Functional Limits for tasks assessed    Extremity/Trunk Assessment Lower Extremity Assessment Lower Extremity Assessment: Overall WFL for tasks assessed   Balance    End of Session William - End of Session Equipment Utilized During Treatment: Gait belt Activity Tolerance: Patient tolerated treatment well Patient left: in bed;with call bell/phone within reach;with family/visitor present Nurse Communication: Mobility status  GP     William [name] 09/26/2012, 9:42 AM  William [name], William DPT [phone]

## 2012-09-26 NOTE — Progress Notes (Signed)
Report from Night RN. Chart reviewed together. Handoff complete.Introductions complete. Will continue to monitor and advise attending as needed.   

## 2012-09-27 ENCOUNTER — Encounter (HOSPITAL_COMMUNITY): Payer: Self-pay | Admitting: *Deleted

## 2012-09-27 LAB — COMPREHENSIVE METABOLIC PANEL
AST: 84 U/L — ABNORMAL HIGH (ref 0–37)
Albumin: 1.9 g/dL — ABNORMAL LOW (ref 3.5–5.2)
BUN: 10 mg/dL (ref 6–23)
Creatinine, Ser: 0.84 mg/dL (ref 0.50–1.35)
Total Protein: 5.1 g/dL — ABNORMAL LOW (ref 6.0–8.3)

## 2012-09-27 MED ORDER — DILTIAZEM HCL ER COATED BEADS 120 MG PO CP24
120.0000 mg | ORAL_CAPSULE | Freq: Every day | ORAL | Status: DC
  Administered 2012-09-27: 120 mg via ORAL
  Filled 2012-09-27 (×2): qty 1

## 2012-09-27 MED ORDER — IBUPROFEN 400 MG PO TABS
400.0000 mg | ORAL_TABLET | Freq: Four times a day (QID) | ORAL | Status: DC
  Administered 2012-09-27 – 2012-09-29 (×9): 400 mg via ORAL
  Filled 2012-09-27 (×13): qty 1

## 2012-09-27 MED ORDER — ACETAMINOPHEN 325 MG PO TABS
650.0000 mg | ORAL_TABLET | Freq: Four times a day (QID) | ORAL | Status: DC
  Administered 2012-09-27 – 2012-09-29 (×9): 650 mg via ORAL
  Filled 2012-09-27: qty 3
  Filled 2012-09-27 (×9): qty 2
  Filled 2012-09-27: qty 1

## 2012-09-27 NOTE — Progress Notes (Addendum)
[facility] Progress Note [place] TEAM 1 - Stepdown/ICU William [name] RUE:[id number] DOB: [birth year] DOA: 09/17/2012 PCP: Vivien Presto, MD  Brief narrative: 62 y.o. Male w/ PMHx polymyalgia rheumatica, CAD status post CABG, hyperlipidemia and hypertension who had been experiencing fever and chills over one week. Patient had come to the ER 2 days after his symptoms had started and was placed on doxycycline. Patient was on doxycycline for 2 days after which his fever subsided and he travelled to [place]. There he started spiking fevers again and he had gone to an ER and was placed on Bactrim empirically. As per the family workup done over there was negative for any source. Despite taking Bactrim for 2-3 days patient's fever persisted and therefore he came back to the ER 7/17.   Prior to this episode the patient had been having some joint aches and his rheumatologist was planning to increase his prednisone from 5 mg to 10 which was not done because patient started developing the fever.   In the ER the patient's LFTs were found to be mildly elevated. He denied any insect bites. However patient does camp and fish regularly.  The pt continued to have relapsing fevers during his hospital stay.  He was tx with empiric abx while a source was sought.  ID was consulted.  Ultimately, the pt was found to have CMV mononucleosis.  Additionally, the pt developed tachycardia.  Work up revealed this to be aflutter w/ RVR.  This was felt to be a consequence of his febrile illness.  Cardiology was consulted, and has been attending to this issue.  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 - care as suggested by ID - symptomatic management to continue - no longer needing a cooling blanket for comfort, is using scheduled APAP/NSAID instead - transfer to tele  Polymyalgia rheumatica No evidence of acute flair at this time - holding MTX for now   CAD s/p  CABG Followed by Corinda Gubler - no chest pain at this time   Atrial flutter New since this admission - likely brought about by hypovolemia and acute illness - CCB was d/c by Cardiolgy yesterday, and pt subsequently developed recurrent RVR - I have re-instituted the CCB - will transfer to tele bed and monitor rate - home once rate reliably controlled  Hyperlipidemia Resume home Lipitor once LFTs normalize   HTN  With hydration BP has now returned to baseline   Hyponatremia Most c/w volume depletion (likely insensitive loss due to hectic fevers) - improved w/ IVF resuscitation - follow w/o IVF support for now   Hepatic steatosis (on CT) For long term follow up in outpt setting   Excision site R lateral back Pt recently had a basal cell carcinoma excised from his back - the wound appears to have opened (suture marks surround the margin with no suture in place) - the wound is not draining and does not appear infected - will follow on serial exam  Code Status: FULL Family Communication: spoke w/ pt and wife at bedside Disposition Plan: stable for transfer to tele bed  Consultants: ID Cardiology   Procedures: none  Antibiotics: none  DVT prophylaxis: apixaban  HPI/Subjective: Fevers and chills are currently being well controlled with scheduled doses of APAP and NSAID. Otherwise denies n/v, sob, abdom pain.  Moving his bowels.  Does note HR 140+ even when sitting still.   Objective: Blood pressure 133/83, pulse 94, temperature 98.5 F (36.9 C), temperature source  Oral, resp. rate 18, height 5\' 11"  (1.803 m), weight 116.4 kg (256 lb 9.9 oz), SpO2 95.00%.  Intake/Output Summary (Last 24 hours) at 09/27/12 0847 Last data filed at 09/27/12 0600  Gross per 24 hour  Intake    303 ml  Output   2100 ml  Net  -1797 ml   Exam: General: No acute respiratory distress Lungs: Clear to auscultation bilaterally without wheezes or crackles Cardiovascular: tachycardic at 146 without murmur  gallop or rub Abdomen: overweight, nontender, soft, bowel sounds positive, no rebound, no ascites, no appreciable mass Extremities: No significant cyanosis, clubbing, edema bilateral lower extremities  Data Reviewed: Basic Metabolic Panel:  Recent Labs Lab 09/22/12 0350 09/23/12 0403 09/24/12 0410 09/25/12 0340 09/26/12 0405 09/27/12 0430  NA 129* 132* 132* 132* 134* 133*  K 4.5 4.0 4.2 4.0 4.6 4.6  CL 97 100 101 101 101 99  CO2 24 23 20 24 25 26   GLUCOSE 94 93 127* 86 120* 103*  BUN 14 11 12 10 10 10   CREATININE 0.95 0.89 0.92 0.77 0.81 0.84  CALCIUM 8.0* 7.8* 7.5* 7.7* 7.8* 8.2*  MG 2.1 2.0 2.0 1.9 2.0  --    Liver Function Tests:  Recent Labs Lab 09/23/12 0403 09/24/12 0410 09/25/12 0340 09/26/12 0405 09/27/12 0430  AST 61* 68* 81* 101* 84*  ALT 91* 99* 111* 145* 134*  ALKPHOS 76 103 118* 127* 149*  BILITOT 0.8 0.7 0.7 0.9 0.8  PROT 5.0* 4.7* 5.2* 4.8* 5.1*  ALBUMIN 2.0* 1.9* 1.9* 1.8* 1.9*   CBC:  Recent Labs Lab 09/22/12 0350 09/23/12 0403 09/24/12 0410 09/25/12 0340 09/26/12 0405  WBC 9.3 12.2* 12.6* 13.8* 14.2*  NEUTROABS 3.7 2.7 2.8 3.7 3.7  HGB 13.5 12.7* 12.6* 12.9* 12.6*  HCT 38.3* 36.1* 36.2* 36.9* 37.6*  MCV 93.0 93.5 93.5 93.4 94.9  PLT 227 248 260 283 244   Cardiac Enzymes:  Recent Labs Lab 09/20/12 0957 09/20/12 1529  TROPONINI <0.30 <0.30     Recent Results (from the past 240 hour(s))  CULTURE, BLOOD (ROUTINE X 2)     Status: None   Collection Time    09/17/12  7:40 PM      Result Value Range Status   Specimen Description BLOOD LEFT ANTECUBITAL   Final   Special Requests BOTTLES DRAWN AEROBIC AND ANAEROBIC 10CC   Final   Culture  Setup Time 09/18/2012 02:01   Final   Culture NO GROWTH 5 DAYS   Final   Report Status 09/24/2012 FINAL   Final  CULTURE, BLOOD (ROUTINE X 2)     Status: None   Collection Time    09/17/12  7:47 PM      Result Value Range Status   Specimen Description BLOOD RIGHT ANTECUBITAL   Final   Special  Requests BOTTLES DRAWN AEROBIC AND ANAEROBIC 10CC   Final   Culture  Setup Time 09/18/2012 02:01   Final   Culture NO GROWTH 5 DAYS   Final   Report Status 09/24/2012 FINAL   Final  URINE CULTURE     Status: None   Collection Time    09/17/12  7:50 PM      Result Value Range Status   Specimen Description URINE, RANDOM   Final   Special Requests NONE   Final   Culture  Setup Time 09/17/2012 21:06   Final   Colony Count NO GROWTH   Final   Culture NO GROWTH   Final   Report Status 09/18/2012 FINAL  Final  MRSA PCR SCREENING     Status: None   Collection Time    09/18/12  6:01 PM      Result Value Range Status   MRSA by PCR NEGATIVE  NEGATIVE Final   Comment:            The GeneXpert MRSA Assay (FDA     approved for NASAL specimens     only), is one component of a     comprehensive MRSA colonization     surveillance program. It is not     intended to diagnose MRSA     infection nor to guide or     monitor treatment for     MRSA infections.  CULTURE, BLOOD (ROUTINE X 2)     Status: None   Collection Time    09/19/12 10:40 AM      Result Value Range Status   Specimen Description BLOOD LEFT ARM   Final   Special Requests BOTTLES DRAWN AEROBIC ONLY [facility]   Final   Culture  Setup Time 09/19/2012 20:32   Final   Culture NO GROWTH 5 DAYS   Final   Report Status 09/25/2012 FINAL   Final  CULTURE, BLOOD (ROUTINE X 2)     Status: None   Collection Time    09/19/12 10:45 AM      Result Value Range Status   Specimen Description BLOOD RIGHT ARM   Final   Special Requests BOTTLES DRAWN AEROBIC ONLY 10CC   Final   Culture  Setup Time 09/19/2012 20:32   Final   Culture NO GROWTH 5 DAYS   Final   Report Status 09/25/2012 FINAL   Final  RESPIRATORY VIRUS PANEL     Status: None   Collection Time    09/20/12 10:39 PM      Result Value Range Status   Source - RVPAN NASAL SWAB   Corrected   Comment: CORRECTED ON 07/22 AT 2007: PREVIOUSLY REPORTED AS NASAL SWAB   Respiratory Syncytial Virus  A NOT DETECTED   Final   Respiratory Syncytial Virus B NOT DETECTED   Final   Influenza A NOT DETECTED   Final   Influenza B NOT DETECTED   Final   Parainfluenza 1 NOT DETECTED   Final   Parainfluenza 2 NOT DETECTED   Final   Parainfluenza 3 NOT DETECTED   Final   Metapneumovirus NOT DETECTED   Final   Rhinovirus NOT DETECTED   Final   Adenovirus NOT DETECTED   Final   Influenza A H1 NOT DETECTED   Final   Influenza A H3 NOT DETECTED   Final   Comment: (NOTE)           Normal Reference Range for each Analyte: NOT DETECTED     Testing performed using the Lum[employer] xTAG Respiratory Viral Panel test     kit.     This test was developed and its performance characteristics determined     by [employer]. It has not been cleared or approved by the [place]     Food and Drug Administration. This test is used for clinical purposes.     It should not be regarded as investigational or for research. This     laboratory is certified under the Clinical Laboratory Improvement     Amendments of 1988 (CLIA) as qualified to perform high complexity     clinical laboratory testing.     Studies:  Recent x-ray studies have been reviewed  in detail by the Attending Physician  Scheduled Meds:  Scheduled Meds: . apixaban  5 mg Oral BID  . bacitracin   Topical Daily  . diltiazem  120 mg Oral Daily  . docusate sodium  100 mg Oral BID  . folic acid  1 mg Oral Daily  . metoprolol tartrate  100 mg Oral BID  . omega-3 acid ethyl esters  1 g Oral BID  . polyethylene glycol  17 g Oral BID  . predniSONE  5 mg Oral Q breakfast  . sodium chloride  3 mL Intravenous Q12H    Time spent on care of this patient:   Lonia Blood, MD  [facility] Office  [phone] Pager - Text Page per Loretha Stapler as per below:  On-Call/Text Page:      [email or web address]      password [email or web address]  If 7PM-7AM, please contact night-coverage [email or web address] Password [facility] 09/27/2012, 8:47 AM   LOS: 10 days

## 2012-09-27 NOTE — Progress Notes (Signed)
Pt complained of feeling warm and sweaty. Checked pt's temp and it was 100.2 Pt is taking scheduled Tylenol and received dose recently and has scheduled Ibuprofen due in 2 hours.

## 2012-09-27 NOTE — Progress Notes (Signed)
Patient ID: William [name], male   DOB: 01-03-1951, 62 y.o.   MRN: [id number]         [facility] for Infectious Disease    Date of Admission:  09/17/2012     Principal Problem:   Cytomegalovirus mononucleosis Active Problems:   Polymyalgia rheumatica   CAD (coronary artery disease)   Hyperlipidemia   HTN (hypertension)   Atrial flutter   Elevated liver enzymes   . acetaminophen  650 mg Oral Q6H  . apixaban  5 mg Oral BID  . bacitracin   Topical Daily  . diltiazem  120 mg Oral Daily  . docusate sodium  100 mg Oral BID  . folic acid  1 mg Oral Daily  . ibuprofen  400 mg Oral Q6H  . metoprolol tartrate  100 mg Oral BID  . omega-3 acid ethyl esters  1 g Oral BID  . polyethylene glycol  17 g Oral BID  . predniSONE  5 mg Oral Q breakfast  . sodium chloride  3 mL Intravenous Q12H    Subjective: He is finally feeling better. He has not had any recent fever, chills or sweats.  Objective: Temp:  [98.1 F (36.7 C)-98.5 F (36.9 C)] 98.5 F (36.9 C) (07/27 1113) Pulse Rate:  [70-145] 70 (07/27 1113) Resp:  [17-18] 18 (07/27 1113) BP: (107-162)/(74-105) 107/74 mmHg (07/27 1113) SpO2:  [92 %-96 %] 96 % (07/27 1113)  General: He is looking better Skin: No rash Lungs: Clear Cor: Regular S1 and S2 no murmurs Abdomen: Obese, soft and nontender Joints and extremities: No acute abnormalities  Lab Results Lab Results  Component Value Date   WBC 14.2* 09/26/2012   HGB 12.6* 09/26/2012   HCT 37.6* 09/26/2012   MCV 94.9 09/26/2012   PLT 244 09/26/2012    Lab Results  Component Value Date   CREATININE 0.84 09/27/2012   BUN 10 09/27/2012   NA 133* 09/27/2012   K 4.6 09/27/2012   CL 99 09/27/2012   CO2 26 09/27/2012    Lab Results  Component Value Date   ALT 134* 09/27/2012   AST 84* 09/27/2012   ALKPHOS 149* 09/27/2012   BILITOT 0.8 09/27/2012    Assessment: He has not had any recent documented fevers and I believe that his cytomegalovirus mononucleosis is finally  subsiding.  Plan: 1. Continue observation off of antibiotics 2. Heart rate control 3. Hopefully home soon  Cliffton Asters, MD [facility] for Infectious Disease [facility] [phone] pager   [phone] cell 09/27/2012, 1:53 PM

## 2012-09-27 NOTE — Progress Notes (Signed)
Subjective:  No chest pain or dyspnea. Pulse too fast without diltiazem so diltiazem has been restarted. LFTs stable.  Objective:  Vital Signs in the last 24 hours: Temp:  [98.1 F (36.7 C)-98.5 F (36.9 C)] 98.5 F (36.9 C) (07/27 1113) Pulse Rate:  [70-145] 70 (07/27 1113) Resp:  [15-18] 18 (07/27 1113) BP: (107-162)/(74-105) 107/74 mmHg (07/27 1113) SpO2:  [92 %-96 %] 96 % (07/27 1113)  Intake/Output from previous day: 07/26 0701 - 07/27 0700 In: 303 [I.V.:303] Out: 2100 [Urine:2100] Intake/Output from this shift:    . acetaminophen  650 mg Oral Q6H  . apixaban  5 mg Oral BID  . bacitracin   Topical Daily  . diltiazem  120 mg Oral Daily  . docusate sodium  100 mg Oral BID  . folic acid  1 mg Oral Daily  . ibuprofen  400 mg Oral Q6H  . metoprolol tartrate  100 mg Oral BID  . omega-3 acid ethyl esters  1 g Oral BID  . polyethylene glycol  17 g Oral BID  . predniSONE  5 mg Oral Q breakfast  . sodium chloride  3 mL Intravenous Q12H      Physical Exam: The patient appears to be in no distress.  Head and neck exam reveals that the pupils are equal and reactive.  The extraocular movements are full.  There is no scleral icterus.  Mouth and pharynx are benign.  No lymphadenopathy.  No carotid bruits.  The jugular venous pressure is normal.  Thyroid is not enlarged or tender.  Chest is clear to percussion and auscultation.  No rales or rhonchi.  Expansion of the chest is symmetrical.  Heart reveals no abnormal lift or heave.  First and second heart sounds are normal.  There is no murmur gallop rub or click. Pulse atrial flutter with 4:1 block.  The abdomen is soft and nontender.  Bowel sounds are normoactive.    Extremities reveal no phlebitis or edema.  Pedal pulses are good.  There is no cyanosis or clubbing.  Neurologic exam is normal strength and no lateralizing weakness.  No sensory deficits.  Integument reveals no rash  Lab Results:  Recent Labs  09/25/12 0340 09/26/12 0405  WBC 13.8* 14.2*  HGB 12.9* 12.6*  PLT 283 244    Recent Labs  09/26/12 0405 09/27/12 0430  NA 134* 133*  K 4.6 4.6  CL 101 99  CO2 25 26  GLUCOSE 120* 103*  BUN 10 10  CREATININE 0.81 0.84   No results found for this basename: TROPONINI, CK, MB,  in the last 72 hours Hepatic Function Panel  Recent Labs  09/27/12 0430  PROT 5.1*  ALBUMIN 1.9*  AST 84*  ALT 134*  ALKPHOS 149*  BILITOT 0.8   No results found for this basename: CHOL,  in the last 72 hours No results found for this basename: PROTIME,  in the last 72 hours  Imaging: No results found.  Cardiac Studies:  Study Conclusions  - Left ventricle: The cavity size was normal. Systolic function was normal. The estimated ejection fraction was in the range of 55% to 60%. Wall motion was normal; there were no regional wall motion abnormalities. - Aortic valve: Calcified non coronary cusp - Mitral valve: Calcified annulus. - Atrial septum: Poorly visualized seems lipomatous and mobile but no obvious PFO  Assessment/Plan:  1. Atrial fib/flutter with RVR. Heart rate better controlled on higher dose of metoprolol and on diltiazem. Continue Eliquis for  anticoagulation. ASA discontinued.  For now will treat with rate control and anticoagulation. Once febrile illness resolved will consider DCCV in 4 weeks if still out of rhythm.  2. CAD s/p CABG 2001. Myoview 3/12 showed small inferobasal scar with mild peri-infarct ischemia. Continue medical management.  3. CMV infection.  4. HL  5. PMR  Plan: continue rate control and eliquis.  Continue cardizem CD 120 mg daily and metoprolol 100 mg BID for rate control.      LOS: 10 days    Cassell Clement 09/27/2012, 11:31 AM

## 2012-09-27 NOTE — Progress Notes (Signed)
Report from Night RN. Chart reviewed together. Handoff complete.Introductions complete. Will continue to monitor and advise attending as needed.   

## 2012-09-28 MED ORDER — PANTOPRAZOLE SODIUM 40 MG PO TBEC
40.0000 mg | DELAYED_RELEASE_TABLET | Freq: Every day | ORAL | Status: DC
  Administered 2012-09-28 – 2012-09-29 (×2): 40 mg via ORAL
  Filled 2012-09-28 (×2): qty 1

## 2012-09-28 MED ORDER — DILTIAZEM HCL ER COATED BEADS 240 MG PO CP24
240.0000 mg | ORAL_CAPSULE | Freq: Every day | ORAL | Status: DC
  Administered 2012-09-28 – 2012-09-29 (×2): 240 mg via ORAL
  Filled 2012-09-28 (×2): qty 1

## 2012-09-28 NOTE — Progress Notes (Signed)
TELEMETRY: Reviewed telemetry pt in atrial flutter rate fluctuating: Filed Vitals:   09/27/12 1113 09/27/12 2002 09/27/12 2346 09/28/12 0431  BP: 107/74 121/68  117/81  Pulse: 70 74  88  Temp: 98.5 F (36.9 C) 98.8 F (37.1 C) 100.2 F (37.9 C) 97.7 F (36.5 C)  TempSrc: Oral Oral Oral Oral  Resp: 18 19  17   Height:      Weight:      SpO2: 96% 96%  93%    Intake/Output Summary (Last 24 hours) at 09/28/12 0736 Last data filed at 09/27/12 1737  Gross per 24 hour  Intake    240 ml  Output      0 ml  Net    240 ml    SUBJECTIVE Fever curve improved. No chest pain, SOB, or palpitations.  LABS: Basic Metabolic Panel:  Recent Labs  16/10/96 0405 09/27/12 0430  NA 134* 133*  K 4.6 4.6  CL 101 99  CO2 25 26  GLUCOSE 120* 103*  BUN 10 10  CREATININE 0.81 0.84  CALCIUM 7.8* 8.2*  MG 2.0  --    Liver Function Tests:  Recent Labs  09/26/12 0405 09/27/12 0430  AST 101* 84*  ALT 145* 134*  ALKPHOS 127* 149*  BILITOT 0.9 0.8  PROT 4.8* 5.1*  ALBUMIN 1.8* 1.9*   CBC:  Recent Labs  09/26/12 0405  WBC 14.2*  NEUTROABS 3.7  HGB 12.6*  HCT 37.6*  MCV 94.9  PLT 244    Radiology/Studies:   Dg Chest 2 View  09/17/2012   *RADIOLOGY REPORT*  Clinical Data: Fever for 8 days.  History of smoking.  CHEST - 2 VIEW  Comparison: Chest radiograph performed 09/12/2012.  Findings: The lungs are well-aerated and clear.  There is no evidence of focal opacification, pleural effusion or pneumothorax.  The heart is normal in size; the patient is status post median sternotomy, with evidence of prior CABG.  No acute osseous abnormalities are seen.  IMPRESSION: No acute cardiopulmonary process seen.   Original Report Authenticated By: Tonia Ghent, M.D.      Ct Chest Wo Contrast  09/18/2012   *RADIOLOGY REPORT*  Clinical Data: Fever, elevated liver function tests  CT CHEST WITHOUT CONTRAST  Technique:  Multidetector CT imaging of the chest was performed following the  standard protocol without IV contrast.  Comparison: CT abdomen/pelvis same date, chest CT 06/14/2012  Findings: Evidence of CABG noted.  Moderate atheromatous aortic calcification noted without aneurysm.  Heart size is normal.  No pericardial or pleural effusion.  Incomplete imaging of the upper abdomen redemonstrates hepatic hypodensity compatible with steatosis.  Curvilinear left lower lobe scarring is re-identified.  Trace pleural fluid or thickening is noted.  The lungs are otherwise clear.  Central airways are patent.  Subacute left posterior seventh and eighth rib fractures are noted.  IMPRESSION: No acute cardiopulmonary process.  Hepatic steatosis is redemonstrated, which may account for the history of elevated liver function tests.   Original Report Authenticated By: Christiana Pellant, M.D.   Ecg: Atrial flutter with RVR. Nonspecific ST abnormality.  Echo:Study Conclusions  - Left ventricle: The cavity size was normal. Systolic function was normal. The estimated ejection fraction was in the range of 55% to 60%. Wall motion was normal; there were no regional wall motion abnormalities. - Aortic valve: Calcified non coronary cusp - Mitral valve: Calcified annulus. - Atrial septum: Poorly visualized seems lipomatous and mobile but no obvious PFO   PHYSICAL EXAM General: Well developed, obese,  in no acute distress. Head: Normocephalic, atraumatic, sclera non-icteric, no xanthomas, nares are without discharge. Neck: Negative for carotid bruits. JVD not elevated. Lungs: Clear bilaterally to auscultation without wheezes, rales, or rhonchi. Breathing is unlabored. Heart: IRRR, tachy, S1 S2 without murmurs, rubs, or gallops.  Abdomen: Soft, non-tender, non-distended with normoactive bowel sounds. No hepatomegaly. No rebound/guarding. No obvious abdominal masses. Msk:  Strength and tone appears normal for age. Extremities: No clubbing, cyanosis or edema.  Distal pedal pulses are 2+ and equal  bilaterally. Neuro: Alert and oriented X 3. Moves all extremities spontaneously. Psych:  Responds to questions appropriately with a normal affect.  ASSESSMENT AND PLAN: 1. Atrial flutter with RVR. Heart rate control improved but not quite optimal. Will increase cardizem CD to 240 mg daily. On metoprolol 100 mg bid.  Continue Eliquis for anticoagulation. ASA discontinued.  For now will treat with rate control and anticoagulation. Once febrile illness resolved will consider DCCV in 4 weeks if still out of rhythm. 2. CAD s/p CABG 2001. Myoview 3/12 showed small inferobasal scar with mild peri-infarct ischemia. Continue medical management. 3. CMV infection. 4. HL 5. PMR 6. Elevated LFTs- May be related to diltiazem but more likely due to infection or NSAIDs.  7. GI prophylaxis. Patient on chronic steroids and now on around the clock Ibuprofen. Since he is now on Eliquis he needs GI prophylaxis. Will start Protonix 40 mg daily.   Patient is stable for DC from a cardiac standpoint. Will need follow up in our office in 1-2 weeks.  Principal Problem:   Cytomegalovirus mononucleosis Active Problems:   Polymyalgia rheumatica   CAD (coronary artery disease)   Hyperlipidemia   HTN (hypertension)   Atrial flutter   Elevated liver enzymes    Signed, Tayona Sarnowski [place] MD 09/28/2012 7:42 AM

## 2012-09-28 NOTE — Progress Notes (Signed)
Patient ID: William [name], male   DOB: 14-Mar-1950, 62 y.o.   MRN: [id number]         [facility]    Date of Admission:  09/17/2012     Principal Problem:   Cytomegalovirus mononucleosis Active Problems:   Polymyalgia rheumatica   CAD (coronary artery disease)   Hyperlipidemia   HTN (hypertension)   Atrial flutter   Elevated liver enzymes   . acetaminophen  650 mg Oral Q6H  . apixaban  5 mg Oral BID  . bacitracin   Topical Daily  . diltiazem  240 mg Oral Daily  . docusate sodium  100 mg Oral BID  . folic acid  1 mg Oral Daily  . ibuprofen  400 mg Oral Q6H  . metoprolol tartrate  100 mg Oral BID  . omega-3 acid ethyl esters  1 g Oral BID  . pantoprazole  40 mg Oral Daily  . polyethylene glycol  17 g Oral BID  . predniSONE  5 mg Oral Q breakfast  . sodium chloride  3 mL Intravenous Q12H    Subjective: He is feeling much better. He has been walking around the unit and his heart rate is under better control. He had only one very mild sweat overnight but none of the severe fever, chills or sweats that he had been having previously.  Objective: Temp:  [97.7 F (36.5 C)-100.2 F (37.9 C)] 97.9 F (36.6 C) (07/28 1436) Pulse Rate:  [70-88] 70 (07/28 1436) Resp:  [17-19] 18 (07/28 1436) BP: (117-121)/(68-81) 118/72 mmHg (07/28 1436) SpO2:  [93 %-97 %] 97 % (07/28 1436)  General: He is looking much better. He is alert and smiling and entertaining friends  Lab Results Lab Results  Component Value Date   WBC 14.2* 09/26/2012   HGB 12.6* 09/26/2012   HCT 37.6* 09/26/2012   MCV 94.9 09/26/2012   PLT 244 09/26/2012    Lab Results  Component Value Date   CREATININE 0.84 09/27/2012   BUN 10 09/27/2012   NA 133* 09/27/2012   K 4.6 09/27/2012   CL 99 09/27/2012   CO2 26 09/27/2012    Lab Results  Component Value Date   ALT 134* 09/27/2012   AST 84* 09/27/2012   ALKPHOS 149* 09/27/2012   BILITOT 0.8 09/27/2012    Assessment: His acute CMV  mononucleosis is finally resolving. His liver enzymes will return to normal with time and I do not feel that he needs daily labs. He does not need any specific followup and should not have any long-term consequences of his recent illness. He has my office phone number and knows to call if he has any questions after discharge.  Plan: 1. Resume activities as tolerated 2. I will sign off now  Cliffton Asters, MD [facility] for Infectious Disease [facility] [phone] pager   [phone] cell 09/28/2012, 5:05 PM

## 2012-09-28 NOTE — Progress Notes (Signed)
OT Cancellation Note  Patient Details Name: ORBIN MAYEUX MRN: [id number] DOB: [birth year]   Cancelled Treatment:    Reason Eval/Treat Not Completed: OT screened, no needs identified, will sign off  [facility], OTR/L  [phone] 09/28/2012 09/28/2012, 2:18 PM

## 2012-09-28 NOTE — Progress Notes (Signed)
Pt up to ambulate in hall. HR up to 150's. Patient back in room and HR down to 90's. Will continue to monitor.

## 2012-09-28 NOTE — Progress Notes (Signed)
[facility] Progress Note [place] TEAM 1 - Stepdown/ICU MATHEW POSTIGLIONE ZOX:[id number] DOB: [birth year] DOA: 09/17/2012 PCP: Vivien Presto, MD  Brief narrative: 62 y.o. Male w/ PMHx polymyalgia rheumatica, CAD status post CABG, hyperlipidemia and hypertension who had been experiencing fever and chills over one week. Patient had come to the ER 2 days after his symptoms had started and was placed on doxycycline. Patient was on doxycycline for 2 days after which his fever subsided and he travelled to [place]. There he started spiking fevers again and he had gone to an ER and was placed on Bactrim empirically. As per the family workup done over there was negative for any source. Despite taking Bactrim for 2-3 days patient's fever persisted and therefore he came back to the ER 7/17.   Prior to this episode the patient had been having some joint aches and his rheumatologist was planning to increase his prednisone from 5 mg to 10 which was not done because patient started developing the fever.   In the ER the patient's LFTs were found to be mildly elevated. He denied any insect bites. However patient does camp and fish regularly.  The pt continued to have relapsing fevers during his hospital stay.  He was tx with empiric abx while a source was sought.  ID was consulted.  Ultimately, the pt was found to have CMV mononucleosis.  Additionally, the pt developed tachycardia.  Work up revealed this to be aflutter w/ RVR.  This was felt to be a consequence of his febrile illness.  Cardiology was consulted, and has been attending to this issue.  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 - care as suggested by ID - symptomatic management to continue - no longer needing a cooling blanket for comfort, is using scheduled APAP/NSAID instead  Polymyalgia rheumatica No evidence of acute flair at this time - holding MTX for now   CAD s/p CABG Followed by  Corinda Gubler - no chest pain at this time   Atrial flutter New since this admission - likely brought about by hypovolemia and acute illness - CCB was d/c by Cardiolgy 7/26, and pt subsequently developed recurrent RVR - I re-instituted the CCB, and Cardiolgoy has increased its dose today - if rate remains controlled over night, will plan for d/c home 7/29  Hyperlipidemia Resume home Lipitor as outpt once LFTs normalize   HTN  With hydration BP has now returned to baseline   Hyponatremia Most c/w volume depletion (likely insensitive loss due to hectic fevers) - improved w/ IVF resuscitation - follow w/o IVF support for now   Hepatic steatosis (on CT) For long term follow up in outpt setting   Excision site R lateral back Pt recently had a basal cell carcinoma excised from his back - the wound appears to have opened (suture marks surround the margin with no suture in place) - the wound is not draining and does not appear infected - follow on serial exam  Code Status: FULL Family Communication: spoke w/ pt and wife at bedside Disposition Plan: probable d/c home 7/29  Consultants: ID Cardiology   Procedures: none  Antibiotics: none  DVT prophylaxis: apixaban  HPI/Subjective: Fevers and chills are currently being well controlled with scheduled doses of APAP and NSAID. Denies n/v, sob, abdom pain.  Moving his bowels.  Is very nervous about planned d/c home 7/29.   Objective: Blood pressure 118/72, pulse 70, temperature 97.9 F (36.6 C), temperature source Oral, resp.  rate 18, height 5\' 11"  (1.803 m), weight 116.4 kg (256 lb 9.9 oz), SpO2 97.00%.  Intake/Output Summary (Last 24 hours) at 09/28/12 1712 Last data filed at 09/28/12 0900  Gross per 24 hour  Intake    480 ml  Output      0 ml  Net    480 ml   Exam: General: No acute respiratory distress at rest  Lungs: Clear to auscultation bilaterally without wheezes or crackles Cardiovascular: regular rate at present w/o gallup  or rub  Abdomen: overweight, nontender, soft, bowel sounds positive, no rebound, no ascites, no appreciable mass Extremities: No significant cyanosis, clubbing, edema bilateral lower extremities  Data Reviewed: Basic Metabolic Panel:  Recent Labs Lab 09/22/12 0350 09/23/12 0403 09/24/12 0410 09/25/12 0340 09/26/12 0405 09/27/12 0430  NA 129* 132* 132* 132* 134* 133*  K 4.5 4.0 4.2 4.0 4.6 4.6  CL 97 100 101 101 101 99  CO2 24 23 20 24 25 26   GLUCOSE 94 93 127* 86 120* 103*  BUN 14 11 12 10 10 10   CREATININE 0.95 0.89 0.92 0.77 0.81 0.84  CALCIUM 8.0* 7.8* 7.5* 7.7* 7.8* 8.2*  MG 2.1 2.0 2.0 1.9 2.0  --    Liver Function Tests:  Recent Labs Lab 09/23/12 0403 09/24/12 0410 09/25/12 0340 09/26/12 0405 09/27/12 0430  AST 61* 68* 81* 101* 84*  ALT 91* 99* 111* 145* 134*  ALKPHOS 76 103 118* 127* 149*  BILITOT 0.8 0.7 0.7 0.9 0.8  PROT 5.0* 4.7* 5.2* 4.8* 5.1*  ALBUMIN 2.0* 1.9* 1.9* 1.8* 1.9*   CBC:  Recent Labs Lab 09/22/12 0350 09/23/12 0403 09/24/12 0410 09/25/12 0340 09/26/12 0405  WBC 9.3 12.2* 12.6* 13.8* 14.2*  NEUTROABS 3.7 2.7 2.8 3.7 3.7  HGB 13.5 12.7* 12.6* 12.9* 12.6*  HCT 38.3* 36.1* 36.2* 36.9* 37.6*  MCV 93.0 93.5 93.5 93.4 94.9  PLT 227 248 260 283 244    Recent Results (from the past 240 hour(s))  MRSA PCR SCREENING     Status: None   Collection Time    09/18/12  6:01 PM      Result Value Range Status   MRSA by PCR NEGATIVE  NEGATIVE Final   Comment:            The GeneXpert MRSA Assay (FDA     approved for NASAL specimens     only), is one component of a     comprehensive MRSA colonization     surveillance program. It is not     intended to diagnose MRSA     infection nor to guide or     monitor treatment for     MRSA infections.  CULTURE, BLOOD (ROUTINE X 2)     Status: None   Collection Time    09/19/12 10:40 AM      Result Value Range Status   Specimen Description BLOOD LEFT ARM   Final   Special Requests BOTTLES DRAWN  AEROBIC ONLY [facility]   Final   Culture  Setup Time 09/19/2012 20:32   Final   Culture NO GROWTH 5 DAYS   Final   Report Status 09/25/2012 FINAL   Final  CULTURE, BLOOD (ROUTINE X 2)     Status: None   Collection Time    09/19/12 10:45 AM      Result Value Range Status   Specimen Description BLOOD RIGHT ARM   Final   Special Requests BOTTLES DRAWN AEROBIC ONLY 10CC   Final  Culture  Setup Time 09/19/2012 20:32   Final   Culture NO GROWTH 5 DAYS   Final   Report Status 09/25/2012 FINAL   Final  RESPIRATORY VIRUS PANEL     Status: None   Collection Time    09/20/12 10:39 PM      Result Value Range Status   Source - RVPAN NASAL SWAB   Corrected   Comment: CORRECTED ON 07/22 AT 2007: PREVIOUSLY REPORTED AS NASAL SWAB   Respiratory Syncytial Virus A NOT DETECTED   Final   Respiratory Syncytial Virus B NOT DETECTED   Final   Influenza A NOT DETECTED   Final   Influenza B NOT DETECTED   Final   Parainfluenza 1 NOT DETECTED   Final   Parainfluenza 2 NOT DETECTED   Final   Parainfluenza 3 NOT DETECTED   Final   Metapneumovirus NOT DETECTED   Final   Rhinovirus NOT DETECTED   Final   Adenovirus NOT DETECTED   Final   Influenza A H1 NOT DETECTED   Final   Influenza A H3 NOT DETECTED   Final   Comment: (NOTE)           Normal Reference Range for each Analyte: NOT DETECTED     Testing performed using the Lum[employer] xTAG Respiratory Viral Panel test     kit.     This test was developed and its performance characteristics determined     by [employer]. It has not been cleared or approved by the [place]     Food and Drug Administration. This test is used for clinical purposes.     It should not be regarded as investigational or for research. This     laboratory is certified under the Clinical Laboratory Improvement     Amendments of 1988 (CLIA) as qualified to perform high complexity     clinical laboratory testing.     Studies:  Recent x-ray studies have been reviewed in detail by the  Attending Physician  Scheduled Meds:  Scheduled Meds: . acetaminophen  650 mg Oral Q6H  . apixaban  5 mg Oral BID  . bacitracin   Topical Daily  . diltiazem  240 mg Oral Daily  . docusate sodium  100 mg Oral BID  . folic acid  1 mg Oral Daily  . ibuprofen  400 mg Oral Q6H  . metoprolol tartrate  100 mg Oral BID  . omega-3 acid ethyl esters  1 g Oral BID  . pantoprazole  40 mg Oral Daily  . polyethylene glycol  17 g Oral BID  . predniSONE  5 mg Oral Q breakfast  . sodium chloride  3 mL Intravenous Q12H    Time spent on care of this patient:   Lonia Blood, MD  [facility] Office  [phone] Pager - Text Page per Loretha Stapler as per below:  On-Call/Text Page:      [email or web address]      password [email or web address]  If 7PM-7AM, please contact night-coverage [email or web address] Password [email or web address] 09/28/2012, 5:12 PM   LOS: 11 days

## 2012-09-29 LAB — CBC
HCT: 40 % (ref 39.0–52.0)
Hemoglobin: 13 g/dL (ref 13.0–17.0)
MCV: 96.6 fL (ref 78.0–100.0)
Platelets: 246 10*3/uL (ref 150–400)
RBC: 4.14 MIL/uL — ABNORMAL LOW (ref 4.22–5.81)
WBC: 19.8 10*3/uL — ABNORMAL HIGH (ref 4.0–10.5)

## 2012-09-29 LAB — BASIC METABOLIC PANEL
BUN: 14 mg/dL (ref 6–23)
CO2: 28 mEq/L (ref 19–32)
Chloride: 101 mEq/L (ref 96–112)
Creatinine, Ser: 0.89 mg/dL (ref 0.50–1.35)
Glucose, Bld: 92 mg/dL (ref 70–99)

## 2012-09-29 MED ORDER — ACETAMINOPHEN 325 MG PO TABS: 650.0000 mg | ORAL_TABLET | Freq: Four times a day (QID) | ORAL | Status: DC

## 2012-09-29 MED ORDER — IBUPROFEN 400 MG PO TABS: 400.0000 mg | ORAL_TABLET | Freq: Four times a day (QID) | ORAL | Status: DC

## 2012-09-29 MED ORDER — METOPROLOL TARTRATE 100 MG PO TABS: 100.0000 mg | ORAL_TABLET | Freq: Two times a day (BID) | ORAL | Status: DC

## 2012-09-29 MED ORDER — APIXABAN 5 MG PO TABS: 5.0000 mg | ORAL_TABLET | Freq: Two times a day (BID) | ORAL | Status: DC

## 2012-09-29 MED ORDER — PREDNISONE 5 MG PO TABS: 5.0000 mg | ORAL_TABLET | Freq: Every day | ORAL | Status: DC

## 2012-09-29 MED ORDER — DILTIAZEM HCL ER COATED BEADS 240 MG PO CP24: 240.0000 mg | ORAL_CAPSULE | Freq: Every day | ORAL | Status: DC

## 2012-09-29 MED ORDER — PANTOPRAZOLE SODIUM 40 MG PO TBEC: 40.0000 mg | DELAYED_RELEASE_TABLET | Freq: Every day | ORAL | Status: DC

## 2012-09-29 NOTE — Discharge Summary (Signed)
Physician Discharge Summary  William [name] WUJ:[id number] DOB: 1950/10/30 DOA: 09/17/2012  PCP: Vivien Presto, MD  Admit date: 09/17/2012 Discharge date: 09/29/2012  Time spent: >45 minutes  Recommendations for Outpatient Follow-up:  1. F/u Hepatic panel in 1-2 weeks and resume Lipitor  Discharge Diagnoses:  Principal Problem:   Cytomegalovirus mononucleosis Active Problems:   Polymyalgia rheumatica   CAD (coronary artery disease)   Hyperlipidemia   HTN (hypertension)   Atrial flutter   Elevated liver enzymes   Discharge Condition: stable  Diet recommendation: heart healthy  Filed Weights   09/21/12 0504 09/23/12 0614 09/24/12 0500  Weight: 112.5 kg (248 lb 0.3 oz) 111.4 kg (245 lb 9.5 oz) 116.4 kg (256 lb 9.9 oz)    History of present illness:  62 y.o. Male w/ PMHx polymyalgia rheumatica, CAD status post CABG, hyperlipidemia and hypertension who had been experiencing fever and chills over one week. Patient had come to the ER 2 days after his symptoms had started and was placed on doxycycline. Patient was on doxycycline for 2 days after which his fever subsided and he travelled to [place]. There he started spiking fevers again and he had gone to an ER and was placed on Bactrim empirically. As per the family workup done over there was negative for any source. Despite taking Bactrim for 2-3 days patient's fever persisted and therefore he came back to the ER 7/17.  Prior to this episode the patient had been having some joint aches and his rheumatologist was planning to increase his prednisone from 5 mg to 10 which was not done because patient started developing the fever.  In the ER the patient's LFTs were found to be mildly elevated. He denied any insect bites. However patient does camp and fish regularly.  The pt continued to have relapsing fevers during his hospital stay. He was tx with empiric abx while a source was sought. ID was consulted. Ultimately, the pt was found to  have CMV mononucleosis. Additionally, the pt developed tachycardia. Work up revealed this to be aflutter w/ RVR. This was felt to be a consequence of his febrile illness. Cardiology was consulted, and has been attending to this issue.   Hospital Course:  Acute CMV mononucleosis  Positive for IgM/IgG CMV antibodies - care as suggested by ID - symptomatic management to continue - no longer needing a cooling blanket for comfort, is using scheduled APAP/NSAID instead - He his instructed by ID to continue to take these medications as long as he is having fevers- Per ID, OK to d/c home despite increase in WBC count today as no new symptoms are present  Polymyalgia rheumatica  No evidence of acute flair at this time - holding MTX for now - can resume after discharge  CAD s/p CABG  Followed by Corinda Gubler - no chest pain at this time   Atrial flutter  New since this admission - likely brought about by hypovolemia and acute illness - He was on Norvasc at home but was started on Cardizem in the hospital which controlled the rate- This was d/c by Cardiolgy 7/26, and pt subsequently developed recurrent RVR -Cardizem was then resumed and dose increased to control rate- Pts Metoprolol has also been increased and new prescriptions given- He has been started on Eliquis by cardiology- ASA has been discontinued.   Hyperlipidemia  Resume home Lipitor when LFTs return to baseline  HTN  With hydration BP has now returned to baseline   Hyponatremia  Most c/w volume depletion (likely  insensitive loss due to hectic fevers) - improved w/ IVF resuscitation -   Hepatic steatosis (on CT)  For long term follow up in outpt setting   Excision site R lateral back  Pt recently had a basal cell carcinoma excised from his back - the wound appears to have opened (suture marks surround the margin with no suture in place) - the wound is not draining and does not appear infected -       Procedures:  none  Consultations: ID  Cardiology    Discharge Exam: Filed Vitals:   09/28/12 1436 09/28/12 2013 09/29/12 0425 09/29/12 1345  BP: 118/72 113/66 108/58 114/60  Pulse: 70 115 72 69  Temp: 97.9 F (36.6 C) 98.8 F (37.1 C) 99 F (37.2 C) 97.5 F (36.4 C)  TempSrc: Oral Oral Oral Oral  Resp: 18 18 18 20   Height:      Weight:      SpO2: 97% 95% 94% 95%    General: AAO x 3, no distress Cardiovascular: IIRR, no murmurs Respiratory: CTA b/l   Discharge Instructions      Discharge Orders   Future Orders Complete By Expires     Diet - low sodium heart healthy  As directed     Increase activity slowly  As directed         Medication List    STOP taking these medications       amLODipine 5 MG tablet  Commonly known as:  NORVASC     aspirin 81 MG tablet     atorvastatin 40 MG tablet  Commonly known as:  LIPITOR     doxycycline 100 MG capsule  Commonly known as:  VIBRAMYCIN     HYDROmorphone 2 MG tablet  Commonly known as:  DILAUDID     lisinopril 40 MG tablet  Commonly known as:  PRINIVIL,ZESTRIL      TAKE these medications       acetaminophen 325 MG tablet  Commonly known as:  TYLENOL  Take 2 tablets (650 mg total) by mouth every 6 (six) hours.     albuterol 108 (90 BASE) MCG/ACT inhaler  Commonly known as:  PROVENTIL HFA;VENTOLIN HFA  Inhale 1-2 puffs into the lungs every 4 (four) hours as needed for wheezing or shortness of breath.     apixaban 5 MG Tabs tablet  Commonly known as:  ELIQUIS  Take 1 tablet (5 mg total) by mouth 2 (two) times daily.     diltiazem 240 MG 24 hr capsule  Commonly known as:  CARDIZEM CD  Take 1 capsule (240 mg total) by mouth daily.     Fish Oil 500 MG Caps  Take 2 capsules by mouth 2 (two) times daily.     folic acid 1 MG tablet  Commonly known as:  FOLVITE  Take 1 mg by mouth daily.     FORTESTA 10 MG/ACT (2%) Gel  Generic drug:  Testosterone  Place 1 application onto the skin daily.      ibuprofen 400 MG tablet  Commonly known as:  ADVIL,MOTRIN  Take 1 tablet (400 mg total) by mouth every 6 (six) hours.     magnesium gluconate 500 MG tablet  Commonly known as:  MAGONATE  Take 500 mg by mouth every evening.     methotrexate 2.5 MG tablet  Commonly known as:  RHEUMATREX  Take 22.5 mg by mouth once a week. Sundays     metoprolol 100 MG tablet  Commonly known as:  LOPRESSOR  Take 1 tablet (100 mg total) by mouth 2 (two) times daily.     MULTI-VITAMIN PO  Take 1 tablet by mouth daily.     nitroGLYCERIN 0.4 MG SL tablet  Commonly known as:  NITROSTAT  Place 0.4 mg under the tongue every 5 (five) minutes as needed for chest pain.     pantoprazole 40 MG tablet  Commonly known as:  PROTONIX  Take 1 tablet (40 mg total) by mouth daily.     predniSONE 5 MG tablet  Commonly known as:  DELTASONE  Take 1 tablet (5 mg total) by mouth daily with breakfast.       Allergies  Allergen Reactions  . Oxycodone Itching  . Iodine Itching  . Zocor (Simvastatin) Other (See Comments)    Pain   Follow-up Information   Follow up with CORRINGTON,KIP A, MD In 1 week.   Contact information:   [address] [place] [address] [phone]       Follow up with Peter Swaziland, MD In 2 weeks.   Contact information:   [address] [place] [address] [phone]        The results of significant diagnostics from this hospitalization (including imaging, microbiology, ancillary and laboratory) are listed below for reference.    Significant Diagnostic Studies: Ct Abdomen Pelvis Wo Contrast  09/18/2012   *RADIOLOGY REPORT*  Clinical Data: Lower abdominal pain and fever.  CT ABDOMEN AND PELVIS WITHOUT CONTRAST  Technique:  Multidetector CT imaging of the abdomen and pelvis was performed following the standard protocol without intravenous contrast.  Comparison: MRI of the lumbar spine and right hip performed 03/17/2010  Findings: The visualized  lung bases are clear.  The liver and spleen are unremarkable in appearance.  The gallbladder is within normal limits.  The pancreas and adrenal glands are unremarkable.  Nonspecific perinephric stranding is noted bilaterally.  Small bilateral parapelvic renal cysts are seen.  Calcifications at the right renal hilum appear to be vascular in nature.  No free fluid is identified.  The small bowel is unremarkable in appearance.  The stomach is within normal limits.  No acute vascular abnormalities are seen.  Scattered calcification is noted along the abdominal aorta and its branches.  A small focus of fat adjacent to the third segment of the duodenum may reflect a small lipoma or a partially calcified epiploic appendage.  The appendix is normal in caliber and contains contrast, without evidence for appendicitis.  Contrast progresses to the level of the distal descending colon.  The colon is unremarkable in appearance.  The bladder is mildly distended and grossly unremarkable in appearance.  A small left inguinal hernia is seen, containing only fat.  The prostate remains normal in size.  No inguinal lymphadenopathy is seen.  No acute osseous abnormalities are identified.  Vacuum phenomenon is noted at L4-L5 and L5-S1.  IMPRESSION:  1.  No acute abnormality seen within the abdomen or pelvis. 2.  Nonspecific perinephric stranding noted bilaterally; small bilateral parapelvic renal cysts seen. 3.  Scattered calcification along the abdominal aorta and its branches. 4.  Small left inguinal hernia, containing only fat.   Original Report Authenticated By: Tonia Ghent, M.D.   Dg Chest 2 View  09/17/2012   *RADIOLOGY REPORT*  Clinical Data: Fever for 8 days.  History of smoking.  CHEST - 2 VIEW  Comparison: Chest radiograph performed 09/12/2012.  Findings: The lungs are well-aerated and clear.  There is no evidence of  focal opacification, pleural effusion or pneumothorax.  The heart is normal in size; the patient is status  post median sternotomy, with evidence of prior CABG.  No acute osseous abnormalities are seen.  IMPRESSION: No acute cardiopulmonary process seen.   Original Report Authenticated By: Tonia Ghent, M.D.   Dg Chest 2 View  09/12/2012   *RADIOLOGY REPORT*  Clinical Data: Cough and fever  CHEST - 2 VIEW  Comparison: 06/14/2012  Findings: Prior CABG.  Lungs are not clear without infiltrate or effusion.  Bibasilar atelectasis has resolved since the prior study.  Negative for heart failure or mass lesion.  IMPRESSION: No active cardiopulmonary abnormality.   Original Report Authenticated By: Janeece Riggers, M.D.   Ct Head Wo Contrast  09/18/2012   *RADIOLOGY REPORT*  Clinical Data: 62 year old male with fever of unknown origin.  CT HEAD WITHOUT CONTRAST  Technique:  Contiguous axial images were obtained from the base of the skull through the vertex without contrast.  Comparison: Brain and cervical spine MRI 10/14/2018 09/2005.  Findings: Paranasal sinus mucosal thickening has decreased, residual primarily in the left ethmoid air cells.  Mastoids are clear. No acute osseous abnormality identified.  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  Visualized orbits and scalp soft tissues are within normal limits.  Calcified atherosclerosis at the skull base.  Normal cerebral volume.  No midline shift, ventriculomegaly, mass effect, evidence of mass lesion, intracranial hemorrhage or evidence of cortically based acute infarction.  Gray-white matter differentiation is within normal limits throughout the brain.  No suspicious intracranial vascular hyperdensity.  IMPRESSION: 1. Normal noncontrast CT appearance of the brain. 2.  Mild paranasal sinus inflammatory changes, decreased since 2007.   Original Report Authenticated By: Erskine Speed, M.D.   Ct Chest Wo Contrast  09/18/2012   *RADIOLOGY REPORT*  Clinical Data: Fever, elevated liver function tests  CT CHEST WITHOUT CONTRAST  Technique:  Multidetector CT imaging  of the chest was performed following the standard protocol without IV contrast.  Comparison: CT abdomen/pelvis same date, chest CT 06/14/2012  Findings: Evidence of CABG noted.  Moderate atheromatous aortic calcification noted without aneurysm.  Heart size is normal.  No pericardial or pleural effusion.  Incomplete imaging of the upper abdomen redemonstrates hepatic hypodensity compatible with steatosis.  Curvilinear left lower lobe scarring is re-identified.  Trace pleural fluid or thickening is noted.  The lungs are otherwise clear.  Central airways are patent.  Subacute left posterior seventh and eighth rib fractures are noted.  IMPRESSION: No acute cardiopulmonary process.  Hepatic steatosis is redemonstrated, which may account for the history of elevated liver function tests.   Original Report Authenticated By: Christiana Pellant, M.D.    Microbiology: Recent Results (from the past 240 hour(s))  RESPIRATORY VIRUS PANEL     Status: None   Collection Time    09/20/12 10:39 PM      Result Value Range Status   Source - [facility] NASAL SWAB   Corrected   Comment: CORRECTED ON 07/22 AT 2007: PREVIOUSLY REPORTED AS NASAL SWAB   Respiratory Syncytial Virus A NOT DETECTED   Final   Respiratory Syncytial Virus B NOT DETECTED   Final   Influenza A NOT DETECTED   Final   Influenza B NOT DETECTED   Final   Parainfluenza 1 NOT DETECTED   Final   Parainfluenza 2 NOT DETECTED   Final   Parainfluenza 3 NOT DETECTED   Final   Metapneumovirus NOT DETECTED   Final   Rhinovirus NOT  DETECTED   Final   Adenovirus NOT DETECTED   Final   Influenza A H1 NOT DETECTED   Final   Influenza A H3 NOT DETECTED   Final   Comment: (NOTE)           Normal Reference Range for each Analyte: NOT DETECTED     Testing performed using the Lum[employer] xTAG Respiratory Viral Panel test     kit.     This test was developed and its performance characteristics determined     by [employer]. It has not been cleared or approved by the  [place]     Food and Drug Administration. This test is used for clinical purposes.     It should not be regarded as investigational or for research. This     laboratory is certified under the Clinical Laboratory Improvement     Amendments of 1988 (CLIA) as qualified to perform high complexity     clinical laboratory testing.     Labs: Basic Metabolic Panel:  Recent Labs Lab 09/23/12 0403 09/24/12 0410 09/25/12 0340 09/26/12 0405 09/27/12 0430 09/29/12 0445  NA 132* 132* 132* 134* 133* 136  K 4.0 4.2 4.0 4.6 4.6 4.6  CL 100 101 101 101 99 101  CO2 23 20 24 25 26 28   GLUCOSE 93 127* 86 120* 103* 92  BUN 11 12 10 10 10 14   CREATININE 0.89 0.92 0.77 0.81 0.84 0.89  CALCIUM 7.8* 7.5* 7.7* 7.8* 8.2* 8.1*  MG 2.0 2.0 1.9 2.0  --   --    Liver Function Tests:  Recent Labs Lab 09/23/12 0403 09/24/12 0410 09/25/12 0340 09/26/12 0405 09/27/12 0430  AST 61* 68* 81* 101* 84*  ALT 91* 99* 111* 145* 134*  ALKPHOS 76 103 118* 127* 149*  BILITOT 0.8 0.7 0.7 0.9 0.8  PROT 5.0* 4.7* 5.2* 4.8* 5.1*  ALBUMIN 2.0* 1.9* 1.9* 1.8* 1.9*   No results found for this basename: LIPASE, AMYLASE,  in the last 168 hours No results found for this basename: AMMONIA,  in the last 168 hours CBC:  Recent Labs Lab 09/23/12 0403 09/24/12 0410 09/25/12 0340 09/26/12 0405 09/29/12 0445  WBC 12.2* 12.6* 13.8* 14.2* 19.8*  NEUTROABS 2.7 2.8 3.7 3.7  --   HGB 12.7* 12.6* 12.9* 12.6* 13.0  HCT 36.1* 36.2* 36.9* 37.6* 40.0  MCV 93.5 93.5 93.4 94.9 96.6  PLT 248 260 283 244 246   Cardiac Enzymes: No results found for this basename: CKTOTAL, CKMB, CKMBINDEX, TROPONINI,  in the last 168 hours BNP: BNP (last 3 results) No results found for this basename: PROBNP,  in the last 8760 hours CBG: No results found for this basename: GLUCAP,  in the last 168 hours     Signed:  Khan Chura  [facility] 09/29/2012, 5:37 PM

## 2012-09-29 NOTE — Progress Notes (Signed)
TELEMETRY: Reviewed telemetry pt in atrial flutter rate control improved with higher dose of cardizem. Still increases with activity.: Filed Vitals:   09/28/12 0431 09/28/12 1436 09/28/12 2013 09/29/12 0425  BP: 117/81 118/72 113/66 108/58  Pulse: 88 70 115 72  Temp: 97.7 F (36.5 C) 97.9 F (36.6 C) 98.8 F (37.1 C) 99 F (37.2 C)  TempSrc: Oral Oral Oral Oral  Resp: 17 18 18 18   Height:      Weight:      SpO2: 93% 97% 95% 94%    Intake/Output Summary (Last 24 hours) at 09/29/12 1028 Last data filed at 09/29/12 0800  Gross per 24 hour  Intake    240 ml  Output      0 ml  Net    240 ml    SUBJECTIVE  No chest pain, SOB, or palpitations.  LABS: Basic Metabolic Panel:  Recent Labs  16/10/96 0430 09/29/12 0445  NA 133* 136  K 4.6 4.6  CL 99 101  CO2 26 28  GLUCOSE 103* 92  BUN 10 14  CREATININE 0.84 0.89  CALCIUM 8.2* 8.1*   Liver Function Tests:  Recent Labs  09/27/12 0430  AST 84*  ALT 134*  ALKPHOS 149*  BILITOT 0.8  PROT 5.1*  ALBUMIN 1.9*   CBC:  Recent Labs  09/29/12 0445  WBC 19.8*  HGB 13.0  HCT 40.0  MCV 96.6  PLT 246    Radiology/Studies:   Dg Chest 2 View  09/17/2012   *RADIOLOGY REPORT*  Clinical Data: Fever for 8 days.  History of smoking.  CHEST - 2 VIEW  Comparison: Chest radiograph performed 09/12/2012.  Findings: The lungs are well-aerated and clear.  There is no evidence of focal opacification, pleural effusion or pneumothorax.  The heart is normal in size; the patient is status post median sternotomy, with evidence of prior CABG.  No acute osseous abnormalities are seen.  IMPRESSION: No acute cardiopulmonary process seen.   Original Report Authenticated By: Tonia Ghent, M.D.      Ct Chest Wo Contrast  09/18/2012   *RADIOLOGY REPORT*  Clinical Data: Fever, elevated liver function tests  CT CHEST WITHOUT CONTRAST  Technique:  Multidetector CT imaging of the chest was performed following the standard protocol without IV  contrast.  Comparison: CT abdomen/pelvis same date, chest CT 06/14/2012  Findings: Evidence of CABG noted.  Moderate atheromatous aortic calcification noted without aneurysm.  Heart size is normal.  No pericardial or pleural effusion.  Incomplete imaging of the upper abdomen redemonstrates hepatic hypodensity compatible with steatosis.  Curvilinear left lower lobe scarring is re-identified.  Trace pleural fluid or thickening is noted.  The lungs are otherwise clear.  Central airways are patent.  Subacute left posterior seventh and eighth rib fractures are noted.  IMPRESSION: No acute cardiopulmonary process.  Hepatic steatosis is redemonstrated, which may account for the history of elevated liver function tests.   Original Report Authenticated By: Christiana Pellant, M.D.   Ecg: Atrial flutter with RVR. Nonspecific ST abnormality.  Echo:Study Conclusions  - Left ventricle: The cavity size was normal. Systolic function was normal. The estimated ejection fraction was in the range of 55% to 60%. Wall motion was normal; there were no regional wall motion abnormalities. - Aortic valve: Calcified non coronary cusp - Mitral valve: Calcified annulus. - Atrial septum: Poorly visualized seems lipomatous and mobile but no obvious PFO   PHYSICAL EXAM General: Well developed, obese, in no acute distress. Head: Normocephalic, atraumatic, sclera non-icteric,  no xanthomas, nares are without discharge. Neck: Negative for carotid bruits. JVD not elevated. Lungs: Clear bilaterally to auscultation without wheezes, rales, or rhonchi. Breathing is unlabored. Heart: IRRR, tachy, S1 S2 without murmurs, rubs, or gallops.  Abdomen: Soft, non-tender, non-distended with normoactive bowel sounds. No hepatomegaly. No rebound/guarding. No obvious abdominal masses. Msk:  Strength and tone appears normal for age. Extremities: No clubbing, cyanosis or edema.  Distal pedal pulses are 2+ and equal bilaterally. Neuro: Alert and  oriented X 3. Moves all extremities spontaneously. Psych:  Responds to questions appropriately with a normal affect.  ASSESSMENT AND PLAN: 1. Atrial flutter with RVR. Heart rate control improved. Will continue cardizem CD 240 mg daily. On metoprolol 100 mg bid.  Continue Eliquis for anticoagulation. ASA discontinued.  For now will treat with rate control and anticoagulation. Once febrile illness resolved will consider DCCV in 4 weeks if still out of rhythm. 2. CAD s/p CABG 2001. Myoview 3/12 showed small inferobasal scar with mild peri-infarct ischemia. Continue medical management. 3. CMV infection. 4. HL 5. PMR 6. Elevated LFTs- May be related to diltiazem but more likely due to infection or NSAIDs.  7. GI prophylaxis. Patient on chronic steroids and now on around the clock Ibuprofen. Since he is now on Eliquis he needs GI prophylaxis. Will start Protonix 40 mg daily.   Patient is stable for DC from a cardiac standpoint. Will need follow up in our office in 2 weeks. We will arrange.  Principal Problem:   Cytomegalovirus mononucleosis Active Problems:   Polymyalgia rheumatica   CAD (coronary artery disease)   Hyperlipidemia   HTN (hypertension)   Atrial flutter   Elevated liver enzymes    Signed, Peter Swaziland MD,FACC 09/29/2012 10:28 AM

## 2012-09-30 LAB — ARBOVIRUS PANEL, ~~LOC~~ LAB

## 2012-10-06 ENCOUNTER — Telehealth: Payer: Self-pay | Admitting: Cardiology

## 2012-10-06 NOTE — Telephone Encounter (Signed)
Dr Swaziland had opening 10/08/12 1:45PM. I have scheduled pt in that slot. LMTCB for pt.

## 2012-10-06 NOTE — Telephone Encounter (Signed)
New Prob      Pts wife calling to regarding pts follow up appt. Offered first available with NP/PA, pts wife declined and states he needs to be seen sooner. Please call.

## 2012-10-06 NOTE — Telephone Encounter (Signed)
Pt.notified

## 2012-10-08 ENCOUNTER — Ambulatory Visit (INDEPENDENT_AMBULATORY_CARE_PROVIDER_SITE_OTHER): Admitting: Cardiology

## 2012-10-08 ENCOUNTER — Other Ambulatory Visit: Payer: Self-pay | Admitting: Internal Medicine

## 2012-10-08 ENCOUNTER — Encounter: Payer: Self-pay | Admitting: Cardiology

## 2012-10-08 VITALS — BP 130/78 | HR 59 | Ht 71.0 in | Wt 239.8 lb

## 2012-10-08 DIAGNOSIS — I2510 Atherosclerotic heart disease of native coronary artery without angina pectoris: Secondary | ICD-10-CM

## 2012-10-08 DIAGNOSIS — I4892 Unspecified atrial flutter: Secondary | ICD-10-CM

## 2012-10-08 DIAGNOSIS — B2710 Cytomegaloviral mononucleosis without complications: Secondary | ICD-10-CM

## 2012-10-08 DIAGNOSIS — I10 Essential (primary) hypertension: Secondary | ICD-10-CM

## 2012-10-08 DIAGNOSIS — B2790 Infectious mononucleosis, unspecified without complication: Secondary | ICD-10-CM

## 2012-10-08 NOTE — Progress Notes (Signed)
William [name] Date of Birth: March 04, 1951 Medical Record #[id number]  History of Present Illness: William [name] is seen for followup today after recent hospitalization. He has a history of coronary disease and is status post coronary bypass surgery in January 2001. This included an LIMA graft to LAD, RIMA graft to the second OM, and sequential vein graft to the first and third obtuse marginal vessels, and saphenous vein graft to the acute marginal branch of the RCA. He was admitted this past month with fever and chills and elevated liver function tests. He was diagnosed with a CMV infection. During that hospitalization he developed atrial flutter with a rapid ventricular response. He was treated with rate control with metoprolol and diltiazem and he was anticoagulated. His wife thinks that he converted to sinus rhythm about 3 days ago. Patient has been completely asymptomatic. He did have an echocardiogram in the hospital which was unremarkable. His last Myoview study in November 2012 showed a fixed basal inferior defect without ischemia.   Current Outpatient Prescriptions on File Prior to Visit  Medication Sig Dispense Refill  . apixaban (ELIQUIS) 5 MG TABS tablet Take 1 tablet (5 mg total) by mouth 2 (two) times daily.  60 tablet  0  . diltiazem (CARDIZEM CD) 240 MG 24 hr capsule Take 1 capsule (240 mg total) by mouth daily.  30 capsule  0  . folic acid (FOLVITE) 1 MG tablet Take 1 mg by mouth daily.      . magnesium gluconate (MAGONATE) 500 MG tablet Take 500 mg by mouth every evening.       . methotrexate (RHEUMATREX) 2.5 MG tablet Take 22.5 mg by mouth once a week. Sundays      . metoprolol (LOPRESSOR) 100 MG tablet Take 1 tablet (100 mg total) by mouth 2 (two) times daily.  60 tablet  0  . Multiple Vitamin (MULTI-VITAMIN PO) Take 1 tablet by mouth daily.       . nitroGLYCERIN (NITROSTAT) 0.4 MG SL tablet Place 0.4 mg under the tongue every 5 (five) minutes as needed for chest pain.      . Omega-3  Fatty Acids (FISH OIL) 500 MG CAPS Take 2 capsules by mouth 2 (two) times daily.      . pantoprazole (PROTONIX) 40 MG tablet Take 1 tablet (40 mg total) by mouth daily.  30 tablet  0  . predniSONE (DELTASONE) 5 MG tablet Take 1 tablet (5 mg total) by mouth daily with breakfast.  30 tablet  0   No current facility-administered medications on file prior to visit.    Allergies  Allergen Reactions  . Oxycodone Itching  . Iodine Itching  . Zocor (Simvastatin) Other (See Comments)    Pain    Past Medical History  Diagnosis Date  . Coronary disease   . Hypercholesterolemia   . Polymyalgia rheumatica   . Insomnia   . Tobacco abuse   . Hypertension   . Myocardial infarction   . Pneumonia   . Atrial flutter 09/19/2012  . CMV (cytomegalovirus infection)     Past Surgical History  Procedure Laterality Date  . Cardiac catheterization  03/16/1999    ejection fraction 65%--After surgery pt. developed hives and itching  . Coronary artery bypass graft  03/19/1999    History  Smoking status  . Current Some Day Smoker  . Types: Cigars  Smokeless tobacco  . Not on file    History  Alcohol Use  . 0.6 oz/week  . 1 Cans of beer  per week    Comment: Occasional beer    Family History  Problem Relation Age of Onset  . Pancreatic cancer Mother   . Bladder Cancer Father   . Heart failure Father     Review of Systems: As noted in history of present illness .  He has had no further fever or chills. His appetite is good. He has lost some weight.  All other systems were reviewed and are negative.  Physical Exam: BP 130/78  Pulse 59  Ht 5\' 11"  (1.803 m)  Wt 239 lb 12.8 oz (108.773 kg)  BMI 33.46 kg/m2  he is a pleasant white male in no acute distress. HEENT: Normal Neck: No JVD or bruits. No adenopathy or thyromegaly. Lungs: Clear Cardiovascular: Regular rate and rhythm. Normal S1 and S2. No gallop, murmur, or click. Abdomen: Obese, soft, nontender. No masses or  hepatosplenomegaly. Extremities: No cyanosis or edema. Pulses are 2+ and symmetric. Skin: Warm and dry Neuro: Alert and oriented x3. Cranial nerves II through XII are intact.   LABORATORY DATA: ECG today demonstrates normal sinus rhythm with a rate of 59 beats per minute. It is normal.   Assessment/Plan:  1. Atrial flutter. Now converted to normal sinus rhythm. This is directly related to his infection. I recommended continuing anticoagulation until the end of this month. At that time he may stop anticoagulation and resume aspirin 81 mg daily. I will discontinue his diltiazem. We will reduce his metoprolol to 50 mg twice a day.  2. CMV infection  3. Hypertension-Will resume lisinopril 40 mg daily.  4. CAD status post CABG. Asymptomatic.  5. Polymyalgia rheumatica.  6. Hypercholesterolemia. Statin therapy on hold until recovery of liver function studies.

## 2012-10-08 NOTE — Patient Instructions (Signed)
Stop diltiazem  Reduce metoprolol to 50 mg bid  Resume lisinopril to 40 mg daily.  You may stop Eliquis on September 1 and resume ASA 81 mg daily.  I will see you in 4 months.

## 2012-10-09 MED ORDER — METOPROLOL TARTRATE 100 MG PO TABS: 100.0000 mg | ORAL_TABLET | Freq: Two times a day (BID) | ORAL | Status: DC

## 2012-10-09 NOTE — Telephone Encounter (Signed)
Patient would like lopressor refilled

## 2012-10-21 ENCOUNTER — Encounter: Payer: Self-pay | Admitting: Gastroenterology

## 2012-12-04 ENCOUNTER — Other Ambulatory Visit: Payer: Self-pay | Admitting: Cardiology

## 2012-12-09 ENCOUNTER — Encounter: Payer: Self-pay | Admitting: Gastroenterology

## 2012-12-19 ENCOUNTER — Other Ambulatory Visit: Payer: Self-pay | Admitting: Internal Medicine

## 2013-01-07 ENCOUNTER — Other Ambulatory Visit: Payer: Self-pay

## 2013-01-26 ENCOUNTER — Encounter

## 2013-02-04 ENCOUNTER — Encounter: Payer: Self-pay | Admitting: Physician Assistant

## 2013-02-04 ENCOUNTER — Ambulatory Visit (INDEPENDENT_AMBULATORY_CARE_PROVIDER_SITE_OTHER): Admitting: Physician Assistant

## 2013-02-04 ENCOUNTER — Ambulatory Visit: Admitting: Physician Assistant

## 2013-02-04 VITALS — BP 133/89 | HR 63 | Ht 71.0 in | Wt 241.0 lb

## 2013-02-04 DIAGNOSIS — I2510 Atherosclerotic heart disease of native coronary artery without angina pectoris: Secondary | ICD-10-CM

## 2013-02-04 DIAGNOSIS — I10 Essential (primary) hypertension: Secondary | ICD-10-CM

## 2013-02-04 DIAGNOSIS — I4892 Unspecified atrial flutter: Secondary | ICD-10-CM

## 2013-02-04 DIAGNOSIS — F17200 Nicotine dependence, unspecified, uncomplicated: Secondary | ICD-10-CM

## 2013-02-04 DIAGNOSIS — J40 Bronchitis, not specified as acute or chronic: Secondary | ICD-10-CM

## 2013-02-04 DIAGNOSIS — Z720 Tobacco use: Secondary | ICD-10-CM

## 2013-02-04 DIAGNOSIS — E785 Hyperlipidemia, unspecified: Secondary | ICD-10-CM

## 2013-02-04 MED ORDER — ALBUTEROL SULFATE HFA 108 (90 BASE) MCG/ACT IN AERS: 1.0000 | INHALATION_SPRAY | Freq: Four times a day (QID) | RESPIRATORY_TRACT | Status: AC | PRN

## 2013-02-04 MED ORDER — NITROGLYCERIN 0.4 MG SL SUBL: 0.4000 mg | SUBLINGUAL_TABLET | SUBLINGUAL | Status: DC | PRN

## 2013-02-04 MED ORDER — METOPROLOL TARTRATE 100 MG PO TABS: 50.0000 mg | ORAL_TABLET | Freq: Two times a day (BID) | ORAL | Status: DC

## 2013-02-04 NOTE — Patient Instructions (Addendum)
Use the Albuterol only as needed.    Keep an eye on your blood pressure.  Call if it is always 140/90 or higher.  Your physician wants you to follow-up in:  6 MONTHS WITH DR Swaziland.  You will receive a reminder letter in the mail two months in advance. If you don't receive a letter, please call our office to schedule the follow-up appointment.

## 2013-02-04 NOTE — Progress Notes (Signed)
[address], [address] [place], [place]  [address] Phone: [phone] Fax:  [phone]  Date:  02/04/2013   ID:  KAYDEN HUTMACHER, DOB March 14, 1950, MRN [id number]  PCP:  Vivien Presto, MD  Cardiologist:  Dr. Peter Swaziland     History of Present Illness: ANTRON SETH is a 62 y.o. male with the history of CAD, status post CABG in 03/1999 (LIMA-LAD, RIMA-OM2, SVG-OM1/OM3, SVG-AM of the RCA), HTN, HL, atrial flutter, PMR. Myoview (04/2010):  Inf infarct with peri-infarct ischemia, EF 61%.  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 Echo (09/25/12):  EF 55-60%, MAC.  When last seen by Dr. Swaziland in 10/2012 he had converted back to NSR. He completed another month of anticoagulation therapy and then discontinued Eliquis and resumed aspirin.  He returns for routine f/u.  He is overall doing well.  He currently is taking antibiotics for bronchitis.  He is requesting an albuterol inhaler as he has had improvement in the past with this.  He continues to smoke cigars.  He denies DOE.  Denies CP, syncope, orthopnea, PND, edema.    Recent Labs: 09/22/2012: TSH 0.951  09/27/2012: ALT 134*  09/29/2012: Creatinine 0.89; Hemoglobin 13.0; Potassium 4.6   Wt Readings from Last 3 Encounters:  02/04/13 241 lb (109.317 kg)  10/08/12 239 lb 12.8 oz (108.773 kg)  09/24/12 256 lb 9.9 oz (116.4 kg)     Past Medical History  Diagnosis Date  . Coronary disease   . Hypercholesterolemia   . Polymyalgia rheumatica   . Insomnia   . Tobacco abuse   . Hypertension   . Myocardial infarction   . Pneumonia   . Atrial flutter 09/19/2012  . CMV (cytomegalovirus infection)     Current Outpatient Prescriptions  Medication Sig Dispense Refill  . aspirin 81 MG tablet Take 81 mg by mouth daily.      Marland Kitchen atorvastatin (LIPITOR) 20 MG tablet Take 20 mg by mouth daily.      . folic acid (FOLVITE) 1 MG tablet Take 1 mg by mouth daily.      Marland Kitchen lisinopril (PRINIVIL,ZESTRIL) 40 MG  tablet Take 40 mg by mouth daily.      . magnesium gluconate (MAGONATE) 500 MG tablet Take 500 mg by mouth every evening.       . methotrexate (RHEUMATREX) 2.5 MG tablet Take 22.5 mg by mouth once a week. Sundays      . metoprolol (LOPRESSOR) 100 MG tablet Take 1 tablet (100 mg total) by mouth 2 (two) times daily.  60 tablet  0  . Multiple Vitamin (MULTI-VITAMIN PO) Take 1 tablet by mouth daily.       . nitroGLYCERIN (NITROSTAT) 0.4 MG SL tablet Place 0.4 mg under the tongue every 5 (five) minutes as needed for chest pain.      . Omega-3 Fatty Acids (FISH OIL) 500 MG CAPS Take 2 capsules by mouth 2 (two) times daily.      . predniSONE (DELTASONE) 5 MG tablet Take 1 tablet (5 mg total) by mouth daily with breakfast.  30 tablet  0   No current facility-administered medications for this visit.    Allergies:   Oxycodone; Iodine; and Zocor   Social History:  The patient  reports that he has been smoking Cigars.  He does not have any smokeless tobacco history on file. He reports that he drinks about 0.6 ounces of alcohol per week. He reports that he does not  use illicit drugs.   Family History:  The patient's family history includes Bladder Cancer in his father; Heart failure in his father; Pancreatic cancer in his mother.   ROS:  Please see the history of present illness.    All other systems reviewed and negative.   PHYSICAL EXAM: VS:  BP 133/89  Pulse 63  Ht 5\' 11"  (1.803 m)  Wt 241 lb (109.317 kg)  BMI 33.63 kg/m2 Well nourished, well developed, in no acute distress HEENT: normal Neck: no JVD Vascular:  No carotid bruits bilat Cardiac:  normal S1, S2; RRR; no murmur Lungs:  clear to auscultation bilaterally, no wheezing, rhonchi or rales Abd: soft, nontender, no hepatomegaly Ext: no edema Skin: warm and dry Neuro:  CNs 2-12 intact, no focal abnormalities noted  EKG:  NSR, HR 63, normal axis, no ST changes     ASSESSMENT AND PLAN:  1. Acute Bronchitis: Resolving. I will provide  him with a prescription for albuterol when necessary. We discussed how to use this. He will followup with his PCP for further refills. 2. CAD: Stable. No angina. Continue aspirin, statin. 3. Atrial Flutter: He is maintaining NSR. He does not require anticoagulation. Continue current dose of metoprolol and aspirin. 4. Hypertension: Borderline control. I have asked him to continue to monitor his blood pressures at home. We also discussed weight loss and increased activity as well as tobacco cessation. 5. Hyperlipidemia: Continue statin. 6. Tobacco Abuse:  I have recommended he quit.  7. Disposition: Follow up with Dr. Swaziland in 6 months  Signed, Tereso Newcomer, PA-C  02/04/2013 10:21 AM

## 2013-02-09 ENCOUNTER — Other Ambulatory Visit: Admitting: Gastroenterology

## 2013-03-11 ENCOUNTER — Ambulatory Visit (AMBULATORY_SURGERY_CENTER): Payer: Self-pay

## 2013-03-11 VITALS — Ht 71.0 in | Wt 247.8 lb

## 2013-03-11 DIAGNOSIS — Z800 Family history of malignant neoplasm of digestive organs: Secondary | ICD-10-CM

## 2013-03-11 MED ORDER — MOVIPREP 100 G PO SOLR: 1.0000 | Freq: Once | ORAL | Status: DC

## 2013-03-17 ENCOUNTER — Encounter: Payer: Self-pay | Admitting: Cardiology

## 2013-03-23 ENCOUNTER — Other Ambulatory Visit: Admitting: Gastroenterology

## 2013-03-26 ENCOUNTER — Ambulatory Visit (AMBULATORY_SURGERY_CENTER): Admitting: Gastroenterology

## 2013-03-26 ENCOUNTER — Encounter: Payer: Self-pay | Admitting: Gastroenterology

## 2013-03-26 VITALS — BP 115/78 | HR 52 | Temp 96.6°F | Resp 16 | Ht 71.0 in | Wt 247.0 lb

## 2013-03-26 DIAGNOSIS — D126 Benign neoplasm of colon, unspecified: Secondary | ICD-10-CM

## 2013-03-26 DIAGNOSIS — K5730 Diverticulosis of large intestine without perforation or abscess without bleeding: Secondary | ICD-10-CM

## 2013-03-26 DIAGNOSIS — Z800 Family history of malignant neoplasm of digestive organs: Secondary | ICD-10-CM

## 2013-03-26 DIAGNOSIS — Z1211 Encounter for screening for malignant neoplasm of colon: Secondary | ICD-10-CM

## 2013-03-26 MED ORDER — SODIUM CHLORIDE 0.9 % IV SOLN: 500.0000 mL | INTRAVENOUS | Status: DC

## 2013-03-26 NOTE — Op Note (Signed)
[place]  [address] [place], [address]   COLONOSCOPY PROCEDURE REPORT  PATIENT: William [name], William [name]  MR#: [id number] BIRTHDATE: Apr 04, 1950 , 72  yrs. old GENDER: Male ENDOSCOPIST: Milus Banister, MD PROCEDURE DATE:  03/26/2013 PROCEDURE:   Colonoscopy with snare polypectomy First Screening Colonoscopy - Avg.  risk and is 50 yrs.  old or older - No.  Prior Negative Screening - Now for repeat screening. Above average risk  History of Adenoma - Now for follow-up colonoscopy & has been > or = to 3 yrs.  N/A  Polyps Removed Today? Yes. ASA CLASS:   Class II INDICATIONS:elevated risk screening, mother had colon cancer MEDICATIONS: Fentanyl 50 mcg IV, Versed 7 mg IV, and These medications were titrated to patient response per physician's verbal order  DESCRIPTION OF PROCEDURE:   After the risks benefits and alternatives of the procedure were thoroughly explained, informed consent was obtained.  A digital rectal exam revealed no abnormalities of the rectum.   The [id number]  endoscope was introduced through the anus and advanced to the cecum, which was identified by both the appendix and ileocecal valve. No adverse events experienced.   The quality of the prep was good.  The instrument was then slowly withdrawn as the colon was fully examined.  COLON FINDINGS: Two polyps were found, removed and sent to pathology.  These were both sessile, located in descending segment, removed with cold snare and sent to pathology.  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  The examination was otherwise normal. Retroflexed views revealed no abnormalities. The time to cecum=2 minutes 32 seconds.  Withdrawal time=10 minutes 09 seconds.  The scope was withdrawn and the procedure completed. COMPLICATIONS: There were no complications.  ENDOSCOPIC IMPRESSION: Two polyps were found, removed and sent to pathology. 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 The  examination was otherwise normal.  RECOMMENDATIONS: If the polyp(s) removed today are proven to be adenomatous (pre-cancerous) polyps, you will need a repeat colonoscopy in 5 years.  You will receive a letter within 1-2 weeks with the results of your biopsy as well as final recommendations.  Please call my office if you have not received a letter after 3 weeks.   eSigned:  Milus Banister, MD 03/26/2013 10:35 AM

## 2013-03-26 NOTE — Patient Instructions (Signed)
YOU HAD AN ENDOSCOPIC PROCEDURE TODAY AT THE [facility]: Refer to the procedure report that was given to you for any specific questions about what was found during the examination.  If the procedure report does not answer your questions, please call your gastroenterologist to clarify.  If you requested that your care partner not be given the details of your procedure findings, then the procedure report has been included in a sealed envelope for you to review at your convenience later.  YOU SHOULD EXPECT: Some feelings of bloating in the abdomen. Passage of more gas than usual.  Walking can help get rid of the air that was put into your GI tract during the procedure and reduce the bloating. If you had a lower endoscopy (such as a colonoscopy or flexible sigmoidoscopy) you may notice spotting of blood in your stool or on the toilet paper. If you underwent a bowel prep for your procedure, then you may not have a normal bowel movement for a few days.  DIET: Your first meal following the procedure should be a light meal and then it is ok to progress to your normal diet.  A half-sandwich or bowl of soup is an example of a good first meal.  Heavy or fried foods are harder to digest and may make you feel nauseous or bloated.  Likewise meals heavy in dairy and vegetables can cause extra gas to form and this can also increase the bloating.  Drink plenty of fluids but you should avoid alcoholic beverages for 24 hours.  ACTIVITY: Your care partner should take you home directly after the procedure.  You should plan to take it easy, moving slowly for the rest of the day.  You can resume normal activity the day after the procedure however you should NOT DRIVE or use heavy machinery for 24 hours (because of the sedation medicines used during the test).    SYMPTOMS TO REPORT IMMEDIATELY: A gastroenterologist can be reached at any hour.  During normal business hours, 8:30 AM to 5:00 PM Monday through Friday,  call [phone].  After hours and on weekends, please call the GI answering service at [phone] who will take a message and have the physician on call contact you.   Following lower endoscopy (colonoscopy or flexible sigmoidoscopy):  Excessive amounts of blood in the stool  Significant tenderness or worsening of abdominal pains  Swelling of the abdomen that is new, acute  Fever of 100F or higher    FOLLOW UP: If any biopsies were taken you will be contacted by phone or by letter within the next 1-3 weeks.  Call your gastroenterologist if you have not heard about the biopsies in 3 weeks.  Our staff will call the home number listed on your records the next business day following your procedure to check on you and address any questions or concerns that you may have at that time regarding the information given to you following your procedure. This is a courtesy call and so if there is no answer at the home number and we have not heard from you through the emergency physician on call, we will assume that you have returned to your regular daily activities without incident.  SIGNATURES/CONFIDENTIALITY: You and/or your care partner have signed paperwork which will be entered into your electronic medical record.  These signatures attest to the fact that that the information above on your After Visit Summary has been reviewed and is understood.  Full responsibility of the confidentiality   of this discharge information lies with you and/or your care-partner.     

## 2013-03-29 ENCOUNTER — Telehealth: Payer: Self-pay | Admitting: *Deleted

## 2013-03-29 NOTE — Telephone Encounter (Signed)
  Follow up Call-  Call back number 03/26/2013  Post procedure Call Back phone  # [phone]  Permission to leave phone message Yes     Patient questions:  Do you have a fever, pain , or abdominal swelling? no Pain Score  0 *  Have you tolerated food without any problems? yes  Have you been able to return to your normal activities? yes  Do you have any questions about your discharge instructions: Diet   no Medications  no Follow up visit  no  Do you have questions or concerns about your Care? no  Actions: * If pain score is 4 or above: No action needed, pain <4.

## 2013-04-01 ENCOUNTER — Encounter: Payer: Self-pay | Admitting: Gastroenterology

## 2013-08-19 ENCOUNTER — Ambulatory Visit (INDEPENDENT_AMBULATORY_CARE_PROVIDER_SITE_OTHER): Admitting: Physician Assistant

## 2013-08-19 ENCOUNTER — Encounter: Payer: Self-pay | Admitting: Physician Assistant

## 2013-08-19 VITALS — BP 136/80 | HR 55 | Ht 71.0 in | Wt 248.0 lb

## 2013-08-19 DIAGNOSIS — I2510 Atherosclerotic heart disease of native coronary artery without angina pectoris: Secondary | ICD-10-CM

## 2013-08-19 DIAGNOSIS — I10 Essential (primary) hypertension: Secondary | ICD-10-CM

## 2013-08-19 DIAGNOSIS — I4892 Unspecified atrial flutter: Secondary | ICD-10-CM

## 2013-08-19 DIAGNOSIS — I440 Atrioventricular block, first degree: Secondary | ICD-10-CM | POA: Insufficient documentation

## 2013-08-19 DIAGNOSIS — E785 Hyperlipidemia, unspecified: Secondary | ICD-10-CM

## 2013-08-19 DIAGNOSIS — F17200 Nicotine dependence, unspecified, uncomplicated: Secondary | ICD-10-CM

## 2013-08-19 DIAGNOSIS — Z720 Tobacco use: Secondary | ICD-10-CM

## 2013-08-19 DIAGNOSIS — I483 Typical atrial flutter: Secondary | ICD-10-CM

## 2013-08-19 NOTE — Patient Instructions (Signed)
NO CHANGES WERE MADE TODAY  Your physician wants you to follow-up in: 1 YEAR WITH DR. Martinique. You will receive a reminder letter in the mail two months in advance. If you don't receive a letter, please call our office to schedule the follow-up appointment.

## 2013-08-19 NOTE — Progress Notes (Signed)
Cardiology Office Note    Date:  08/19/2013   ID:  William [name], DOB Oct 12, 1950, MRN [id number]  PCP:  [name], MD  Cardiologist:  Dr. Peter Martinique     History of Present Illness: William [name] is a 63 y.o. male with a history of CAD, s/p CABG in 03/1999 (LIMA-LAD, RIMA-OM2, SVG-OM1/OM3, SVG-AM of the RCA), HTN, HL, atrial flutter, PMR.   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  He converted to NSR on his own and Eliquis was eventually d/c'd.  He returns for followup.  Since last seen, he has been doing well. He denies chest pain, shortness of breath, syncope, orthopnea, PND or edema.   Studies  - Myoview (04/2010):  Inf infarct with peri-infarct ischemia, EF 61%.  - Echo (09/25/12):  EF 55-60%, MAC   Recent Labs: 09/22/2012: TSH 0.951  09/27/2012: ALT 134*  09/29/2012: Creatinine 0.89; Hemoglobin 13.0; Potassium 4.6   Wt Readings from Last 3 Encounters:  08/19/13 248 lb (112.492 kg)  03/26/13 247 lb (112.038 kg)  03/11/13 247 lb 12.8 oz (112.401 kg)     Past Medical History  Diagnosis Date  . Coronary disease   . Hypercholesterolemia   . Polymyalgia rheumatica   . Insomnia   . Tobacco abuse   . Hypertension   . Myocardial infarction   . Pneumonia   . Atrial flutter 09/19/2012  . CMV (cytomegalovirus infection)     Current Outpatient Prescriptions  Medication Sig Dispense Refill  . albuterol (PROVENTIL HFA;VENTOLIN HFA) 108 (90 BASE) MCG/ACT inhaler Inhale 1-2 puffs into the lungs every 6 (six) hours as needed for wheezing or shortness of breath.  1 Inhaler  0  . aspirin 81 MG tablet Take 81 mg by mouth daily.      Marland Kitchen atorvastatin (LIPITOR) 20 MG tablet Take 20 mg by mouth daily.      . folic acid (FOLVITE) 1 MG tablet Take 1 mg by mouth daily.      Marland Kitchen lisinopril (PRINIVIL,ZESTRIL) 40 MG tablet Take 40 mg by mouth daily.      . magnesium gluconate (MAGONATE) 500 MG tablet Take 500 mg by mouth every evening.        . methotrexate (RHEUMATREX) 2.5 MG tablet Take 22.5 mg by mouth once a week. Sundays      . metoprolol (LOPRESSOR) 100 MG tablet Take 0.5 tablets (50 mg total) by mouth 2 (two) times daily.  60 tablet  0  . Multiple Vitamin (MULTI-VITAMIN PO) Take 1 tablet by mouth daily.       . nitroGLYCERIN (NITROSTAT) 0.4 MG SL tablet Place 1 tablet (0.4 mg total) under the tongue every 5 (five) minutes as needed for chest pain.  25 tablet  11  . Omega-3 Fatty Acids (FISH OIL) 500 MG CAPS Take 2 capsules by mouth 2 (two) times daily.      . predniSONE (DELTASONE) 5 MG tablet Take 1 tablet (5 mg total) by mouth daily with breakfast.  30 tablet  0   No current facility-administered medications for this visit.    Allergies:   Oxycodone; Iodine; and Zocor   Social History:  The patient  reports that he has been smoking Cigars.  He has never used smokeless tobacco. He reports that he drinks about .6 ounces of alcohol per week. He reports that he does not use illicit drugs.   Family History:  The patient's family history includes Bladder Cancer in his  father; Colon cancer in his mother; Heart failure in his father; Pancreatic cancer in his mother.   ROS:  Please see the history of present illness.   All other systems reviewed and negative.   PHYSICAL EXAM: VS:  BP 136/80  Pulse 55  Ht 5\' 11"  (1.803 m)  Wt 248 lb (112.492 kg)  BMI 34.60 kg/m2 Well nourished, well developed, in no acute distress HEENT: normal Neck: no JVD Vascular:  No carotid bruits bilat Cardiac:  normal S1, S2; RRR; no murmur Lungs:  clear to auscultation bilaterally, no wheezing, rhonchi or rales Abd: soft, nontender, no hepatomegaly Ext: no edema Skin: warm and dry Neuro:  CNs 2-12 intact, no focal abnormalities noted  EKG:  Sinus bradycardia, HR 55, normal axis, no ST changes, first-degree AV block (PR 224 ms)      ASSESSMENT AND PLAN:  1. CAD:   Stable. No angina. Continue aspirin, statin. 2. Atrial Flutter:  He is  maintaining NSR. He does not require anticoagulation. Continue current dose of metoprolol and aspirin. 3. Hypertension:  Controlled. 4. Hyperlipidemia:  Continue statin.  Goal LDL is less than 70. This is managed by his primary care physician. 5. 1st Degree AV Block:  Asymptomatic. Continue current therapy. 6. Tobacco Abuse:  He is considering quitting. 7. Disposition: Follow up with Dr. Martinique in one year.   Signed, Versie Starks, MHS 08/19/2013 9:02 AM    [facility] [address], [place], [place]  [address] Phone: [phone]; Fax: [phone]

## 2014-02-04 ENCOUNTER — Other Ambulatory Visit: Payer: Self-pay | Admitting: Rheumatology

## 2014-02-04 DIAGNOSIS — M5416 Radiculopathy, lumbar region: Secondary | ICD-10-CM

## 2014-02-11 ENCOUNTER — Ambulatory Visit
Admission: RE | Admit: 2014-02-11 | Discharge: 2014-02-11 | Disposition: A | Discharge status: Home / Self Care | Source: Ambulatory Visit | Attending: Rheumatology | Admitting: Rheumatology

## 2014-02-11 DIAGNOSIS — M5416 Radiculopathy, lumbar region: Secondary | ICD-10-CM

## 2014-03-10 ENCOUNTER — Ambulatory Visit (INDEPENDENT_AMBULATORY_CARE_PROVIDER_SITE_OTHER): Admitting: Otolaryngology

## 2014-03-10 DIAGNOSIS — R070 Pain in throat: Secondary | ICD-10-CM

## 2014-03-10 DIAGNOSIS — R1312 Dysphagia, oropharyngeal phase: Secondary | ICD-10-CM

## 2014-06-17 ENCOUNTER — Telehealth: Payer: Self-pay | Admitting: Cardiology

## 2014-06-17 NOTE — Telephone Encounter (Signed)
He can hold ASA for 5 days for extraction.  Lilla Callejo Martinique MD, [facility]

## 2014-06-17 NOTE — Telephone Encounter (Signed)
Pt is going to have 8 teeth extracted. He wants to know when does he need to stop his 81 mg of aspirin? If mot there please leave a message.

## 2014-06-17 NOTE — Telephone Encounter (Signed)
Informed patient that Dr Martinique was not in the office. Patient states appointment is @ 2 weeks. Patient is aware will call back

## 2014-06-17 NOTE — Telephone Encounter (Signed)
Spoke to patient. Information given.

## 2014-07-05 ENCOUNTER — Encounter: Payer: Self-pay | Admitting: Gastroenterology

## 2014-11-13 DIAGNOSIS — K3580 Unspecified acute appendicitis: Secondary | ICD-10-CM | POA: Insufficient documentation

## 2015-02-03 ENCOUNTER — Telehealth: Payer: Self-pay | Admitting: Cardiology

## 2015-02-03 NOTE — Telephone Encounter (Signed)
Dr Neta Mends spoke to Dr [facility] Patient is in Dr Corrington's office aflutter  Patient is taking metoprolol tartrate 50 mg daily Patient has an appointment with Dr Martinique on  02/06/15- Monday  Per Dr Percival Spanish -- stop aspirin, start XARELTO 20 MG daily    Increase metoprolol tartratre to 50 mg twice a day.  Dr Neta Mends will do all the changes and informed patient.

## 2015-02-06 ENCOUNTER — Encounter: Payer: Self-pay | Admitting: Cardiology

## 2015-02-06 ENCOUNTER — Ambulatory Visit (INDEPENDENT_AMBULATORY_CARE_PROVIDER_SITE_OTHER): Payer: Medicare Other | Admitting: Cardiology

## 2015-02-06 VITALS — BP 160/82 | HR 79 | Ht 71.0 in | Wt 238.5 lb

## 2015-02-06 DIAGNOSIS — I10 Essential (primary) hypertension: Secondary | ICD-10-CM | POA: Diagnosis not present

## 2015-02-06 DIAGNOSIS — E785 Hyperlipidemia, unspecified: Secondary | ICD-10-CM | POA: Diagnosis not present

## 2015-02-06 DIAGNOSIS — M353 Polymyalgia rheumatica: Secondary | ICD-10-CM

## 2015-02-06 DIAGNOSIS — I2510 Atherosclerotic heart disease of native coronary artery without angina pectoris: Secondary | ICD-10-CM | POA: Diagnosis not present

## 2015-02-06 DIAGNOSIS — I483 Typical atrial flutter: Secondary | ICD-10-CM | POA: Diagnosis not present

## 2015-02-06 NOTE — Progress Notes (Signed)
Cardiology Office Note    Date:  02/06/2015   ID:  William [name], DOB 1950-10-18, MRN [id number]  PCP:  [name], MD  Cardiologist:  Dr. Peter Martinique     History of Present Illness: William [name] is a 64 y.o. male with a history of CAD, s/p CABG in 03/1999 (LIMA-LAD, RIMA-OM2, SVG-OM1/OM3, SVG-AM of the RCA), HTN, HL, atrial flutter, PMR.   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  He converted to NSR on his own and Eliquis was eventually d/c'd.  He was recently seen at the [facility] clinic and was noted to have an irregular pulse. He was noted to be in atrial flutter. He denied any symptoms. He was seen by primary care and started on Xarelto. Metoprolol dose was increased for rate control. He denies any chest pain, SOB, dizziness, or palpitations. No edema. He states he has been doing well with no other recent health problems other than epidural injection for back pain. With dietary modification he has lost 20 lbs this year.    Studies  - Myoview (04/2010):  Inf infarct with peri-infarct ischemia, EF 61%.  - Echo (09/25/12):  EF 55-60%, MAC   Recent Labs: No results found for requested labs within last 365 days.  Wt Readings from Last 3 Encounters:  02/06/15 108.183 kg (238 lb 8 oz)  02/11/14 117.028 kg (258 lb)  08/19/13 112.492 kg (248 lb)     Past Medical History  Diagnosis Date  . Coronary disease   . Hypercholesterolemia   . Polymyalgia rheumatica ([place])   . Insomnia   . Tobacco abuse   . Hypertension   . Myocardial infarction ([place])   . Pneumonia   . Atrial flutter ([place]) 09/19/2012  . CMV (cytomegalovirus infection) ([place])     Current Outpatient Prescriptions  Medication Sig Dispense Refill  . albuterol (PROVENTIL HFA;VENTOLIN HFA) 108 (90 BASE) MCG/ACT inhaler Inhale 1-2 puffs into the lungs every 6 (six) hours as needed for wheezing or shortness of breath. 1 Inhaler 0  . aspirin 81 MG tablet Take 81 mg by mouth  daily.    Marland Kitchen atorvastatin (LIPITOR) 20 MG tablet Take 20 mg by mouth daily.    . folic acid (FOLVITE) 1 MG tablet Take 1 mg by mouth daily.    Marland Kitchen HYDROcodone-homatropine (HYDROMET) 5-1.5 MG/5ML syrup Take 5 mLs by mouth. Use as directed    . lisinopril (PRINIVIL,ZESTRIL) 40 MG tablet Take 40 mg by mouth daily.    . magnesium gluconate (MAGONATE) 500 MG tablet Take 500 mg by mouth every evening.     . methotrexate (RHEUMATREX) 2.5 MG tablet Take 22.5 mg by mouth once a week. Sundays    . metoprolol (LOPRESSOR) 100 MG tablet Take 0.5 tablets (50 mg total) by mouth 2 (two) times daily. 60 tablet 0  . Multiple Vitamin (MULTI-VITAMIN PO) Take 1 tablet by mouth daily.     . nitroGLYCERIN (NITROSTAT) 0.4 MG SL tablet Place 1 tablet (0.4 mg total) under the tongue every 5 (five) minutes as needed for chest pain. 25 tablet 11  . Omega-3 Fatty Acids (FISH OIL) 500 MG CAPS Take 2 capsules by mouth 2 (two) times daily.    . predniSONE (DELTASONE) 5 MG tablet Take 1 tablet (5 mg total) by mouth daily with breakfast. 30 tablet 0  . XARELTO 20 MG TABS tablet Take 1 tablet by mouth daily. Take 1 tab daily  1   No current  facility-administered medications for this visit.    Allergies:   Oxycodone; Iodine; and Zocor   Social History:  The patient  reports that he has been smoking Cigars.  He has never used smokeless tobacco. He reports that he drinks about 0.6 oz of alcohol per week. He reports that he does not use illicit drugs.   Family History:  The patient's family history includes Bladder Cancer in his father; Colon cancer in his mother; Heart failure in his father; Pancreatic cancer in his mother.   ROS:  Please see the history of present illness.   All other systems reviewed and negative.   PHYSICAL EXAM: VS:  BP 160/82 mmHg  Pulse 79  Ht 5\' 11"  (1.803 m)  Wt 108.183 kg (238 lb 8 oz)  BMI 33.28 kg/m2 Well nourished, well developed, in no acute distress HEENT: normal Neck: no JVD Vascular:  No  carotid bruits bilat Cardiac:  normal S1, S2; IRRR; no murmur Lungs:  clear to auscultation bilaterally, no wheezing, rhonchi or rales Abd: soft, nontender, no hepatomegaly Ext: no edema Skin: warm and dry Neuro:  CNs 2-12 intact, no focal abnormalities noted  EKG:  Atrial flutter with variable response. Rate 79. Otherwise normal. I have personally reviewed and interpreted this study.       ASSESSMENT AND PLAN:  1. CAD:   Stable. No angina. Continue aspirin, statin. 2. Atrial Flutter:  Recurrent. Rate well controlled on metoprolol. Appropriately started on Xarelto for anticoagulation. CHAD-Vasc score of 3. Duration of flutter is unknown. Will update Echo and get lab results from the [place].  Recommend anticoagulation for one month then will plan elective DCCV if still out of rhythm. If he then has recurrent Aflutter I would consider him for an ablation. My last choice would be AAD therapy given long term risk of side effects and cost.  3. Hypertension:  Controlled. 4. Hyperlipidemia:  Continue statin.   5. Tobacco Abuse:  Encourage cessation. 6. Disposition: Follow up in one month.   Signed, Peter Martinique MD, [facility]    02/06/2015 12:43 PM

## 2015-02-06 NOTE — Patient Instructions (Signed)
Continue your current therapy  We will schedule you for an Echocardiogram  Please get [place] a copy of your lab work from the [place]  We will see you in one month.

## 2015-02-09 ENCOUNTER — Telehealth: Payer: Self-pay | Admitting: Cardiology

## 2015-02-09 NOTE — Telephone Encounter (Signed)
Received lab work from [place] in [place] will show to [facility] 02/10/15.

## 2015-02-09 NOTE — Telephone Encounter (Signed)
She wants to know if you received a fax with his lab work?

## 2015-02-09 NOTE — Telephone Encounter (Signed)
Returned call to [place] with [facility].We did not receive lab work.Stated she will re fax.

## 2015-02-16 NOTE — Telephone Encounter (Signed)
Returned call to patient no answer.Left message on personal voice mail Dr.Jordan reviewed lab from [place].No changes.Continue same medications.

## 2015-02-21 ENCOUNTER — Ambulatory Visit (HOSPITAL_COMMUNITY): Payer: Medicare Other | Attending: Internal Medicine

## 2015-02-21 ENCOUNTER — Other Ambulatory Visit: Payer: Self-pay

## 2015-02-21 DIAGNOSIS — I483 Typical atrial flutter: Secondary | ICD-10-CM

## 2015-02-21 DIAGNOSIS — I2510 Atherosclerotic heart disease of native coronary artery without angina pectoris: Secondary | ICD-10-CM | POA: Diagnosis not present

## 2015-02-21 DIAGNOSIS — I340 Nonrheumatic mitral (valve) insufficiency: Secondary | ICD-10-CM | POA: Insufficient documentation

## 2015-02-21 DIAGNOSIS — M353 Polymyalgia rheumatica: Secondary | ICD-10-CM | POA: Diagnosis not present

## 2015-02-21 DIAGNOSIS — I358 Other nonrheumatic aortic valve disorders: Secondary | ICD-10-CM | POA: Diagnosis not present

## 2015-02-21 DIAGNOSIS — E785 Hyperlipidemia, unspecified: Secondary | ICD-10-CM

## 2015-02-21 DIAGNOSIS — I071 Rheumatic tricuspid insufficiency: Secondary | ICD-10-CM | POA: Insufficient documentation

## 2015-02-21 DIAGNOSIS — I10 Essential (primary) hypertension: Secondary | ICD-10-CM

## 2015-02-21 DIAGNOSIS — I517 Cardiomegaly: Secondary | ICD-10-CM | POA: Insufficient documentation

## 2015-03-16 DIAGNOSIS — H2589 Other age-related cataract: Secondary | ICD-10-CM | POA: Insufficient documentation

## 2015-03-23 ENCOUNTER — Encounter: Payer: Self-pay | Admitting: Cardiovascular Disease

## 2015-03-23 ENCOUNTER — Encounter: Payer: Self-pay | Admitting: Physician Assistant

## 2015-03-23 ENCOUNTER — Ambulatory Visit
Admission: RE | Admit: 2015-03-23 | Discharge: 2015-03-23 | Disposition: A | Discharge status: Home / Self Care | Payer: Medicare Other | Source: Ambulatory Visit | Attending: Physician Assistant | Admitting: Physician Assistant

## 2015-03-23 ENCOUNTER — Ambulatory Visit (INDEPENDENT_AMBULATORY_CARE_PROVIDER_SITE_OTHER): Payer: Medicare Other | Admitting: Physician Assistant

## 2015-03-23 VITALS — BP 130/90 | HR 60 | Ht 71.0 in | Wt 234.1 lb

## 2015-03-23 DIAGNOSIS — Z7901 Long term (current) use of anticoagulants: Secondary | ICD-10-CM | POA: Diagnosis not present

## 2015-03-23 DIAGNOSIS — I2583 Coronary atherosclerosis due to lipid rich plaque: Secondary | ICD-10-CM

## 2015-03-23 DIAGNOSIS — I483 Typical atrial flutter: Secondary | ICD-10-CM | POA: Diagnosis not present

## 2015-03-23 DIAGNOSIS — Z01818 Encounter for other preprocedural examination: Secondary | ICD-10-CM

## 2015-03-23 DIAGNOSIS — I10 Essential (primary) hypertension: Secondary | ICD-10-CM

## 2015-03-23 DIAGNOSIS — D689 Coagulation defect, unspecified: Secondary | ICD-10-CM

## 2015-03-23 DIAGNOSIS — I2510 Atherosclerotic heart disease of native coronary artery without angina pectoris: Secondary | ICD-10-CM

## 2015-03-23 DIAGNOSIS — I480 Paroxysmal atrial fibrillation: Secondary | ICD-10-CM

## 2015-03-23 LAB — PROTIME-INR
INR: 2.03 — ABNORMAL HIGH (ref <1.50)
PROTHROMBIN TIME: 23.2 s — AB (ref 11.6–15.2)

## 2015-03-23 LAB — BASIC METABOLIC PANEL
BUN: 21 mg/dL (ref 7–25)
CHLORIDE: 109 mmol/L (ref 98–110)
CO2: 23 mmol/L (ref 20–31)
Calcium: 9.3 mg/dL (ref 8.6–10.3)
Creat: 0.86 mg/dL (ref 0.70–1.25)
GLUCOSE: 93 mg/dL (ref 65–99)
POTASSIUM: 4.7 mmol/L (ref 3.5–5.3)
SODIUM: 141 mmol/L (ref 135–146)

## 2015-03-23 LAB — CBC
HEMATOCRIT: 50.8 % (ref 39.0–52.0)
HEMOGLOBIN: 17.2 g/dL — AB (ref 13.0–17.0)
MCH: 32.8 pg (ref 26.0–34.0)
MCHC: 33.9 g/dL (ref 30.0–36.0)
MCV: 96.9 fL (ref 78.0–100.0)
MPV: 9.5 fL (ref 8.6–12.4)
Platelets: 203 10*3/uL (ref 150–400)
RBC: 5.24 MIL/uL (ref 4.22–5.81)
RDW: 13.3 % (ref 11.5–15.5)
WBC: 8.5 10*3/uL (ref 4.0–10.5)

## 2015-03-23 LAB — APTT: APTT: 43 s — AB (ref 24–37)

## 2015-03-23 NOTE — Patient Instructions (Addendum)
Medication Instructions:  Your physician recommends that you continue on your current medications as directed. Please refer to the Current Medication list given to you today.   Labwork: Your physician recommends that you return for lab work in: 7 days before Cardioversion  The lab can be found on the FIRST FLOOR of out building in [address]   Testing/Procedures: 1. Your physician has recommended that you have a Cardioversion (DCCV). Electrical Cardioversion uses a jolt of electricity to your heart either through paddles or wired patches attached to your chest. This is a controlled, usually prescheduled, procedure. Defibrillation is done under light anesthesia in the hospital, and you usually go home the day of the procedure. This is done to get your heart back into a normal rhythm. You are not awake for the procedure. Please see the instruction sheet given to you today.   2. Chest Xray-the chest xray order has already been placed at the [place].   ([place])   Follow-Up: Your physician recommends that you schedule a follow-up appointment in: 2 weeks with Gaspar Bidding post Cardioversion  Clearance for Nerurology and Spine given to patient. Okay to stop Xarelto for 3 days ONLY 30 days after Cardioversion is complete.    Any Other Special Instructions Will Be Listed Below (If Applicable).     If you need a refill on your cardiac medications before your next appointment, please call your pharmacy.

## 2015-03-23 NOTE — Progress Notes (Signed)
Patient ID: William [name], male   DOB: 10/18/1950, 65 y.o.   MRN: [id number]    Date:  03/23/2015   ID:  William [name], DOB [birth year], MRN [id number]  PCP:  [name], MD  Primary Cardiologist:    Chief Complaint  Patient presents with  . Follow-up    in AFIB,  no shortness of breath, no edema,  no pain or cramping in legs, no lightheadedness or dizziness     History of Present Illness: William [name] is a 65 y.o. male with a history of CAD, s/p CABG in 03/1999 (LIMA-LAD, RIMA-OM2, SVG-OM1/OM3, SVG-AM of the RCA), HTN, HL, atrial flutter, PMR. 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 He converted to NSR on his own and Eliquis was eventually d/c'd.  He was  seen at the [facility] clinic and was noted to have an irregular pulse and found to be in atrial flutter. He denied any symptoms. He was seen by primary care and started on Xarelto(CHADS-Vasc score of 3). Metoprolol dose was increased for rate control.  William [name] presents today for one-month follow-up.  He has no complaints other than hip pain for which she needs to get an injection. He continues to remain in atrial flutter with a rate of 60 bpm. He's been compliant with Xarelto..  The patient currently denies nausea, vomiting, fever, chest pain, shortness of breath, orthopnea, dizziness, PND, cough, congestion, abdominal pain, hematochezia, melena, lower extremity edema, claudication.  Wt Readings from Last 3 Encounters:  03/23/15 234 lb 1 oz (106.17 kg)  02/06/15 238 lb 8 oz (108.183 kg)  02/11/14 258 lb (117.028 kg)     Past Medical History  Diagnosis Date  . Coronary disease   . Hypercholesterolemia   . Polymyalgia rheumatica ([place])   . Insomnia   . Tobacco abuse   . Hypertension   . Myocardial infarction ([place])   . Pneumonia   . Atrial flutter ([place]) 09/19/2012  . CMV (cytomegalovirus infection) ([place])     Current Outpatient Prescriptions  Medication Sig Dispense  Refill  . albuterol (PROVENTIL HFA;VENTOLIN HFA) 108 (90 BASE) MCG/ACT inhaler Inhale 1-2 puffs into the lungs every 6 (six) hours as needed for wheezing or shortness of breath. 1 Inhaler 0  . aspirin 81 MG tablet Take 81 mg by mouth daily.    Marland Kitchen atorvastatin (LIPITOR) 20 MG tablet Take 20 mg by mouth daily.    . folic acid (FOLVITE) 1 MG tablet Take 1 mg by mouth daily.    Marland Kitchen lisinopril (PRINIVIL,ZESTRIL) 40 MG tablet Take 40 mg by mouth daily.    . magnesium gluconate (MAGONATE) 500 MG tablet Take 500 mg by mouth every evening.     . methotrexate (RHEUMATREX) 2.5 MG tablet Take 22.5 mg by mouth once a week. Sundays    . metoprolol (LOPRESSOR) 100 MG tablet Take 0.5 tablets (50 mg total) by mouth 2 (two) times daily. 60 tablet 0  . Multiple Vitamin (MULTI-VITAMIN PO) Take 1 tablet by mouth daily.     . nitroGLYCERIN (NITROSTAT) 0.4 MG SL tablet Place 1 tablet (0.4 mg total) under the tongue every 5 (five) minutes as needed for chest pain. 25 tablet 11  . Omega-3 Fatty Acids (FISH OIL) 500 MG CAPS Take 2 capsules by mouth 2 (two) times daily.    Alveda Reasons 20 MG TABS tablet Take 1 tablet by mouth daily. Take 1 tab daily  1  . predniSONE (DELTASONE) 1 MG tablet  Take 1 mg by mouth. Take 2 mg one day and 3mg  the next day alternating days     No current facility-administered medications for this visit.    Allergies:    Allergies  Allergen Reactions  . Oxycodone Itching  . Iodine Itching  . Zocor [Simvastatin] Other (See Comments)    Pain    Social History:  The patient  reports that he has been smoking Cigars.  He has never used smokeless tobacco. He reports that he drinks about 0.6 oz of alcohol per week. He reports that he does not use illicit drugs.   Family history:   Family History  Problem Relation Age of Onset  . Pancreatic cancer Mother   . Colon cancer Mother   . Bladder Cancer Father   . Heart failure Father     ROS:  Please see the history of present illness.  All other  systems reviewed and negative.   PHYSICAL EXAM: VS:  BP 130/90 mmHg  Pulse 60  Ht 5\' 11"  (1.803 m)  Wt 234 lb 1 oz (106.17 kg)  BMI 32.66 kg/m2 Well nourished, well developed, in no acute distress HEENT: Pupils are equal round react to light accommodation extraocular movements are intact.  Neck: no JVDNo cervical lymphadenopathy. Cardiac: Regular rate and rhythm without murmurs rubs or gallops. Lungs:  clear to auscultation bilaterally, no wheezing, rhonchi or rales Abd: soft, nontender, positive bowel sounds all quadrants, no hepatosplenomegaly Ext: no lower extremity edema.  2+ radial and dorsalis pedis pulses. Skin: warm and dry Neuro:  Grossly normal  EKG:  Atrial flutter 4-1 rate 60 bpm   ASSESSMENT AND PLAN:  Problem List Items Addressed This Visit    HTN (hypertension)   CAD (coronary artery disease) - Primary   Atrial flutter [facility])     William [name] will be scheduled for DCCV.  He has been continuous with Xarelto for more than 30 days.  He will then be scheduled for 2 week follow-up after cardioversion for EKG. I signed the form for him to stop Xarelto 30 days after cardioversion, for 3 days, so that he may get his hip injection.  He is to resume it immediately after.  Blood pressure is controlled and he continues on Lipitor for cholesterol.

## 2015-03-24 ENCOUNTER — Other Ambulatory Visit: Payer: Self-pay | Admitting: *Deleted

## 2015-03-24 DIAGNOSIS — Z01810 Encounter for preprocedural cardiovascular examination: Secondary | ICD-10-CM

## 2015-03-27 ENCOUNTER — Encounter: Payer: Self-pay | Admitting: Cardiovascular Disease

## 2015-03-27 MED ORDER — SODIUM CHLORIDE 0.9 % IV SOLN
INTRAVENOUS | Status: DC
  Administered 2015-03-28: 13:00:00 via INTRAVENOUS

## 2015-03-28 ENCOUNTER — Ambulatory Visit (HOSPITAL_COMMUNITY): Payer: Medicare Other | Admitting: Critical Care Medicine

## 2015-03-28 ENCOUNTER — Encounter (HOSPITAL_COMMUNITY)
Admission: RE | Disposition: A | Discharge status: Home / Self Care | Payer: Self-pay | Source: Ambulatory Visit | Attending: Cardiovascular Disease

## 2015-03-28 ENCOUNTER — Encounter (HOSPITAL_COMMUNITY): Payer: Self-pay | Admitting: Cardiovascular Disease

## 2015-03-28 ENCOUNTER — Ambulatory Visit (HOSPITAL_COMMUNITY)
Admission: RE | Admit: 2015-03-28 | Discharge: 2015-03-28 | Disposition: A | Discharge status: Home / Self Care | Payer: Medicare Other | Source: Ambulatory Visit | Attending: Cardiovascular Disease | Admitting: Cardiovascular Disease

## 2015-03-28 DIAGNOSIS — Z7901 Long term (current) use of anticoagulants: Secondary | ICD-10-CM | POA: Insufficient documentation

## 2015-03-28 DIAGNOSIS — I2510 Atherosclerotic heart disease of native coronary artery without angina pectoris: Secondary | ICD-10-CM | POA: Diagnosis not present

## 2015-03-28 DIAGNOSIS — Z6832 Body mass index (BMI) 32.0-32.9, adult: Secondary | ICD-10-CM | POA: Insufficient documentation

## 2015-03-28 DIAGNOSIS — Z79899 Other long term (current) drug therapy: Secondary | ICD-10-CM | POA: Diagnosis not present

## 2015-03-28 DIAGNOSIS — I252 Old myocardial infarction: Secondary | ICD-10-CM | POA: Insufficient documentation

## 2015-03-28 DIAGNOSIS — E669 Obesity, unspecified: Secondary | ICD-10-CM | POA: Insufficient documentation

## 2015-03-28 DIAGNOSIS — F17290 Nicotine dependence, other tobacco product, uncomplicated: Secondary | ICD-10-CM | POA: Insufficient documentation

## 2015-03-28 DIAGNOSIS — Z951 Presence of aortocoronary bypass graft: Secondary | ICD-10-CM | POA: Insufficient documentation

## 2015-03-28 DIAGNOSIS — Z7982 Long term (current) use of aspirin: Secondary | ICD-10-CM | POA: Insufficient documentation

## 2015-03-28 DIAGNOSIS — I4892 Unspecified atrial flutter: Secondary | ICD-10-CM | POA: Insufficient documentation

## 2015-03-28 DIAGNOSIS — I4891 Unspecified atrial fibrillation: Secondary | ICD-10-CM

## 2015-03-28 DIAGNOSIS — M353 Polymyalgia rheumatica: Secondary | ICD-10-CM | POA: Insufficient documentation

## 2015-03-28 DIAGNOSIS — E7800 Pure hypercholesterolemia, unspecified: Secondary | ICD-10-CM | POA: Diagnosis not present

## 2015-03-28 DIAGNOSIS — Z01810 Encounter for preprocedural cardiovascular examination: Secondary | ICD-10-CM

## 2015-03-28 DIAGNOSIS — I10 Essential (primary) hypertension: Secondary | ICD-10-CM | POA: Diagnosis not present

## 2015-03-28 HISTORY — PX: CARDIOVERSION: SHX1299

## 2015-03-28 SURGERY — CARDIOVERSION
Anesthesia: Monitor Anesthesia Care

## 2015-03-28 MED ORDER — HYDROCORTISONE 1 % EX CREA
1.0000 "application " | TOPICAL_CREAM | Freq: Three times a day (TID) | CUTANEOUS | Status: DC | PRN
  Filled 2015-03-28: qty 28

## 2015-03-28 MED ORDER — LIDOCAINE HCL (CARDIAC) 20 MG/ML IV SOLN
INTRAVENOUS | Status: DC | PRN
  Administered 2015-03-28: 60 mg via INTRATRACHEAL

## 2015-03-28 MED ORDER — PROPOFOL 10 MG/ML IV BOLUS
INTRAVENOUS | Status: DC | PRN
  Administered 2015-03-28 (×2): 50 mg via INTRAVENOUS

## 2015-03-28 MED ORDER — SODIUM CHLORIDE 0.9 % IJ SOLN: 3.0000 mL | INTRAMUSCULAR | Status: DC | PRN

## 2015-03-28 MED ORDER — SODIUM CHLORIDE 0.9 % IJ SOLN: 3.0000 mL | Freq: Two times a day (BID) | INTRAMUSCULAR | Status: DC

## 2015-03-28 MED ORDER — SODIUM CHLORIDE 0.9 % IV SOLN: 250.0000 mL | INTRAVENOUS | Status: DC

## 2015-03-28 NOTE — Transfer of Care (Signed)
Immediate Anesthesia Transfer of Care Note  Patient: William [name]  Procedure(s) Performed: Procedure(s): CARDIOVERSION (N/A)  Patient Location: Endoscopy Unit  Anesthesia Type:MAC  Level of Consciousness: awake and alert   Airway & Oxygen Therapy: Patient Spontanous Breathing  Post-op Assessment: Report given to RN, Post -op Vital signs reviewed and stable and Patient moving all extremities  Post vital signs: Reviewed and stable  Last Vitals:  Filed Vitals:   03/28/15 1309  BP: 139/98   HR 61, BP 118/76, RR 21, sats 0000000  Complications: No apparent anesthesia complications

## 2015-03-28 NOTE — Interval H&P Note (Signed)
History and Physical Interval Note:  03/28/2015 8:05 AM  William [name]  has presented today for surgery, with the diagnosis of AFIB  The various methods of treatment have been discussed with the patient and family. After consideration of risks, benefits and other options for treatment, the patient has consented to  Procedure(s): CARDIOVERSION (N/A) as a surgical intervention .  The patient's history has been reviewed, patient examined, no change in status, stable for surgery.  I have reviewed the patient's chart and labs.  Questions were answered to the patient's satisfaction.     Verneda Hollopeter C. Oval Linsey, MD, [facility]  03/28/2015  8:05 AM

## 2015-03-28 NOTE — Discharge Instructions (Signed)
Electrical Cardioversion, Care After °Refer to this sheet in the next few weeks. These instructions provide you with information on caring for yourself after your procedure. Your health care provider may also give you more specific instructions. Your treatment has been planned according to current medical practices, but problems sometimes occur. Call your health care provider if you have any problems or questions after your procedure. °WHAT TO EXPECT AFTER THE PROCEDURE °After your procedure, it is typical to have the following sensations: °· Some redness on the skin where the shocks were delivered. If this is tender, a sunburn lotion or hydrocortisone cream may help. °· Possible return of an abnormal heart rhythm within hours or days after the procedure. °HOME CARE INSTRUCTIONS °· Take medicines only as directed by your health care provider. Be sure you understand how and when to take your medicine. °· Learn how to feel your pulse and check it often. °· Limit your activity for 48 hours after the procedure or as directed by your health care provider. °· Avoid or minimize caffeine and other stimulants as directed by your health care provider. °SEEK MEDICAL CARE IF: °· You feel like your heart is beating too fast or your pulse is not regular. °· You have any questions about your medicines. °· You have bleeding that will not stop. °SEEK IMMEDIATE MEDICAL CARE IF: °· You are dizzy or feel faint. °· It is hard to breathe or you feel short of breath. °· There is a change in discomfort in your chest. °· Your speech is slurred or you have trouble moving an arm or leg on one side of your body. °· You get a serious muscle cramp that does not go away. °· Your fingers or toes turn cold or blue. °  °This information is not intended to replace advice given to you by your health care provider. Make sure you discuss any questions you have with your health care provider. °  °Document Released: 12/09/2012 Document Revised: 03/11/2014  Document Reviewed: 12/09/2012 °Elsevier Interactive Patient Education ©2016 [employer] ° °

## 2015-03-28 NOTE — Anesthesia Postprocedure Evaluation (Signed)
Anesthesia Post Note  Patient: Robben Oelschlager [place]  Procedure(s) Performed: Procedure(s) (LRB): CARDIOVERSION (N/A)  Patient location during evaluation: Endoscopy Anesthesia Type: MAC Level of consciousness: awake and alert Pain management: pain level controlled Vital Signs Assessment: post-procedure vital signs reviewed and stable Respiratory status: spontaneous breathing, nonlabored ventilation, respiratory function stable and patient connected to nasal cannula oxygen Cardiovascular status: stable and blood pressure returned to baseline Anesthetic complications: no    Last Vitals:  Filed Vitals:   03/28/15 1410 03/28/15 1420  BP: 116/78 119/82  Pulse: 57 53  Resp: 14 13    Last Pain: There were no vitals filed for this visit.               Catalina Gravel

## 2015-03-28 NOTE — CV Procedure (Signed)
Electrical Cardioversion Procedure Note BENJI HEFFERN [id number] 15-Apr-1950  Procedure: Electrical Cardioversion Indications:  Atrial Fibrillation  Procedure Details Consent: Risks of procedure as well as the alternatives and risks of each were explained to the (patient/caregiver).  Consent for procedure obtained. Time Out: Verified patient identification, verified procedure, site/side was marked, verified correct patient position, special equipment/implants available, medications/allergies/relevent history reviewed, required imaging and test results available.  Performed  Patient placed on cardiac monitor, pulse oximetry, supplemental oxygen as necessary.  Sedation given: propofol 100 mg Pacer pads placed anterior and posterior chest.  Cardioverted 1 time(s).  Cardioverted at 150J.  Evaluation Findings: Post procedure EKG shows: NSR Complications: None Patient did tolerate procedure well.   Emorie Mcfate C. Oval Linsey, MD, [facility]   03/28/2015, 1:55 PM

## 2015-03-28 NOTE — Anesthesia Preprocedure Evaluation (Addendum)
Anesthesia Evaluation  Patient identified by MRN, date of birth, ID band Patient awake    Reviewed: Allergy & Precautions, NPO status   Airway Mallampati: II  TM Distance: >3 FB Neck ROM: Full    Dental  (+) Dental Advisory Given, Edentulous Upper, Edentulous Lower, Lower Dentures, Upper Dentures   Pulmonary Current Smoker,    Pulmonary exam normal breath sounds clear to auscultation       Cardiovascular hypertension, Pt. on medications and Pt. on home beta blockers + CAD, + Past MI and + CABG  + dysrhythmias Atrial Fibrillation  Rhythm:Irregular Rate:Normal  Echo 02/21/15 - - Left ventricle: The cavity size was normal. Wall thickness wasincreased in a pattern of mild LVH. Systolic function was normal. The estimated ejection fraction was in the range of 55% to 60%. Wall motion was normal; there were no regional wall motion abnormalities. The study is not technically sufficient to allow evaluation of LV diastolic function. - Aortic valve: Trileaflet. Sclerosis without stenosis. There was no regurgitation. - Mitral valve: Structurally normal valve. There was trivial regurgitation. - Left atrium: The atrium was normal in size. - Right atrium: The atrium was mildly dilated. - Tricuspid valve: There was trivial regurgitation. - Pulmonary arteries: PA peak pressure: 20 mm Hg (S). - Inferior vena cava: The vessel was normal in size. The respirophasic diameter changes were in the normal range (= 50%), consistent with normal central venous pressure.   Neuro/Psych negative neurological ROS  negative psych ROS   GI/Hepatic negative GI ROS, Neg liver ROS,   Endo/Other  Obesity   Renal/GU negative Renal ROS     Musculoskeletal negative musculoskeletal ROS (+)   Abdominal   Peds  Hematology  (+) Blood dyscrasia, ,   Anesthesia Other Findings   Reproductive/Obstetrics                            Anesthesia Physical Anesthesia Plan  ASA: III  Anesthesia Plan: MAC   Post-op Pain Management:    Induction: Intravenous  Airway Management Planned: Nasal Cannula  Additional Equipment:   Intra-op Plan:   Post-operative Plan:   Informed Consent: I have reviewed the patients History and Physical, chart, labs and discussed the procedure including the risks, benefits and alternatives for the proposed anesthesia with the patient or authorized representative who has indicated his/her understanding and acceptance.   Dental advisory given  Plan Discussed with: Anesthesiologist and Surgeon  Anesthesia Plan Comments: (Discussed risks/benefits/alternatives to MAC sedation including need for ventilatory support, hypotension, need for conversion to general anesthesia.  All patient questions answered.  Patient wished to proceed.)       Anesthesia Quick Evaluation

## 2015-03-28 NOTE — H&P (View-Only) (Signed)
Patient ID: William [name], male   DOB: 1950/03/21, 65 y.o.   MRN: [id number]    Date:  03/23/2015   ID:  William [name], DOB 10/26/1950, MRN [id number]  PCP:  [name], MD  Primary Cardiologist:    Chief Complaint  Patient presents with  . Follow-up    in AFIB,  no shortness of breath, no edema,  no pain or cramping in legs, no lightheadedness or dizziness     History of Present Illness: William [name] is a 65 y.o. male with a history of CAD, s/p CABG in 03/1999 (LIMA-LAD, RIMA-OM2, SVG-OM1/OM3, SVG-AM of the RCA), HTN, HL, atrial flutter, PMR. 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 He converted to NSR on his own and Eliquis was eventually d/c'd.  He was  seen at the [facility] clinic and was noted to have an irregular pulse and found to be in atrial flutter. He denied any symptoms. He was seen by primary care and started on Xarelto(CHADS-Vasc score of 3). Metoprolol dose was increased for rate control.  William [name] presents today for one-month follow-up.  He has no complaints other than hip pain for which she needs to get an injection. He continues to remain in atrial flutter with a rate of 60 bpm. He's been compliant with Xarelto..  The patient currently denies nausea, vomiting, fever, chest pain, shortness of breath, orthopnea, dizziness, PND, cough, congestion, abdominal pain, hematochezia, melena, lower extremity edema, claudication.  Wt Readings from Last 3 Encounters:  03/23/15 234 lb 1 oz (106.17 kg)  02/06/15 238 lb 8 oz (108.183 kg)  02/11/14 258 lb (117.028 kg)     Past Medical History  Diagnosis Date  . Coronary disease   . Hypercholesterolemia   . Polymyalgia rheumatica ([place])   . Insomnia   . Tobacco abuse   . Hypertension   . Myocardial infarction ([place])   . Pneumonia   . Atrial flutter ([place]) 09/19/2012  . 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 albuterol (PROVENTIL HFA;VENTOLIN HFA) 108 (90 BASE) MCG/ACT inhaler Inhale 1-2 puffs into the lungs every 6 (six) hours as needed for wheezing or shortness of breath. 1 Inhaler 0  . aspirin 81 MG tablet Take 81 mg by mouth daily.    Marland Kitchen atorvastatin (LIPITOR) 20 MG tablet Take 20 mg by mouth daily.    . folic acid (FOLVITE) 1 MG tablet Take 1 mg by mouth daily.    Marland Kitchen lisinopril (PRINIVIL,ZESTRIL) 40 MG tablet Take 40 mg by mouth daily.    . magnesium gluconate (MAGONATE) 500 MG tablet Take 500 mg by mouth every evening.     . methotrexate (RHEUMATREX) 2.5 MG tablet Take 22.5 mg by mouth once a week. Sundays    . metoprolol (LOPRESSOR) 100 MG tablet Take 0.5 tablets (50 mg total) by mouth 2 (two) times daily. 60 tablet 0  . Multiple Vitamin (MULTI-VITAMIN PO) Take 1 tablet by mouth daily.     . nitroGLYCERIN (NITROSTAT) 0.4 MG SL tablet Place 1 tablet (0.4 mg total) under the tongue every 5 (five) minutes as needed for chest pain. 25 tablet 11  . Omega-3 Fatty Acids (FISH OIL) 500 MG CAPS Take 2 capsules by mouth 2 (two) times daily.    Alveda Reasons 20 MG TABS tablet Take 1 tablet by mouth daily. Take 1 tab daily  1  . predniSONE (DELTASONE) 1 MG tablet  Take 1 mg by mouth. Take 2 mg one day and 3mg  the next day alternating days     No current facility-administered medications for this visit.    Allergies:    Allergies  Allergen Reactions  . Oxycodone Itching  . Iodine Itching  . Zocor [Simvastatin] Other (See Comments)    Pain    Social History:  The patient  reports that he has been smoking Cigars.  He has never used smokeless tobacco. He reports that he drinks about 0.6 oz of alcohol per week. He reports that he does not use illicit drugs.   Family history:   Family History  Problem Relation Age of Onset  . Pancreatic cancer Mother   . Colon cancer Mother   . Bladder Cancer Father   . Heart failure Father     ROS:  Please see the history of present illness.  All other  systems reviewed and negative.   PHYSICAL EXAM: VS:  BP 130/90 mmHg  Pulse 60  Ht 5\' 11"  (1.803 m)  Wt 234 lb 1 oz (106.17 kg)  BMI 32.66 kg/m2 Well nourished, well developed, in no acute distress HEENT: Pupils are equal round react to light accommodation extraocular movements are intact.  Neck: no JVDNo cervical lymphadenopathy. Cardiac: Regular rate and rhythm without murmurs rubs or gallops. Lungs:  clear to auscultation bilaterally, no wheezing, rhonchi or rales Abd: soft, nontender, positive bowel sounds all quadrants, no hepatosplenomegaly Ext: no lower extremity edema.  2+ radial and dorsalis pedis pulses. Skin: warm and dry Neuro:  Grossly normal  EKG:  Atrial flutter 4-1 rate 60 bpm   ASSESSMENT AND PLAN:  Problem List Items Addressed This Visit    HTN (hypertension)   CAD (coronary artery disease) - Primary   Atrial flutter [facility])     William [name] will be scheduled for DCCV.  He has been continuous with Xarelto for more than 30 days.  He will then be scheduled for 2 week follow-up after cardioversion for EKG. I signed the form for him to stop Xarelto 30 days after cardioversion, for 3 days, so that he may get his hip injection.  He is to resume it immediately after.  Blood pressure is controlled and he continues on Lipitor for cholesterol.

## 2015-03-29 ENCOUNTER — Encounter (HOSPITAL_COMMUNITY): Payer: Self-pay | Admitting: Cardiovascular Disease

## 2015-03-30 ENCOUNTER — Telehealth: Payer: Self-pay | Admitting: Cardiology

## 2015-03-30 NOTE — Telephone Encounter (Signed)
Returned call to patient.Follow up cardioversion appointment scheduled with Rosaria Ferries PA 04/12/15 at 8:30 am.

## 2015-03-30 NOTE — Telephone Encounter (Signed)
New Message   Pt calling he has Dr.Belwood procedure 03/28/15 and he   is unsure who and when he should have his follow up with  He is requesting Dr. Oval Linsey and or Tarri Fuller  Please let pt know he is a Dr.Jordan pt

## 2015-04-12 ENCOUNTER — Ambulatory Visit (INDEPENDENT_AMBULATORY_CARE_PROVIDER_SITE_OTHER): Payer: Medicare Other | Admitting: Physician Assistant

## 2015-04-12 ENCOUNTER — Encounter: Payer: Self-pay | Admitting: Physician Assistant

## 2015-04-12 VITALS — BP 134/86 | HR 62 | Ht 71.0 in | Wt 234.3 lb

## 2015-04-12 DIAGNOSIS — I4892 Unspecified atrial flutter: Secondary | ICD-10-CM

## 2015-04-12 DIAGNOSIS — Z7901 Long term (current) use of anticoagulants: Secondary | ICD-10-CM | POA: Diagnosis not present

## 2015-04-12 DIAGNOSIS — I2510 Atherosclerotic heart disease of native coronary artery without angina pectoris: Secondary | ICD-10-CM

## 2015-04-12 NOTE — Progress Notes (Signed)
Cardiology Office Note   Date:  04/12/2015   ID:  William [name], DOB [birth year], MRN [id number]  PCP:  [name], MD  Cardiologist:  Dr Martinique  Loyed Wilmes, PA-C   Chief Complaint  Patient presents with  . Follow-up    no chest pain, no shortness of breath, no pain or cramping in legs, no lightheaded or dizziness    History of Present Illness: William [name] is a 65 y.o. male with a history of CABG in 03/1999 (LIMA-LAD, RIMA-OM2, SVG-OM1/OM3, SVG-AM of the RCA), HTN, HL, atrial flutter on Xarelto, PMR.  Seen 01/19 & was in aflutter, had > 4 wks of Xarelto>>DCCV performed 01/24  William [name] presents for post-hospital followup  William [name] has done well since discharge from the hospital. He denies chest pain, shortness of breath, presyncope, weakness or fatigue. Today, he is in atrial flutter with a controlled ventricular rate. He is not aware of that. He has no symptoms that are attributable to the atrial flutter.  He is very interested in ablation as he feels that a cure for the arrhythmia is the best option.  He is having cataract surgery on 02/09, but does not have to hold the Xarelto for that. He can just take it after the procedure. He will not miss any doses.  His mother-in-law has cancer and he and his wife need to go to [place] to help care for her.  He has back problems and significant back pain on a daily basis. He needs an injection for this but was putting it off until after he had been on the Xarelto for 30 days after the cardioversion.  Past Medical History  Diagnosis Date  . Coronary disease 2001    CABG  . Hypercholesterolemia   . Polymyalgia rheumatica ([place])   . Insomnia   . Tobacco abuse   . Hypertension   . Myocardial infarction ([place])   . Pneumonia   . Atrial flutter ([place]) 09/19/2012  . 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 Cardiac catheterization  03/16/1999    ejection  fraction 65%--After surgery pt. developed hives and itching  . Coronary artery bypass graft  03/19/1999    LIMA-LAD, RIMA-OM2, SVG-OM1/OM3, SVG-AM  . Trigger finger release    . Cardioversion N/A 03/28/2015    Procedure: CARDIOVERSION;  Surgeon: Skeet Latch, MD;  Location: [facility];  Service: Cardiovascular;  Laterality: N/A;    Current Outpatient Prescriptions  Medication Sig Dispense Refill  . acetaminophen (TYLENOL) 500 MG tablet Take 500 mg by mouth as needed.    Marland Kitchen albuterol (PROVENTIL HFA;VENTOLIN HFA) 108 (90 BASE) MCG/ACT inhaler Inhale 1-2 puffs into the lungs every 6 (six) hours as needed for wheezing or shortness of breath. 1 Inhaler 0  . atorvastatin (LIPITOR) 20 MG tablet Take 20 mg by mouth daily.    . folic acid (FOLVITE) 1 MG tablet Take 1 mg by mouth daily.    Marland Kitchen lisinopril (PRINIVIL,ZESTRIL) 40 MG tablet Take 40 mg by mouth daily.    . magnesium gluconate (MAGONATE) 500 MG tablet Take 500 mg by mouth every evening.     . methotrexate (RHEUMATREX) 2.5 MG tablet Take 22.5 mg by mouth once a week. Sundays    . metoprolol (LOPRESSOR) 100 MG tablet Take 0.5 tablets (50 mg total) by mouth 2 (two) times daily. 60 tablet 0  . Multiple Vitamin (MULTI-VITAMIN PO) Take 1 tablet by mouth daily.     Marland Kitchen  nitroGLYCERIN (NITROSTAT) 0.4 MG SL tablet Place 1 tablet (0.4 mg total) under the tongue every 5 (five) minutes as needed for chest pain. 25 tablet 11  . Omega-3 Fatty Acids (FISH OIL) 500 MG CAPS Take 2 capsules by mouth 2 (two) times daily.    . predniSONE (DELTASONE) 1 MG tablet Take 1 mg by mouth. Take 2 mg one day and 3mg  the next day alternating days    . XARELTO 20 MG TABS tablet Take 1 tablet by mouth daily. Take 1 tab daily  1   No current facility-administered medications for this visit.    Allergies:   Oxycodone; Iodine; and Zocor    Social History:  The patient  reports that he has been smoking Cigars.  He has never used smokeless tobacco. He reports that he drinks  about 0.6 oz of alcohol per week. He reports that he does not use illicit drugs.   Family History:  The patient's family history includes Bladder Cancer in his father; Colon cancer in his mother; Heart failure in his father; Pancreatic cancer in his mother.    ROS:  Please see the history of present illness. All other systems are reviewed and negative.    PHYSICAL EXAM: VS:  BP 134/86 mmHg  Pulse 62  Ht 5\' 11"  (1.803 m)  Wt 234 lb 5 oz (106.283 kg)  BMI 32.69 kg/m2 , BMI Body mass index is 32.69 kg/(m^2). GEN: Well nourished, well developed, male in no acute distress HEENT: normal for age  Neck: no JVD, no carotid bruit, no masses Cardiac: Slightly irregular rate and rhythm; no murmur, no rubs, or gallops Respiratory:  clear to auscultation bilaterally, normal work of breathing GI: soft, nontender, nondistended, + BS MS: no deformity or atrophy; no edema; distal pulses are 2+ in all 4 extremities  Skin: warm and dry, no rash Neuro:  Strength and sensation are intact Psych: euthymic mood, full affect   EKG:  EKG is ordered today. The ekg ordered today demonstrates atrial flutter, ventricular rate 62 bpm, no acute ischemic changes   Recent Labs: 03/23/2015: BUN 21; Creat 0.86; Hemoglobin 17.2*; Platelets 203; Potassium 4.7; Sodium 141    Lipid Panel No results found for: CHOL, TRIG, HDL, CHOLHDL, VLDL, LDLCALC, LDLDIRECT   Wt Readings from Last 3 Encounters:  04/12/15 234 lb 5 oz (106.283 kg)  03/28/15 234 lb (106.142 kg)  03/23/15 234 lb 1 oz (106.17 kg)     Other studies Reviewed: Additional studies/ records that were reviewed today include: Hospital records and office visits.  ASSESSMENT AND PLAN:  1.  Atrial flutter: William. [name] is very interested in ablation as he feels a cure is the best option. We will make a referral to EP, first available appointment.   He is aware that he cannot have the injections in his back which would require holding the Xarelto for 3  days, until 30 days after the cardioversion or for 30 days after the ablation if that is scheduled. He is okay with that. He is interested in getting the procedure done as soon as possible so he can get his wife to [place] to help care for her mother.  2. Chronic anticoagulation-Xarelto: CHADSVASC = 3. He is compliant with the Xarelto and has not missed any doses. Tomorrow's dose will have to be taken a little bit later than normal because of the cataract surgery, but he will not miss any doses.  3. Back issues: He is agreeable to deferring the injections for his  back until cardiology has said it is acceptable to hold the Xarelto for 3 days.   Current medicines are reviewed at length with the patient today.  The patient does not have concerns regarding medicines.  The following changes have been made:  no change  Labs/ tests ordered today include:   ECG   Disposition:   FU with Dr Martinique  Signed, Lenoard Aden  04/12/2015 9:[address], [place], [place]  [address] Phone: [phone]; Fax: [phone]

## 2015-04-12 NOTE — Patient Instructions (Signed)
Continue all current medications. Referral to Electrophysiology (EP) - [facility]. Follow up in  3 months with Dr. Martinique.

## 2015-04-20 ENCOUNTER — Ambulatory Visit (INDEPENDENT_AMBULATORY_CARE_PROVIDER_SITE_OTHER): Payer: Medicare Other | Admitting: Internal Medicine

## 2015-04-20 ENCOUNTER — Encounter: Payer: Self-pay | Admitting: Internal Medicine

## 2015-04-20 VITALS — BP 130/84 | HR 69 | Ht 71.0 in | Wt 232.2 lb

## 2015-04-20 DIAGNOSIS — I4892 Unspecified atrial flutter: Secondary | ICD-10-CM

## 2015-04-20 NOTE — Patient Instructions (Signed)
Medication Instructions: - Your physician recommends that you continue on your current medications as directed. Please refer to the Current Medication list given to you today.  Labwork: - pending procedure date  Procedures/Testing: - Your physician has recommended that you have an ablation. Catheter ablation is a medical procedure used to treat some cardiac arrhythmias (irregular heartbeats). During catheter ablation, a long, thin, flexible tube is put into a blood vessel in your groin (upper thigh), or neck. This tube is called an ablation catheter. It is then guided to your heart through the blood vessel. Radio frequency waves destroy small areas of heart tissue where abnormal heartbeats may cause an arrhythmia to start. Nira Conn, RN for Dr. Caryl Comes will call you to schedule- for April.  Follow-Up: - pending procedure date  Any Additional Special Instructions Will Be Listed Below (If Applicable).     If you need a refill on your cardiac medications before your next appointment, please call your pharmacy.

## 2015-04-20 NOTE — Progress Notes (Signed)
ELECTROPHYSIOLOGY CONSULT NOTE  Patient ID: William [name], MRN: [id number], DOB/AGE: January 31, 1951 65 y.o. Admit date: (Not on file) Date of Consult: 04/20/2015  Primary Physician: [name], MD Primary Cardiologist:[facility] Consulting Physician PJ  Chief Complaint: atrial flutter   HPI William [name] is a 65 y.o. male  Referred for consideration of atrial flutter ablation  this was re-detected this fall. He had no attributable symptoms. He was started on Rivaroxaban  and was admitted for cardioversion. He then had rapid reversion to atrial flutter.  He has no clearly attributable symptoms. He denies palpitations  He has noted no change in exercise performance and denies transient neurological symptoms  CHADS-VASc score is 3 (hypertension, coronary artery disease, age).  He was initially found to be in atrial flutter 7/14 in the context of a infection with CMV. He was treated for short period of time with anticoagulation and then this was discontinued.  He has a history of coronary artery disease with bypass surgery 2001. Myoview scan 2012 demonstrated inferior infarct with peri-infarct ischemia EF 61%  Other health issues include polymyalgia rheumatica rheumatoid arthritis and significant left hip pain    Past Medical History  Diagnosis Date  . Coronary disease 2001    CABG  . Hypercholesterolemia   . Polymyalgia rheumatica ([place])   . Insomnia   . Tobacco abuse   . Hypertension   . Myocardial infarction ([place])   . Pneumonia   . Atrial flutter ([place]) 09/19/2012  . 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 Cardiac catheterization  03/16/1999    ejection fraction 65%--After surgery pt. developed hives and itching  . Coronary artery bypass graft  03/19/1999    LIMA-LAD, RIMA-OM2, SVG-OM1/OM3, SVG-AM  . Trigger finger release    . Cardioversion N/A 03/28/2015    Procedure: CARDIOVERSION;   Surgeon: Skeet Latch, MD;  Location: [facility];  Service: Cardiovascular;  Laterality: N/A;     Home Meds: Prior to Admission medications   Medication Sig Start Date End Date Taking? Authorizing Provider  acetaminophen (TYLENOL) 500 MG tablet Take 500 mg by mouth as needed.   Yes Historical Provider, MD  albuterol (PROVENTIL HFA;VENTOLIN HFA) 108 (90 BASE) MCG/ACT inhaler Inhale 1-2 puffs into the lungs every 6 (six) hours as needed for wheezing or shortness of breath. 02/04/13  Yes [name] Joylene Draft, PA-C  atorvastatin (LIPITOR) 20 MG tablet Take 20 mg by mouth daily.   Yes Historical Provider, MD  folic acid (FOLVITE) 1 MG tablet Take 1 mg by mouth daily.   Yes Historical Provider, MD  lisinopril (PRINIVIL,ZESTRIL) 40 MG tablet Take 40 mg by mouth daily.   Yes Historical Provider, MD  magnesium gluconate (MAGONATE) 500 MG tablet Take 500 mg by mouth every evening.    Yes Historical Provider, MD  methotrexate (RHEUMATREX) 2.5 MG tablet Take 22.5 mg by mouth once a week. Sundays   Yes Historical Provider, MD  metoprolol (LOPRESSOR) 100 MG tablet Take 0.5 tablets (50 mg total) by mouth 2 (two) times daily. 02/04/13  Yes Liliane Shi, PA-C  Multiple Vitamin (MULTI-VITAMIN PO) Take 1 tablet by mouth daily.    Yes Historical Provider, MD  nitroGLYCERIN (NITROSTAT) 0.4 MG SL tablet Place 1 tablet (0.4 mg total) under the tongue every 5 (five) minutes as needed for chest pain. 02/04/13  Yes Liliane Shi, PA-C  Omega-3 Fatty Acids (FISH OIL) 500 MG CAPS  Take 2 capsules by mouth 2 (two) times daily.   Yes Historical Provider, MD  predniSONE (DELTASONE) 1 MG tablet Take 1 mg by mouth. Take 2 mg one day and 3mg  the next day alternating days   Yes Historical Provider, MD  XARELTO 20 MG TABS tablet Take 1 tablet by mouth daily. Take 1 tab daily 02/03/15  Yes Historical Provider, MD    Allergies:  Allergies  Allergen Reactions  . Oxycodone Itching  . Iodine Itching  . Zocor [Simvastatin] Other (See  Comments)    Pain    Social History   Social History  . Marital Status: Married    Spouse Name: N/A  . Number of Children: N/A  . Years of Education: N/A   Occupational History  .      Retired   Social History Main Topics  . Smoking status: Current Some Day Smoker    Types: Cigars  . Smokeless tobacco: Never Used  . Alcohol Use: 0.6 oz/week    1 Cans of beer per week     Comment: Occasional beer  . Drug Use: No  . Sexual Activity: Not on file   Other Topics Concern  . Not on file   Social History Narrative     Family History  Problem Relation Age of Onset  . Pancreatic cancer Mother   . Colon cancer Mother   . Bladder Cancer Father   . Heart failure Father      ROS:  Please see the history of present illness.     All other systems reviewed and negative.    Physical Exam * Blood pressure 130/84, pulse 69, height 5\' 11"  (1.803 m), weight 232 lb 3.2 oz (105.325 kg). General: Well developed, well nourished male in no acute distress. Head: Normocephalic, atraumatic, sclera non-icteric, no xanthomas, nares are without discharge. EENT: normal  Lymph Nodes:  none Neck: Negative for carotid bruits. JVD not elevated. Back:without scoliosis kyphosis  Lungs: Clear bilaterally to auscultation without wheezes, rales, or rhonchi. Breathing is unlabored. Heart: RRR with S1 S2. No  murmur . No rubs, or gallops appreciated. Abdomen: Soft, non-tender, non-distended with normoactive bowel sounds. No hepatomegaly. No rebound/guarding. No obvious abdominal masses. Msk:  Strength and tone appear normal for age. Extremities: No clubbing or cyanosis. No  edema.  Distal pedal pulses are 2+ and equal bilaterally. Skin: Warm and Dry Neuro: Alert and oriented X 3. CN III-XII intact Grossly normal sensory and motor function . Psych:  Responds to questions appropriately with a normal affect.      Labs: Cardiac Enzymes No results for input(s): CKTOTAL, CKMB, TROPONINI in the last 72  hours. CBC Lab Results  Component Value Date   WBC 8.5 03/23/2015   HGB 17.2* 03/23/2015   HCT 50.8 03/23/2015   MCV 96.9 03/23/2015   PLT 203 03/23/2015   PROTIME: No results for input(s): LABPROT, INR in the last 72 hours. Chemistry No results for input(s): NA, K, CL, CO2, BUN, CREATININE, CALCIUM, PROT, BILITOT, ALKPHOS, ALT, AST, GLUCOSE in the last 168 hours.  Invalid input(s): LABALBU Lipids No results found for: CHOL, HDL, LDLCALC, TRIG BNP No results found for: PROBNP Thyroid Function Tests: No results for input(s): TSH, T4TOTAL, T3FREE, THYROIDAB in the last 72 hours.  Invalid input(s): FREET3 Miscellaneous No results found for: DDIMER  Radiology/Studies:  Dg Chest 2 View  03/23/2015  CLINICAL DATA:  Pre-procedure, cardioversion 03/28/2015 EXAM: CHEST  2 VIEW COMPARISON:  09/18/2012 FINDINGS: There is no focal parenchymal opacity.  There is no pleural effusion or pneumothorax. The heart and mediastinal contours are unremarkable. There is evidence of prior CABG. The osseous structures are unremarkable. IMPRESSION: No active cardiopulmonary disease. Electronically Signed   By: Kathreen Devoid   On: 03/23/2015 13:19    EKG: Atrial flutter-atypical atrial flutter cycle length 260 ms  Intervals-/10/38   Assessment and Plan:    Atrial flutter-recurrent-atypical  Coronary artery disease status post CABG  Hypertension  Polymyalgia rheumatica/rheumatoid arthritis/left hip pain   The patient has typical atrial flutter and is a reasonable candidate for catheter ablation as primary therapy. He has not had atrial fibrillation so the possibility of discontinuing anticoagulation following the procedure is reasonable  He has scheduled a steroid injection of his left hip next week or so and for this he will need to stop his anticoagulation. He is then scheduled to go to [place] with his wife. We will plan to undertake catheter ablation in April. We have reviewed risks and  benefits including but not limited to perforation and bleeding. It will be done under general anesthesia. Virl Axe

## 2015-04-21 ENCOUNTER — Telehealth: Payer: Self-pay | Admitting: Internal Medicine

## 2015-04-21 DIAGNOSIS — I483 Typical atrial flutter: Secondary | ICD-10-CM

## 2015-04-21 NOTE — Telephone Encounter (Signed)
New message     Patient calling to set up ablation.  Would like a call back today

## 2015-04-21 NOTE — Telephone Encounter (Signed)
Patient had three dates from Dr. Caryl Comes  Patient would like to have done April 7th Dr. Caryl Comes told patient that it was going to be done by another of his colleagues but he couldn't recall which one.  Routing to [place] to set up ablation when she returns to work on Monday.

## 2015-04-24 NOTE — Telephone Encounter (Signed)
I spoke with the patient. He is scheduled for his a-flutter ablation with Dr. Curt Bears on 06/09/15. Instructions will be mailed to the patient. Mailing address confirmed with the patient.

## 2015-05-10 ENCOUNTER — Telehealth: Payer: Self-pay | Admitting: Internal Medicine

## 2015-05-10 NOTE — Telephone Encounter (Signed)
New Message  Pt wife called request a call back to discuss the resch of the ablation

## 2015-05-10 NOTE — Telephone Encounter (Signed)
Informed wife (Ok per [employer] on file) of procedure date/time changes and office visit scheduled for 4/6. Patient's wife verbalized understanding and agreeable to plan.

## 2015-06-07 ENCOUNTER — Other Ambulatory Visit: Payer: Medicare Other

## 2015-06-07 NOTE — Progress Notes (Signed)
Cardiology Office Note Date:  06/08/2015  Patient ID:  William [name], William [name] 1950-09-09, MRN [id number] PCP:  William Rim, MD  Cardiologist:  William [name] Electrophysiologist: William [name]   Chief Complaint: pre-procedure evaluation  History of Present Illness: William [name] is a 65 y.o. male with history of PAFlutter, CAD (CABG2001),  HTN, polymyalgia rheumatica, and RA, [name] to the office today seen for William [name], who is scheduled for EPS/ablation procedure on 06/15/15.  He was last seen by William [name] in Feb, at that time they discussed EPS ablation (risk/benefits discussed) and he wanted to proceed but had plans for steroid injections for his hip pain and needed to interrupt his a/c for this and had travel plans as well.  He [name] today tin preparation for ablation procedure.  He [name] today feeling well, has not had any kind of CP, palpitations or SOB, no dizziness, near syncope or syncope.  He is unaware of the Aflutter.  He has not had any bleeding or signs of bleeding with the xarelto.  He reports that he has not missed any doses in the last 4 weeks or longer.   AFlutter hx: Diagnosed July 2014 during an acute infection treated briefly with a/c Recurrent Aflutter late 2016 started on Xarelto and BB up-titrated Jan 2017 DCCV with ERAFlutter He has not been noted to have AFib   Past Medical History  Diagnosis Date  . Coronary disease 2001    CABG  . Hypercholesterolemia   . Polymyalgia rheumatica ([place])   . Insomnia   . Tobacco abuse   . Hypertension   . Myocardial infarction ([place])   . Pneumonia   . Atrial flutter ([place]) 09/19/2012  . CMV (cytomegalovirus infection) ([place])     Past Surgical History  Procedure Laterality Date  . Cardiac catheterization  03/16/1999    ejection fraction 65%--After surgery pt. developed hives and itching  . Coronary artery bypass graft  03/19/1999    LIMA-LAD, RIMA-OM2, SVG-OM1/OM3, SVG-AM  . Trigger finger release    . Cardioversion N/A  03/28/2015    Procedure: CARDIOVERSION;  Surgeon: Skeet Latch, MD;  Location: [facility];  Service: Cardiovascular;  Laterality: N/A;    Current Outpatient Prescriptions  Medication Sig Dispense Refill  . acetaminophen (TYLENOL) 500 MG tablet Take 500 mg by mouth as needed.    Marland Kitchen albuterol (PROVENTIL HFA;VENTOLIN HFA) 108 (90 BASE) MCG/ACT inhaler Inhale 1-2 puffs into the lungs every 6 (six) hours as needed for wheezing or shortness of breath. 1 Inhaler 0  . atorvastatin (LIPITOR) 20 MG tablet Take 20 mg by mouth daily.    . folic acid (FOLVITE) 1 MG tablet Take 1 mg by mouth daily.    Marland Kitchen lisinopril (PRINIVIL,ZESTRIL) 40 MG tablet Take 40 mg by mouth daily.    . magnesium gluconate (MAGONATE) 500 MG tablet Take 500 mg by mouth every evening.     . methotrexate (RHEUMATREX) 2.5 MG tablet Take 22.5 mg by mouth once a week. Sundays    . metoprolol (LOPRESSOR) 100 MG tablet Take 0.5 tablets (50 mg total) by mouth 2 (two) times daily. 60 tablet 0  . Multiple Vitamin (MULTI-VITAMIN PO) Take 1 tablet by mouth daily.     . nitroGLYCERIN (NITROSTAT) 0.4 MG SL tablet Place 1 tablet (0.4 mg total) under the tongue every 5 (five) minutes as needed for chest pain. 25 tablet 11  . Omega-3 Fatty Acids (FISH OIL) 500 MG CAPS Take 2 capsules by mouth 2 (two) times daily.    Marland Kitchen  predniSONE (DELTASONE) 1 MG tablet Take 1 mg by mouth daily with breakfast.     . XARELTO 20 MG TABS tablet Take 1 tablet by mouth daily. Take 1 tab daily  1   No current facility-administered medications for this visit.    Allergies:   Oxycodone; Iodine; and Zocor   Social History:  The patient  reports that he has been smoking Cigars.  He has never used smokeless tobacco. He reports that he drinks about 0.6 oz of alcohol per week. He reports that he does not use illicit drugs.   Family History:  The patient's family history includes Bladder Cancer in his father; Colon cancer in his mother; Heart failure in his father;  Pancreatic cancer in his mother.  ROS:  Please see the history of present illness.    All other systems are reviewed and otherwise negative.   PHYSICAL EXAM:  VS:  BP 148/100 mmHg  Pulse 59  Ht 5\' 11"  (1.803 m)  Wt 229 lb (103.874 kg)  BMI 31.95 kg/m2 BMI: Body mass index is 31.95 kg/(m^2). Well nourished, well developed, in no acute distress HEENT: normocephalic, atraumatic Neck: no JVD, carotid bruits or masses Cardiac:  Irregular RR; no significant murmurs, no rubs, or gallops Lungs:  clear to auscultation bilaterally, no wheezing, rhonchi or rales Abd: soft, nontender MS: no deformity or atrophy Ext: no edema Skin: warm and dry, no rash Neuro:  No gross deficits appreciated Psych: euthymic mood, full affect  EKG:  Done today shows and reviewed by myself is AFlutter, 59bpm  02/21/15: Echocardiogram Study Conclusions - Left ventricle: The cavity size was normal. Wall thickness was  increased in a pattern of mild LVH. Systolic function was normal.  The estimated ejection fraction was in the range of 55% to 60%.  Wall motion was normal; there were no regional wall motion  abnormalities. The study is not technically sufficient to allow  evaluation of LV diastolic function. - Aortic valve: Trileaflet. Sclerosis without stenosis. There was  no regurgitation. - Mitral valve: Structurally normal valve. There was trivial  regurgitation. - Left atrium: The atrium was normal in size. - Right atrium: The atrium was mildly dilated. - Tricuspid valve: There was trivial regurgitation. - Pulmonary arteries: PA peak pressure: 20 mm Hg (S). - Inferior vena cava: The vessel was normal in size. The  respirophasic diameter changes were in the normal range (= 50%),  consistent with normal central venous pressure. Impressions: - Compared to the prior echo in 2014, there are no significant  changes.   Recent Labs: 03/23/2015: BUN 21; Creat 0.86; Hemoglobin 17.2*; Platelets  203; Potassium 4.7; Sodium 141  No results found for requested labs within last 365 days.    Wt Readings from Last 3 Encounters:  06/08/15 229 lb (103.874 kg)  04/20/15 232 lb 3.2 oz (105.325 kg)  04/12/15 234 lb 5 oz (106.283 kg)     Other studies reviewed: Additional studies/records reviewed today include: summarized above  ASSESSMENT AND PLAN:  1. AFlutter (typical)    CHA2DS2Vasc is at least 3, on Xarelto    The patient was seen today by William [name] as well, risks/benefts of the procedure were re-discussed and the patient would like to proceed.       2. CAD     no CP     Follows with William [name]     On BB/ACE, statin tx, no ASA with his xarelto  3. HTN     Unusually high for him, he  states BP is generally controlled  Disposition: pre-op labs today, F/u with William [name] 4 weeks post procedure.  He is asked to monitor his BP at home.  Do not hold Xarelto, the patient is instructed and understands.  Current medicines are reviewed at length with the patient today.  The patient did not have any concerns regarding medicines.  Haywood Lasso, PA-C 06/08/2015 9:37 AM     [place] [address] [phone] (office)  [phone] (fax)

## 2015-06-08 ENCOUNTER — Encounter: Payer: Self-pay | Admitting: *Deleted

## 2015-06-08 ENCOUNTER — Other Ambulatory Visit (INDEPENDENT_AMBULATORY_CARE_PROVIDER_SITE_OTHER): Payer: Medicare Other | Admitting: *Deleted

## 2015-06-08 ENCOUNTER — Encounter: Payer: Self-pay | Admitting: Physician Assistant

## 2015-06-08 ENCOUNTER — Ambulatory Visit (INDEPENDENT_AMBULATORY_CARE_PROVIDER_SITE_OTHER): Payer: Medicare Other | Admitting: Physician Assistant

## 2015-06-08 ENCOUNTER — Encounter (HOSPITAL_COMMUNITY): Payer: Self-pay | Admitting: Pharmacy Technician

## 2015-06-08 VITALS — BP 148/100 | HR 59 | Ht 71.0 in | Wt 229.0 lb

## 2015-06-08 DIAGNOSIS — I2510 Atherosclerotic heart disease of native coronary artery without angina pectoris: Secondary | ICD-10-CM | POA: Diagnosis not present

## 2015-06-08 DIAGNOSIS — I483 Typical atrial flutter: Secondary | ICD-10-CM | POA: Diagnosis not present

## 2015-06-08 DIAGNOSIS — I4892 Unspecified atrial flutter: Secondary | ICD-10-CM

## 2015-06-08 DIAGNOSIS — I10 Essential (primary) hypertension: Secondary | ICD-10-CM | POA: Diagnosis not present

## 2015-06-08 LAB — CBC WITH DIFFERENTIAL/PLATELET
Basophils Absolute: 0 cells/uL (ref 0–200)
Basophils Relative: 0 %
EOS PCT: 3 %
Eosinophils Absolute: 282 cells/uL (ref 15–500)
HCT: 50.4 % — ABNORMAL HIGH (ref 38.5–50.0)
HEMOGLOBIN: 17.2 g/dL — AB (ref 13.2–17.1)
LYMPHS ABS: 2444 {cells}/uL (ref 850–3900)
Lymphocytes Relative: 26 %
MCH: 33 pg (ref 27.0–33.0)
MCHC: 34.1 g/dL (ref 32.0–36.0)
MCV: 96.7 fL (ref 80.0–100.0)
MONOS PCT: 13 %
MPV: 9.3 fL (ref 7.5–12.5)
Monocytes Absolute: 1222 cells/uL — ABNORMAL HIGH (ref 200–950)
NEUTROS ABS: 5452 {cells}/uL (ref 1500–7800)
NEUTROS PCT: 58 %
Platelets: 202 10*3/uL (ref 140–400)
RBC: 5.21 MIL/uL (ref 4.20–5.80)
RDW: 13.9 % (ref 11.0–15.0)
WBC: 9.4 10*3/uL (ref 3.8–10.8)

## 2015-06-08 LAB — BASIC METABOLIC PANEL
BUN: 21 mg/dL (ref 7–25)
CALCIUM: 9.4 mg/dL (ref 8.6–10.3)
CO2: 26 mmol/L (ref 20–31)
CREATININE: 0.87 mg/dL (ref 0.70–1.25)
Chloride: 105 mmol/L (ref 98–110)
Glucose, Bld: 89 mg/dL (ref 65–99)
Potassium: 4.3 mmol/L (ref 3.5–5.3)
SODIUM: 138 mmol/L (ref 135–146)

## 2015-06-08 NOTE — Patient Instructions (Signed)
Medication Instructions:   Your physician recommends that you continue on your current medications as directed. Please refer to the Current Medication list given to you today.    If you need a refill on your cardiac medications before your next appointment, please call your pharmacy.  Labwork:  NONE ORDER TODAY    Testing/Procedures: SEE LETTER FOR SVT ABLATION 06/15/15 WITH CAMNITZ   Follow-Up: IN 4 WEEKS WITH DR CAMNITZ   Any Other Special Instructions Will Be Listed Below (If Applicable).

## 2015-06-08 NOTE — Addendum Note (Signed)
Addended by: Eulis Foster on: 06/08/2015 08:42 AM   Modules accepted: Orders

## 2015-06-09 ENCOUNTER — Other Ambulatory Visit: Payer: Self-pay | Admitting: Physician Assistant

## 2015-06-15 ENCOUNTER — Encounter (HOSPITAL_COMMUNITY): Payer: Self-pay | Admitting: *Deleted

## 2015-06-15 ENCOUNTER — Ambulatory Visit (HOSPITAL_COMMUNITY)
Admission: RE | Admit: 2015-06-15 | Discharge: 2015-06-16 | Disposition: A | Discharge status: Home / Self Care | Payer: Medicare Other | Source: Ambulatory Visit | Attending: Cardiology | Admitting: Cardiology

## 2015-06-15 ENCOUNTER — Encounter (HOSPITAL_COMMUNITY)
Admission: RE | Disposition: A | Discharge status: Home / Self Care | Payer: Self-pay | Source: Ambulatory Visit | Attending: Cardiology

## 2015-06-15 DIAGNOSIS — I4892 Unspecified atrial flutter: Secondary | ICD-10-CM | POA: Diagnosis present

## 2015-06-15 DIAGNOSIS — I10 Essential (primary) hypertension: Secondary | ICD-10-CM | POA: Diagnosis not present

## 2015-06-15 DIAGNOSIS — M069 Rheumatoid arthritis, unspecified: Secondary | ICD-10-CM | POA: Diagnosis not present

## 2015-06-15 DIAGNOSIS — M353 Polymyalgia rheumatica: Secondary | ICD-10-CM | POA: Insufficient documentation

## 2015-06-15 DIAGNOSIS — I2510 Atherosclerotic heart disease of native coronary artery without angina pectoris: Secondary | ICD-10-CM | POA: Diagnosis not present

## 2015-06-15 DIAGNOSIS — I483 Typical atrial flutter: Secondary | ICD-10-CM | POA: Diagnosis not present

## 2015-06-15 DIAGNOSIS — E785 Hyperlipidemia, unspecified: Secondary | ICD-10-CM | POA: Insufficient documentation

## 2015-06-15 DIAGNOSIS — Z7901 Long term (current) use of anticoagulants: Secondary | ICD-10-CM | POA: Insufficient documentation

## 2015-06-15 HISTORY — PX: ELECTROPHYSIOLOGIC STUDY: SHX172A

## 2015-06-15 SURGERY — A-FLUTTER/A-TACH/SVT ABLATION
Anesthesia: LOCAL

## 2015-06-15 MED ORDER — METHOTREXATE (ANTI-RHEUMATIC) 2.5 MG PO TABS: 22.5000 mg | ORAL_TABLET | ORAL | Status: DC

## 2015-06-15 MED ORDER — SODIUM CHLORIDE 0.9 % IV SOLN: 250.0000 mL | INTRAVENOUS | Status: DC | PRN

## 2015-06-15 MED ORDER — HEPARIN (PORCINE) IN NACL 2-0.9 UNIT/ML-% IJ SOLN
INTRAMUSCULAR | Status: DC | PRN
  Administered 2015-06-15: 15:00:00

## 2015-06-15 MED ORDER — ACETAMINOPHEN 500 MG PO TABS: 500.0000 mg | ORAL_TABLET | Freq: Four times a day (QID) | ORAL | Status: DC | PRN

## 2015-06-15 MED ORDER — MIDAZOLAM HCL 5 MG/5ML IJ SOLN
INTRAMUSCULAR | Status: AC
  Filled 2015-06-15: qty 5

## 2015-06-15 MED ORDER — HYDRALAZINE HCL 20 MG/ML IJ SOLN: 10.0000 mg | INTRAMUSCULAR | Status: DC | PRN

## 2015-06-15 MED ORDER — ALBUTEROL SULFATE (2.5 MG/3ML) 0.083% IN NEBU: 2.5000 mg | INHALATION_SOLUTION | Freq: Four times a day (QID) | RESPIRATORY_TRACT | Status: DC | PRN

## 2015-06-15 MED ORDER — METOPROLOL TARTRATE 50 MG PO TABS
50.0000 mg | ORAL_TABLET | Freq: Two times a day (BID) | ORAL | Status: DC
  Administered 2015-06-15: 50 mg via ORAL
  Filled 2015-06-15: qty 1

## 2015-06-15 MED ORDER — FOLIC ACID 1 MG PO TABS: 1.0000 mg | ORAL_TABLET | Freq: Every day | ORAL | Status: DC

## 2015-06-15 MED ORDER — ACETAMINOPHEN 325 MG PO TABS
ORAL_TABLET | ORAL | Status: AC
  Filled 2015-06-15: qty 2

## 2015-06-15 MED ORDER — FENTANYL CITRATE (PF) 100 MCG/2ML IJ SOLN
INTRAMUSCULAR | Status: AC
  Filled 2015-06-15: qty 2

## 2015-06-15 MED ORDER — HYDRALAZINE HCL 20 MG/ML IJ SOLN
INTRAMUSCULAR | Status: AC
  Filled 2015-06-15: qty 1

## 2015-06-15 MED ORDER — SODIUM CHLORIDE 0.9% FLUSH
3.0000 mL | Freq: Two times a day (BID) | INTRAVENOUS | Status: DC
  Administered 2015-06-15: 3 mL via INTRAVENOUS

## 2015-06-15 MED ORDER — LISINOPRIL 40 MG PO TABS: 40.0000 mg | ORAL_TABLET | Freq: Every day | ORAL | Status: DC

## 2015-06-15 MED ORDER — BUPIVACAINE HCL (PF) 0.25 % IJ SOLN
INTRAMUSCULAR | Status: DC | PRN
  Administered 2015-06-15: 53 mL

## 2015-06-15 MED ORDER — FENTANYL CITRATE (PF) 100 MCG/2ML IJ SOLN
INTRAMUSCULAR | Status: DC | PRN
  Administered 2015-06-15: 12.5 ug via INTRAVENOUS
  Administered 2015-06-15: 25 ug via INTRAVENOUS
  Administered 2015-06-15 (×2): 12.5 ug via INTRAVENOUS
  Administered 2015-06-15: 25 ug via INTRAVENOUS
  Administered 2015-06-15: 12.5 ug via INTRAVENOUS

## 2015-06-15 MED ORDER — SODIUM CHLORIDE 0.9% FLUSH: 3.0000 mL | INTRAVENOUS | Status: DC | PRN

## 2015-06-15 MED ORDER — HYDRALAZINE HCL 20 MG/ML IJ SOLN
INTRAMUSCULAR | Status: DC | PRN
  Administered 2015-06-15: 10 mg via INTRAVENOUS

## 2015-06-15 MED ORDER — METHOTREXATE 2.5 MG PO TABS: 22.5000 mg | ORAL_TABLET | ORAL | Status: DC

## 2015-06-15 MED ORDER — BUPIVACAINE HCL (PF) 0.25 % IJ SOLN
INTRAMUSCULAR | Status: AC
  Filled 2015-06-15: qty 30

## 2015-06-15 MED ORDER — METHOTREXATE (ANTI-RHEUMATIC) 2.5 MG PO TABS
22.5000 mg | ORAL_TABLET | ORAL | Status: DC
Start: 2015-06-18 — End: 2015-06-15

## 2015-06-15 MED ORDER — SODIUM CHLORIDE 0.9 % IV SOLN
INTRAVENOUS | Status: DC | PRN
  Administered 2015-06-15: 50 mL/h via INTRAVENOUS

## 2015-06-15 MED ORDER — HEPARIN (PORCINE) IN NACL 2-0.9 UNIT/ML-% IJ SOLN
INTRAMUSCULAR | Status: AC
Start: 2015-06-15 — End: 2015-06-15
  Filled 2015-06-15: qty 500

## 2015-06-15 MED ORDER — MIDAZOLAM HCL 5 MG/5ML IJ SOLN
INTRAMUSCULAR | Status: DC | PRN
  Administered 2015-06-15: 2 mg via INTRAVENOUS
  Administered 2015-06-15 (×9): 1 mg via INTRAVENOUS

## 2015-06-15 MED ORDER — RIVAROXABAN 20 MG PO TABS: 20.0000 mg | ORAL_TABLET | Freq: Every day | ORAL | Status: DC

## 2015-06-15 MED ORDER — BUPIVACAINE HCL (PF) 0.25 % IJ SOLN
INTRAMUSCULAR | Status: AC
Start: 2015-06-15 — End: 2015-06-15
  Filled 2015-06-15: qty 30

## 2015-06-15 MED ORDER — ONDANSETRON HCL 4 MG/2ML IJ SOLN: 4.0000 mg | Freq: Four times a day (QID) | INTRAMUSCULAR | Status: DC | PRN

## 2015-06-15 MED ORDER — NITROGLYCERIN 0.4 MG SL SUBL: 0.4000 mg | SUBLINGUAL_TABLET | SUBLINGUAL | Status: DC | PRN

## 2015-06-15 MED ORDER — ACETAMINOPHEN 325 MG PO TABS
650.0000 mg | ORAL_TABLET | ORAL | Status: DC | PRN
  Administered 2015-06-15 – 2015-06-16 (×2): 650 mg via ORAL
  Filled 2015-06-15 (×2): qty 2

## 2015-06-15 MED ORDER — PREDNISONE 1 MG PO TABS
2.0000 mg | ORAL_TABLET | Freq: Every day | ORAL | Status: DC
  Filled 2015-06-15: qty 2

## 2015-06-15 SURGICAL SUPPLY — 13 items
BAG SNAP BAND KOVER 36X36 (MISCELLANEOUS) ×2 IMPLANT
CATH DUODECA/ISMUS 7FR REPROC (CATHETERS) ×2 IMPLANT
CATH EZ STEER NAV 8MM D-F CUR (ABLATOR) ×2 IMPLANT
CATH JOSEPHSON QUAD-ALLRED 6FR (CATHETERS) ×2 IMPLANT
CATH WEBSTER BI DIR CS D-F CRV (CATHETERS) ×2 IMPLANT
PACK EP LATEX FREE (CUSTOM PROCEDURE TRAY) ×1
PACK EP LF (CUSTOM PROCEDURE TRAY) ×1 IMPLANT
PAD DEFIB LIFELINK (PAD) ×2 IMPLANT
PATCH CARTO3 (PAD) ×2 IMPLANT
SHEATH PINNACLE 6F 10CM (SHEATH) ×2 IMPLANT
SHEATH PINNACLE 7F 10CM (SHEATH) ×4 IMPLANT
SHEATH PINNACLE 8F 10CM (SHEATH) ×2 IMPLANT
SHIELD RADPAD SCOOP 12X17 (MISCELLANEOUS) ×2 IMPLANT

## 2015-06-15 NOTE — H&P (Addendum)
William [name] presents to the hospital for atrial flutter.  He is planned for a flutter ablation today.  He has been compliant with his anticoagulation for > 3 weeks.  I explained risks and benefits.  Risks include bleeding, tamponade, heart block, stroke, among others.  Patient understands these risks and has agreed to the procedure.    [name], MD 06/15/2015 11:50 AM

## 2015-06-15 NOTE — Progress Notes (Signed)
Site area: RFV x 2 / LFV x 2 Site Prior to Removal:  Level 0/0 Pressure Applied For:38min-15min Manual:   yes Patient Status During Pull:  stable Post Pull Site:  Level 0/0 Post Pull Instructions Given: yes  Post Pull Pulses Present: N/A Dressing Applied: tegaderm  Bedrest begins @ [id number] till 2140 Comments:

## 2015-06-15 NOTE — Discharge Summary (Signed)
ELECTROPHYSIOLOGY PROCEDURE DISCHARGE SUMMARY    Patient ID: William [name],  MRN: [id number], DOB/AGE: [birth year] 65 y.o.  Admit date: 06/15/2015 Discharge date: 06/16/15  Primary Care Physician: [name], MD Primary Cardiologist: Dr. Martin[place] Electrophysiologist: Dr. Caryl Comes  Primary Discharge Diagnosis:  1. Typical Atrial flutter status post ablation this admission     CHA2DS2Vasc is at least 3 on Xarelto  Secondary Discharge Diagnosis:  1. CAD 2. HTN 3. HLD 4. PMR 5. RA  Allergies  Allergen Reactions  . Oxycodone Itching  . Iodine Itching  . Zocor [Simvastatin] Other (See Comments)    Pain     Procedures This Admission: 1.  Electrophysiology study and radiofrequency catheter ablation on 06/15/15 by Dr Curt Bears.  This study demonstrated  CONCLUSIONS:  1. Isthmus-dependent right atrial flutter upon presentation.  2. Successful radiofrequency ablation of atrial flutter along the cavotricuspid isthmus with complete bidirectional isthmus block achieved.  3. No inducible arrhythmias following ablation.  4. No early apparent complications.   Brief HPI: William [name] is a 65 y.o. male with a past medical history as outlined above. He has documented atrial flutter. He has been appropriately anticoagulated for >4 weeks.  Risks, benefits, and alternatives to ablation were reviewed with the patient who wished to proceed.   Hospital Course:  The patient was admitted and underwent EPS/RFCA with details as outlined above.He was monitored on telemetry overnight which demonstrated SB/SR 50's-60's.  Groin/procedure sites were without complication.  They were examined by Dr Curt Bears who considered them stable for discharge to home.  Follow up [name] be arranged in 4 weeks.  Wound care and restrictions were reviewed with the patient prior to discharge.   Physical Exam: Filed Vitals:   06/15/15 2011 06/15/15 2157 06/15/15 2159 06/16/15 0500  BP: 148/81 134/84 134/84  131/75  Pulse: 70 73 73 56  Temp: 98.5 F (36.9 C)   98.5 F (36.9 C)  TempSrc: Oral   Oral  Resp: 16   16  Height:      Weight:    227 lb 14.4 oz (103.375 kg)  SpO2: 98%   96%     GEN- The patient is well appearing, alert and oriented x 3 today.   HEENT: normocephalic, atraumatic; sclera clear, conjunctiva pink; hearing intact; oropharynx clear; neck supple, no JVP Lymph- no cervical lymphadenopathy Lungs- Clear to ausculation bilaterally, normal work of breathing.  No wheezes, rales, rhonchi Heart- Regular rate and rhythm, no murmurs, rubs or gallops, PMI not laterally displaced GI- soft, non-tender, non-distended Extremities- no clubbing, cyanosis, or edema; DP/PT/radial pulses 2+ bilaterally, b/l groin without hematoma/bruit MS- no significant deformity or atrophy Skin- warm and dry, no rash or lesion Psych- euthymic mood, full affect Neuro- strength and sensation are intact   Labs:   Lab Results  Component Value Date   WBC 9.4 06/08/2015   HGB 17.2* 06/08/2015   HCT 50.4* 06/08/2015   MCV 96.7 06/08/2015   PLT 202 06/08/2015   Discharge Medications:    Medication List    TAKE these medications        acetaminophen 500 MG tablet  Commonly known as:  TYLENOL  Take 500 mg by mouth every 6 (six) hours as needed for mild pain.     albuterol 108 (90 Base) MCG/ACT inhaler  Commonly known as:  PROVENTIL HFA;VENTOLIN HFA  Inhale 1-2 puffs into the lungs every 6 (six) hours as needed for wheezing or shortness of breath.     atorvastatin 20  MG tablet  Commonly known as:  LIPITOR  Take 20 mg by mouth daily.     Fish Oil 500 MG Caps  Take 2 capsules by mouth 2 (two) times daily.     folic acid 1 MG tablet  Commonly known as:  FOLVITE  Take 1 mg by mouth daily.     lisinopril 40 MG tablet  Commonly known as:  PRINIVIL,ZESTRIL  Take 40 mg by mouth daily.     magnesium gluconate 500 MG tablet  Commonly known as:  MAGONATE  Take 500 mg by mouth every evening.      methotrexate 2.5 MG tablet  Commonly known as:  RHEUMATREX  Take 22.5 mg by mouth once a week. Sundays     metoprolol 100 MG tablet  Commonly known as:  LOPRESSOR  Take 0.5 tablets (50 mg total) by mouth 2 (two) times daily.     MULTI-VITAMIN PO  Take 1 tablet by mouth daily.     multivitamin with minerals Tabs tablet  Take 1 tablet by mouth daily.     nitroGLYCERIN 0.4 MG SL tablet  Commonly known as:  NITROSTAT  Place 1 tablet (0.4 mg total) under the tongue every 5 (five) minutes as needed for chest pain.     predniSONE 1 MG tablet  Commonly known as:  DELTASONE  Take 2 mg by mouth daily with breakfast.     XARELTO 20 MG Tabs tablet  Generic drug:  rivaroxaban  Take 1 tablet by mouth daily. Take 1 tab daily        Disposition:  Home Discharge Instructions    Diet - low sodium heart healthy    Complete by:  As directed      Increase activity slowly    Complete by:  As directed           Follow-up Information    Follow up with [name], MD On 07/10/2015.   Specialty:  Cardiology   Why:  2:45PM   Contact information:   [address] [place] [address] [phone]       Duration of Discharge Encounter: Greater than 30 minutes including physician time.  Signed, Tommye Standard, PA-C  06/16/2015 8:20 AM      I have seen and examined this patient with Tommye Standard.  Agree with above, note added to reflect my findings.  On exam, regular rhythm, no murmurs, lungs clear.  Had atrial flutter ablation without issues.  In sinus rhythm this AM feeling well.  Plan for discharge today with follow up in clinic to discuss further anticoagulation.    [name] MD 06/16/2015 1:00 PM

## 2015-06-16 ENCOUNTER — Encounter (HOSPITAL_COMMUNITY): Payer: Self-pay | Admitting: Cardiology

## 2015-06-16 DIAGNOSIS — I483 Typical atrial flutter: Secondary | ICD-10-CM | POA: Diagnosis not present

## 2015-06-16 NOTE — Progress Notes (Signed)
Patient resting at this time and denies pain or discomfort, bilateral groin sites remain C,D,I with no s/s of complications, VSS, will give updated report to [place] RN and transfer care back to her.

## 2015-06-16 NOTE — Progress Notes (Signed)
No morning medications given this morning. Pt would like to take all of his medications when he arrives home

## 2015-06-16 NOTE — Progress Notes (Signed)
Report received via Victoria RN in patient's room using SBAR format, reviewed orders, labs, VS, meds and patient's general condition, assumed care of patient. 

## 2015-06-16 NOTE — Discharge Instructions (Signed)
No driving for 4 days. No lifting over 5 lbs for 1 week. No vigorous or sexual activity for 1 week. You may return to work on 06/22/15. Keep procedure site clean & dry. If you notice increased pain, swelling, bleeding or pus, call/return!  You may shower, but no soaking baths/hot tubs/pools for 1 week.

## 2015-06-16 NOTE — Progress Notes (Signed)
Pt given all discharge paperwork. IV removed. Pt and wife [name] understanding

## 2015-07-03 NOTE — Progress Notes (Signed)
Electrophysiology Office Note   Date:  07/04/2015   ID:  William [name], DOB 03-30-1950, MRN [id number]  PCP:  [name], MD  Cardiologist:  Klein, Martinique Primary Electrophysiologist:  Constance Haw, MD    Chief Complaint  Patient presents with  . Follow-up     History of Present Illness: William [name] is a 65 y.o. male who presents today for electrophysiology evaluation.   He has a of PAFlutter, CAD (CABG2001), HTN, polymyalgia rheumatica, and RA.  He had an atrial flutter ablation 06/15/15.  He currently feels well without any complaints. He has not noted any palpitations, and has felt about the same since his ablation. He is able to do all his daily activities, and exerts himself without issues.   Today, he denies symptoms of palpitations, chest pain, shortness of breath, orthopnea, PND, lower extremity edema, claudication, dizziness, presyncope, syncope, bleeding, or neurologic sequela. The patient is tolerating medications without difficulties and is otherwise without complaint today.    Past Medical History  Diagnosis Date  . Coronary disease 2001    CABG  . Hypercholesterolemia   . Polymyalgia rheumatica ([place])   . Insomnia   . Tobacco abuse   . Hypertension   . Myocardial infarction ([place])   . Pneumonia   . Atrial flutter ([place]) 09/19/2012  . CMV (cytomegalovirus infection) ([place])    Past Surgical History  Procedure Laterality Date  . Cardiac catheterization  03/16/1999    ejection fraction 65%--After surgery pt. developed hives and itching  . Coronary artery bypass graft  03/19/1999    LIMA-LAD, RIMA-OM2, SVG-OM1/OM3, SVG-AM  . Trigger finger release    . Cardioversion N/A 03/28/2015    Procedure: CARDIOVERSION;  Surgeon: Skeet Latch, MD;  Location: [facility];  Service: Cardiovascular;  Laterality: N/A;  . Electrophysiologic study N/A 06/15/2015    Procedure: A-Flutter Ablation;  Surgeon: Will Meredith Leeds, MD;  Location: [facility];   Service: Cardiovascular;  Laterality: N/A;     Current Outpatient Prescriptions  Medication Sig Dispense Refill  . acetaminophen (TYLENOL) 500 MG tablet Take 500 mg by mouth every 6 (six) hours as needed for mild pain.     Marland Kitchen albuterol (PROVENTIL HFA;VENTOLIN HFA) 108 (90 BASE) MCG/ACT inhaler Inhale 1-2 puffs into the lungs every 6 (six) hours as needed for wheezing or shortness of breath. 1 Inhaler 0  . atorvastatin (LIPITOR) 20 MG tablet Take 20 mg by mouth daily.    . folic acid (FOLVITE) 1 MG tablet Take 1 mg by mouth daily.    Marland Kitchen lisinopril (PRINIVIL,ZESTRIL) 40 MG tablet Take 40 mg by mouth daily.    . magnesium gluconate (MAGONATE) 500 MG tablet Take 500 mg by mouth every evening.     . methotrexate (RHEUMATREX) 2.5 MG tablet Take 22.5 mg by mouth once a week. Sundays    . metoprolol (LOPRESSOR) 100 MG tablet Take 0.5 tablets (50 mg total) by mouth 2 (two) times daily. 60 tablet 0  . Multiple Vitamin (MULTIVITAMIN WITH MINERALS) TABS tablet Take 1 tablet by mouth daily.    . nitroGLYCERIN (NITROSTAT) 0.4 MG SL tablet Place 1 tablet (0.4 mg total) under the tongue every 5 (five) minutes as needed for chest pain. 25 tablet 11  . Omega-3 Fatty Acids (FISH OIL) 500 MG CAPS Take 2 capsules by mouth 2 (two) times daily.    . predniSONE (DELTASONE) 1 MG tablet Take 2 mg by mouth daily with breakfast.     . XARELTO 20  MG TABS tablet Take 1 tablet by mouth daily. Take 1 tab daily  1   No current facility-administered medications for this visit.    Allergies:   Oxycodone; Iodine; and Zocor   Social History:  The patient  reports that he has been smoking Cigars.  He has never used smokeless tobacco. He reports that he drinks about 0.6 oz of alcohol per week. He reports that he does not use illicit drugs.   Family History:  The patient's family history includes Bladder Cancer in his father; Colon cancer in his mother; Heart failure in his father; Pancreatic cancer in his mother.    ROS:   Please see the history of present illness.   Otherwise, review of systems is positive for .   All other systems are reviewed and negative.    PHYSICAL EXAM: VS:  Ht 5\' 11"  (1.803 m)  Wt 229 lb 12.8 oz (104.237 kg)  BMI 32.06 kg/m2 , BMI Body mass index is 32.06 kg/(m^2). GEN: Well nourished, well developed, in no acute distress HEENT: normal Neck: no JVD, carotid bruits, or masses Cardiac: RRR; no murmurs, rubs, or gallops,no edema  Respiratory:  clear to auscultation bilaterally, normal work of breathing GI: soft, nontender, nondistended, + BS MS: no deformity or atrophy Skin: warm and dry Neuro:  Strength and sensation are intact Psych: euthymic mood, full affect  EKG:  EKG is ordered today. The ekg ordered today shows sinus bradycardia rate 52  Recent Labs: 06/08/2015: BUN 21; Creat 0.87; Hemoglobin 17.2*; Platelets 202; Potassium 4.3; Sodium 138    Lipid Panel  No results found for: CHOL, TRIG, HDL, CHOLHDL, VLDL, LDLCALC, LDLDIRECT   Wt Readings from Last 3 Encounters:  07/04/15 229 lb 12.8 oz (104.237 kg)  06/16/15 227 lb 14.4 oz (103.375 kg)  06/08/15 229 lb (103.874 kg)      Other studies Reviewed: Additional studies/ records that were reviewed today include: 02/21/15: Echocardiogram Study Conclusions - Left ventricle: The cavity size was normal. Wall thickness was  increased in a pattern of mild LVH. Systolic function was normal.  The estimated ejection fraction was in the range of 55% to 60%.  Wall motion was normal; there were no regional wall motion  abnormalities. The study is not technically sufficient to allow  evaluation of LV diastolic function. - Aortic valve: Trileaflet. Sclerosis without stenosis. There was  no regurgitation. - Mitral valve: Structurally normal valve. There was trivial  regurgitation. - Left atrium: The atrium was normal in size. - Right atrium: The atrium was mildly dilated. - Tricuspid valve: There was trivial  regurgitation. - Pulmonary arteries: PA peak pressure: 20 mm Hg (S). - Inferior vena cava: The vessel was normal in size. The  respirophasic diameter changes were in the normal range (= 50%),  consistent with normal central venous pressure. Impressions: - Compared to the prior echo in 2014, there are no significant  changes.   ASSESSMENT AND PLAN:  1.  Typical atrial flutter: S/P ablation 06/15/15.  On Xarelto currently.He is feeling well currently without any major complaints. I have told him that one month after his ablation, he can stop his Xarelto. He will start taking an 81 mg of aspirin at that time. He is bradycardic in clinic today, but does not have symptoms of bradycardia. We'll continue to monitor.  Current medicines are reviewed at length with the patient today.   The patient does not have concerns regarding his medicines.  The following changes were made today:  none  Labs/ tests ordered today include:  No orders of the defined types were placed in this encounter.     Disposition:   FU with Will Camnitz as needed  Signed, Will Meredith Leeds, MD  07/04/2015 10:14 AM     [facility] [address] [place] [address] [phone] (office) [phone] (fax)

## 2015-07-04 ENCOUNTER — Encounter: Payer: Self-pay | Admitting: Cardiology

## 2015-07-04 ENCOUNTER — Ambulatory Visit (INDEPENDENT_AMBULATORY_CARE_PROVIDER_SITE_OTHER): Payer: Medicare Other | Admitting: Cardiology

## 2015-07-04 VITALS — BP 142/90 | HR 52 | Ht 71.0 in | Wt 229.8 lb

## 2015-07-04 DIAGNOSIS — I483 Typical atrial flutter: Secondary | ICD-10-CM | POA: Diagnosis not present

## 2015-07-04 DIAGNOSIS — I2510 Atherosclerotic heart disease of native coronary artery without angina pectoris: Secondary | ICD-10-CM

## 2015-07-04 NOTE — Patient Instructions (Signed)
Medication Instructions:  Your physician recommends that you continue on your current medications as directed. Please refer to the Current Medication list given to you today.  Labwork: None ordered  Testing/Procedures: None ordered  Follow-Up: No follow up is needed at this time with Dr. Camnitz.  He will see you on an as needed basis.  If you need a refill on your cardiac medications before your next appointment, please call your pharmacy.  Thank you for choosing [facility]!!   Achsah Mcquade, RN [phone]      

## 2015-07-07 ENCOUNTER — Ambulatory Visit (INDEPENDENT_AMBULATORY_CARE_PROVIDER_SITE_OTHER): Payer: Medicare Other | Admitting: Cardiology

## 2015-07-07 ENCOUNTER — Encounter: Payer: Self-pay | Admitting: Cardiology

## 2015-07-07 VITALS — BP 162/101 | HR 56 | Ht 71.0 in | Wt 228.0 lb

## 2015-07-07 DIAGNOSIS — I483 Typical atrial flutter: Secondary | ICD-10-CM

## 2015-07-07 DIAGNOSIS — E785 Hyperlipidemia, unspecified: Secondary | ICD-10-CM

## 2015-07-07 DIAGNOSIS — I2510 Atherosclerotic heart disease of native coronary artery without angina pectoris: Secondary | ICD-10-CM | POA: Diagnosis not present

## 2015-07-07 DIAGNOSIS — I10 Essential (primary) hypertension: Secondary | ICD-10-CM | POA: Diagnosis not present

## 2015-07-07 NOTE — Patient Instructions (Addendum)
Check your blood pressure at home. If it is staying over 140/90 let me know and we will add amlodipine.  We will schedule you for a nuclear stress test  I will see you in 6 months.

## 2015-07-07 NOTE — Progress Notes (Signed)
Cardiology Office Note    Date:  07/07/2015   ID:  William [name], DOB 08-19-1950, MRN [id number]  PCP:  [name], MD  Cardiologist:  Dr. Emunah Texidor [place]     History of Present Illness: William [name] is a 65 y.o. male with a history of CAD, s/p CABG in 03/1999 (LIMA-LAD, RIMA-OM2, SVG-OM1/OM3, SVG-AM of the RCA), HTN, HL, atrial flutter, PMR.   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  He converted to NSR on his own and Eliquis was eventually d/c'd.  He was seen at the [facility] clinic in November 2016 and was noted to have an irregular pulse. He was  in atrial flutter. He denied any symptoms. He was  started on Xarelto. Metoprolol dose was increased for rate control. Echo showed mild LVH with normal systolic function. EF 55-60%. Mild RAE.   He underwent DCCV in Jan 2017. On follow up in Feb. He was back in atrial flutter. He subsequently underwent ablation of atrial flutter by Dr. Curt Bears on 06/15/15. No complications.   On follow up today he reports he is doing well. No chest pain or SOB. He is limited by hip pain. Denies palpitations or dizziness. He is dealing with some recent neck pain related to doing yard work.    Recent Labs: 06/08/2015: Creat 0.87; Hemoglobin 17.2*; Potassium 4.3  Wt Readings from Last 3 Encounters:  07/04/15 104.237 kg (229 lb 12.8 oz)  06/16/15 103.375 kg (227 lb 14.4 oz)  06/08/15 103.874 kg (229 lb)     Past Medical History  Diagnosis Date  . Coronary disease 2001    CABG  . Hypercholesterolemia   . Polymyalgia rheumatica ([place])   . Insomnia   . Tobacco abuse   . Hypertension   . Myocardial infarction ([place])   . Pneumonia   . Atrial flutter ([place]) 09/19/2012  . 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 acetaminophen (TYLENOL) 500 MG tablet Take 500 mg by mouth every 6 (six) hours as needed for mild pain.     Marland Kitchen albuterol (PROVENTIL  HFA;VENTOLIN HFA) 108 (90 BASE) MCG/ACT inhaler Inhale 1-2 puffs into the lungs every 6 (six) hours as needed for wheezing or shortness of breath. 1 Inhaler 0  . atorvastatin (LIPITOR) 20 MG tablet Take 20 mg by mouth daily.    . folic acid (FOLVITE) 1 MG tablet Take 1 mg by mouth daily.    Marland Kitchen lisinopril (PRINIVIL,ZESTRIL) 40 MG tablet Take 40 mg by mouth daily.    . magnesium gluconate (MAGONATE) 500 MG tablet Take 500 mg by mouth every evening.     . methotrexate (RHEUMATREX) 2.5 MG tablet Take 22.5 mg by mouth once a week. Sundays    . metoprolol (LOPRESSOR) 100 MG tablet Take 0.5 tablets (50 mg total) by mouth 2 (two) times daily. 60 tablet 0  . Multiple Vitamin (MULTIVITAMIN WITH MINERALS) TABS tablet Take 1 tablet by mouth daily.    . nitroGLYCERIN (NITROSTAT) 0.4 MG SL tablet Place 1 tablet (0.4 mg total) under the tongue every 5 (five) minutes as needed for chest pain. 25 tablet 11  . Omega-3 Fatty Acids (FISH OIL) 500 MG CAPS Take 2 capsules by mouth 2 (two) times daily.    . predniSONE (DELTASONE) 1 MG tablet Take 2 mg by mouth daily with breakfast.     . XARELTO 20 MG TABS tablet Take 1 tablet by  mouth daily. Take 1 tab daily  1   No current facility-administered medications for this visit.    Allergies:   Oxycodone; Iodine; and Zocor   Social History:  The patient  reports that he has been smoking Cigars.  He has never used smokeless tobacco. He reports that he drinks about 0.6 oz of alcohol per week. He reports that he does not use illicit drugs.   Family History:  The patient's family history includes Bladder Cancer in his father; Colon cancer in his mother; Heart failure in his father; Pancreatic cancer in his mother.   ROS:  Please see the history of present illness.   All other systems reviewed and negative.   PHYSICAL EXAM: VS:  There were no vitals taken for this visit. Well nourished, well developed, in no acute distress HEENT: normal Neck: no JVD Vascular:  No carotid  bruits bilat Cardiac:  normal S1, S2; IRRR; no murmur Lungs:  clear to auscultation bilaterally, no wheezing, rhonchi or rales Abd: soft, nontender, no hepatomegaly Ext: no edema Skin: warm and dry Neuro:  CNs 2-12 intact, no focal abnormalities noted  EKG:  07/04/15 shows sinus brady with rate 52. Otherwise normal. I have personally reviewed and interpreted this study.       ASSESSMENT AND PLAN:  1. CAD:  S/p CABG in 2001. Last myoview in 2012.  Stable. No angina. I have recommended a follow up stress test. Unable to walk on treadmill so will schedule for a Lexiscan myoview study.  2. Atrial Flutter:  Recurrent. Now s/p atrial flutter ablation. Per EP will stop Xarelto one month post procedure and resume ASA 81 mg daily.  3. Hypertension:  Elevated today but other recent readings have been OK. Will check BP with home monitor. If readings over 140/90 will add amlodipine.  4. Hyperlipidemia:  Continue statin.  Labs from [place] in Dec. Showed cholesterol 167, trig 95, HDL 49, LDL 99 5. Disposition: otherwise will follow up in 6 months.   Signed, Neils Siracusa [place]ique MD, [facility]    07/07/2015 7:12 AM

## 2015-07-10 ENCOUNTER — Ambulatory Visit: Payer: Medicare Other | Admitting: Cardiology

## 2015-07-18 ENCOUNTER — Ambulatory Visit: Payer: Medicare Other | Admitting: Cardiology

## 2015-08-03 ENCOUNTER — Telehealth (HOSPITAL_COMMUNITY): Payer: Self-pay

## 2015-08-03 NOTE — Telephone Encounter (Signed)
Encounter complete. 

## 2015-08-04 ENCOUNTER — Telehealth (HOSPITAL_COMMUNITY): Payer: Self-pay

## 2015-08-04 NOTE — Telephone Encounter (Signed)
Encounter complete. 

## 2015-08-08 ENCOUNTER — Ambulatory Visit (HOSPITAL_COMMUNITY)
Admission: RE | Admit: 2015-08-08 | Discharge: 2015-08-08 | Disposition: A | Discharge status: Home / Self Care | Payer: Medicare Other | Source: Ambulatory Visit | Attending: Cardiology | Admitting: Cardiology

## 2015-08-08 DIAGNOSIS — I10 Essential (primary) hypertension: Secondary | ICD-10-CM | POA: Diagnosis not present

## 2015-08-08 DIAGNOSIS — I483 Typical atrial flutter: Secondary | ICD-10-CM | POA: Diagnosis not present

## 2015-08-08 DIAGNOSIS — I2510 Atherosclerotic heart disease of native coronary artery without angina pectoris: Secondary | ICD-10-CM | POA: Diagnosis not present

## 2015-08-08 DIAGNOSIS — E785 Hyperlipidemia, unspecified: Secondary | ICD-10-CM

## 2015-08-08 LAB — MYOCARDIAL PERFUSION IMAGING
CHL CUP NUCLEAR SSS: 1
CSEPPHR: 71 {beats}/min
LV dias vol: 140 mL (ref 62–150)
LVSYSVOL: 64 mL
NUC STRESS TID: 1.11
Rest HR: 60 {beats}/min
SDS: 1
SRS: 0

## 2015-08-08 MED ORDER — TECHNETIUM TC 99M TETROFOSMIN IV KIT
30.0000 | PACK | Freq: Once | INTRAVENOUS | Status: AC | PRN
  Administered 2015-08-08: 30 via INTRAVENOUS
  Filled 2015-08-08: qty 30

## 2015-08-08 MED ORDER — TECHNETIUM TC 99M TETROFOSMIN IV KIT
11.0000 | PACK | Freq: Once | INTRAVENOUS | Status: AC | PRN
  Administered 2015-08-08: 11 via INTRAVENOUS
  Filled 2015-08-08: qty 11

## 2015-08-08 MED ORDER — REGADENOSON 0.4 MG/5ML IV SOLN
0.4000 mg | Freq: Once | INTRAVENOUS | Status: AC
  Administered 2015-08-08: 0.4 mg via INTRAVENOUS

## 2015-08-20 ENCOUNTER — Emergency Department (HOSPITAL_COMMUNITY): Payer: Medicare Other

## 2015-08-20 ENCOUNTER — Encounter (HOSPITAL_COMMUNITY): Payer: Self-pay | Admitting: Emergency Medicine

## 2015-08-20 ENCOUNTER — Observation Stay (HOSPITAL_COMMUNITY)
Admission: EM | Admit: 2015-08-20 | Discharge: 2015-08-22 | Disposition: A | Discharge status: Home / Self Care | Payer: Medicare Other | Attending: Internal Medicine | Admitting: Internal Medicine

## 2015-08-20 ENCOUNTER — Other Ambulatory Visit: Payer: Self-pay

## 2015-08-20 DIAGNOSIS — Z7952 Long term (current) use of systemic steroids: Secondary | ICD-10-CM | POA: Insufficient documentation

## 2015-08-20 DIAGNOSIS — D696 Thrombocytopenia, unspecified: Secondary | ICD-10-CM | POA: Diagnosis not present

## 2015-08-20 DIAGNOSIS — I10 Essential (primary) hypertension: Secondary | ICD-10-CM | POA: Diagnosis present

## 2015-08-20 DIAGNOSIS — F17290 Nicotine dependence, other tobacco product, uncomplicated: Secondary | ICD-10-CM | POA: Diagnosis not present

## 2015-08-20 DIAGNOSIS — I714 Abdominal aortic aneurysm, without rupture, unspecified: Secondary | ICD-10-CM | POA: Diagnosis present

## 2015-08-20 DIAGNOSIS — Z951 Presence of aortocoronary bypass graft: Secondary | ICD-10-CM | POA: Diagnosis not present

## 2015-08-20 DIAGNOSIS — E7800 Pure hypercholesterolemia, unspecified: Secondary | ICD-10-CM | POA: Insufficient documentation

## 2015-08-20 DIAGNOSIS — I252 Old myocardial infarction: Secondary | ICD-10-CM | POA: Diagnosis not present

## 2015-08-20 DIAGNOSIS — I4892 Unspecified atrial flutter: Secondary | ICD-10-CM | POA: Diagnosis not present

## 2015-08-20 DIAGNOSIS — M545 Low back pain: Secondary | ICD-10-CM | POA: Insufficient documentation

## 2015-08-20 DIAGNOSIS — M353 Polymyalgia rheumatica: Secondary | ICD-10-CM | POA: Diagnosis not present

## 2015-08-20 DIAGNOSIS — R1903 Right lower quadrant abdominal swelling, mass and lump: Secondary | ICD-10-CM | POA: Diagnosis present

## 2015-08-20 DIAGNOSIS — I483 Typical atrial flutter: Secondary | ICD-10-CM | POA: Diagnosis not present

## 2015-08-20 DIAGNOSIS — Z7982 Long term (current) use of aspirin: Secondary | ICD-10-CM | POA: Diagnosis not present

## 2015-08-20 DIAGNOSIS — Z79899 Other long term (current) drug therapy: Secondary | ICD-10-CM | POA: Diagnosis not present

## 2015-08-20 DIAGNOSIS — E785 Hyperlipidemia, unspecified: Secondary | ICD-10-CM | POA: Diagnosis present

## 2015-08-20 DIAGNOSIS — D849 Immunodeficiency, unspecified: Secondary | ICD-10-CM

## 2015-08-20 DIAGNOSIS — I2510 Atherosclerotic heart disease of native coronary artery without angina pectoris: Secondary | ICD-10-CM | POA: Insufficient documentation

## 2015-08-20 DIAGNOSIS — R748 Abnormal levels of other serum enzymes: Secondary | ICD-10-CM | POA: Diagnosis not present

## 2015-08-20 DIAGNOSIS — R509 Fever, unspecified: Principal | ICD-10-CM | POA: Diagnosis present

## 2015-08-20 DIAGNOSIS — D899 Disorder involving the immune mechanism, unspecified: Secondary | ICD-10-CM

## 2015-08-20 DIAGNOSIS — R945 Abnormal results of liver function studies: Secondary | ICD-10-CM

## 2015-08-20 DIAGNOSIS — E871 Hypo-osmolality and hyponatremia: Secondary | ICD-10-CM

## 2015-08-20 DIAGNOSIS — R7989 Other specified abnormal findings of blood chemistry: Secondary | ICD-10-CM

## 2015-08-20 HISTORY — DX: Right lower quadrant abdominal swelling, mass and lump: R19.03

## 2015-08-20 HISTORY — DX: Cytomegaloviral mononucleosis without complications: B27.10

## 2015-08-20 HISTORY — DX: Abdominal aortic aneurysm, without rupture: I71.4

## 2015-08-20 LAB — CBC WITH DIFFERENTIAL/PLATELET
BASOS PCT: 1 %
Basophils Absolute: 0 10*3/uL (ref 0.0–0.1)
Eosinophils Absolute: 0 10*3/uL (ref 0.0–0.7)
Eosinophils Relative: 0 %
HEMATOCRIT: 51.4 % (ref 39.0–52.0)
Hemoglobin: 17.6 g/dL — ABNORMAL HIGH (ref 13.0–17.0)
LYMPHS PCT: 12 %
Lymphs Abs: 0.6 10*3/uL — ABNORMAL LOW (ref 0.7–4.0)
MCH: 32.8 pg (ref 26.0–34.0)
MCHC: 34.2 g/dL (ref 30.0–36.0)
MCV: 95.9 fL (ref 78.0–100.0)
MONO ABS: 0.8 10*3/uL (ref 0.1–1.0)
MONOS PCT: 15 %
NEUTROS ABS: 3.6 10*3/uL (ref 1.7–7.7)
Neutrophils Relative %: 72 %
Platelets: 75 10*3/uL — ABNORMAL LOW (ref 150–400)
RBC: 5.36 MIL/uL (ref 4.22–5.81)
RDW: 14 % (ref 11.5–15.5)
WBC: 5 10*3/uL (ref 4.0–10.5)

## 2015-08-20 LAB — COMPREHENSIVE METABOLIC PANEL
ALBUMIN: 3.6 g/dL (ref 3.5–5.0)
ALT: 68 U/L — AB (ref 17–63)
AST: 71 U/L — AB (ref 15–41)
Alkaline Phosphatase: 95 U/L (ref 38–126)
Anion gap: 9 (ref 5–15)
BUN: 14 mg/dL (ref 6–20)
CHLORIDE: 104 mmol/L (ref 101–111)
CO2: 18 mmol/L — AB (ref 22–32)
CREATININE: 0.9 mg/dL (ref 0.61–1.24)
Calcium: 9 mg/dL (ref 8.9–10.3)
GFR calc Af Amer: >60 mL/min (ref >60)
GFR calc non Af Amer: >60 mL/min (ref >60)
GLUCOSE: 104 mg/dL — AB (ref 65–99)
POTASSIUM: 4.4 mmol/L (ref 3.5–5.1)
SODIUM: 131 mmol/L — AB (ref 135–145)
Total Bilirubin: 2 mg/dL — ABNORMAL HIGH (ref 0.3–1.2)
Total Protein: 5.9 g/dL — ABNORMAL LOW (ref 6.5–8.1)

## 2015-08-20 LAB — CBC
HEMATOCRIT: 47.5 % (ref 39.0–52.0)
Hemoglobin: 15.8 g/dL (ref 13.0–17.0)
MCH: 32.1 pg (ref 26.0–34.0)
MCHC: 33.3 g/dL (ref 30.0–36.0)
MCV: 96.5 fL (ref 78.0–100.0)
PLATELETS: 76 10*3/uL — AB (ref 150–400)
RBC: 4.92 MIL/uL (ref 4.22–5.81)
RDW: 14 % (ref 11.5–15.5)
WBC: 3.3 10*3/uL — AB (ref 4.0–10.5)

## 2015-08-20 LAB — URINALYSIS, ROUTINE W REFLEX MICROSCOPIC
Bilirubin Urine: NEGATIVE
Glucose, UA: NEGATIVE mg/dL
Ketones, ur: 15 mg/dL — AB
LEUKOCYTES UA: NEGATIVE
Nitrite: NEGATIVE
PROTEIN: NEGATIVE mg/dL
Specific Gravity, Urine: 1.027 (ref 1.005–1.030)
pH: 5.5 (ref 5.0–8.0)

## 2015-08-20 LAB — I-STAT CG4 LACTIC ACID, ED
LACTIC ACID, VENOUS: 1.29 mmol/L (ref 0.5–2.0)
Lactic Acid, Venous: 0.98 mmol/L (ref 0.5–2.0)

## 2015-08-20 LAB — URINE MICROSCOPIC-ADD ON

## 2015-08-20 LAB — CREATININE, SERUM: Creatinine, Ser: 1.06 mg/dL (ref 0.61–1.24)

## 2015-08-20 LAB — C-REACTIVE PROTEIN: CRP: 4.3 mg/dL — ABNORMAL HIGH (ref <1.0)

## 2015-08-20 LAB — SEDIMENTATION RATE: Sed Rate: 1 mm/hr (ref 0–16)

## 2015-08-20 MED ORDER — SODIUM CHLORIDE 0.9 % IV BOLUS (SEPSIS)
1000.0000 mL | Freq: Once | INTRAVENOUS | Status: AC
  Administered 2015-08-20: 1000 mL via INTRAVENOUS

## 2015-08-20 MED ORDER — ADULT MULTIVITAMIN W/MINERALS CH
1.0000 | ORAL_TABLET | Freq: Every day | ORAL | Status: DC
  Administered 2015-08-21 – 2015-08-22 (×2): 1 via ORAL
  Filled 2015-08-20 (×2): qty 1

## 2015-08-20 MED ORDER — ACETAMINOPHEN 325 MG PO TABS
650.0000 mg | ORAL_TABLET | Freq: Four times a day (QID) | ORAL | Status: DC | PRN
Start: 2015-08-20 — End: 2015-08-22
  Administered 2015-08-20 – 2015-08-22 (×4): 650 mg via ORAL
  Filled 2015-08-20 (×4): qty 2

## 2015-08-20 MED ORDER — ZOLPIDEM TARTRATE 5 MG PO TABS
5.0000 mg | ORAL_TABLET | Freq: Once | ORAL | Status: AC
  Administered 2015-08-20: 5 mg via ORAL
  Filled 2015-08-20: qty 1

## 2015-08-20 MED ORDER — ATORVASTATIN CALCIUM 20 MG PO TABS
20.0000 mg | ORAL_TABLET | Freq: Every day | ORAL | Status: DC
  Administered 2015-08-20 – 2015-08-21 (×2): 20 mg via ORAL
  Filled 2015-08-20 (×2): qty 1

## 2015-08-20 MED ORDER — METOPROLOL TARTRATE 50 MG PO TABS
50.0000 mg | ORAL_TABLET | Freq: Two times a day (BID) | ORAL | Status: DC
  Administered 2015-08-20 – 2015-08-22 (×4): 50 mg via ORAL
  Filled 2015-08-20 (×4): qty 1

## 2015-08-20 MED ORDER — OMEGA-3-ACID ETHYL ESTERS 1 G PO CAPS
1.0000 g | ORAL_CAPSULE | Freq: Two times a day (BID) | ORAL | Status: DC
  Administered 2015-08-20 – 2015-08-22 (×4): 1 g via ORAL
  Filled 2015-08-20 (×4): qty 1

## 2015-08-20 MED ORDER — NITROGLYCERIN 0.4 MG SL SUBL: 0.4000 mg | SUBLINGUAL_TABLET | SUBLINGUAL | Status: DC | PRN

## 2015-08-20 MED ORDER — ALBUTEROL SULFATE (2.5 MG/3ML) 0.083% IN NEBU: 2.5000 mg | INHALATION_SOLUTION | RESPIRATORY_TRACT | Status: DC | PRN

## 2015-08-20 MED ORDER — SODIUM CHLORIDE 0.9 % IV SOLN
INTRAVENOUS | Status: DC
  Administered 2015-08-20 – 2015-08-22 (×2): via INTRAVENOUS

## 2015-08-20 MED ORDER — FOLIC ACID 1 MG PO TABS
1.0000 mg | ORAL_TABLET | Freq: Every day | ORAL | Status: DC
  Administered 2015-08-21 – 2015-08-22 (×2): 1 mg via ORAL
  Filled 2015-08-20 (×2): qty 1

## 2015-08-20 MED ORDER — ASPIRIN EC 81 MG PO TBEC
81.0000 mg | DELAYED_RELEASE_TABLET | Freq: Every day | ORAL | Status: DC
  Administered 2015-08-20 – 2015-08-22 (×3): 81 mg via ORAL
  Filled 2015-08-20 (×3): qty 1

## 2015-08-20 MED ORDER — ATORVASTATIN CALCIUM 20 MG PO TABS: 20.0000 mg | ORAL_TABLET | Freq: Every day | ORAL | Status: DC

## 2015-08-20 MED ORDER — MAGNESIUM GLUCONATE 500 MG PO TABS
500.0000 mg | ORAL_TABLET | Freq: Every evening | ORAL | Status: DC
  Administered 2015-08-21: 500 mg via ORAL
  Filled 2015-08-20 (×2): qty 1

## 2015-08-20 MED ORDER — LISINOPRIL 40 MG PO TABS
40.0000 mg | ORAL_TABLET | Freq: Every day | ORAL | Status: DC
  Administered 2015-08-21 – 2015-08-22 (×2): 40 mg via ORAL
  Filled 2015-08-20 (×2): qty 1

## 2015-08-20 MED ORDER — ACETAMINOPHEN 650 MG RE SUPP: 650.0000 mg | Freq: Four times a day (QID) | RECTAL | Status: DC | PRN

## 2015-08-20 MED ORDER — PREDNISONE 1 MG PO TABS
3.0000 mg | ORAL_TABLET | Freq: Every day | ORAL | Status: DC
  Administered 2015-08-21 – 2015-08-22 (×2): 3 mg via ORAL
  Filled 2015-08-20 (×2): qty 3

## 2015-08-20 MED ORDER — SODIUM CHLORIDE 0.9% FLUSH
3.0000 mL | Freq: Two times a day (BID) | INTRAVENOUS | Status: DC
  Administered 2015-08-20: 3 mL via INTRAVENOUS

## 2015-08-20 NOTE — ED Notes (Signed)
Pt sts lower back pain and fever with chills x 3 days

## 2015-08-20 NOTE — ED Provider Notes (Signed)
Patient signed out to me at shift change, patient presents to emergency department with fever up to 102 at home and Reiter's. Also complaining of lower back pain. Patient with history of polymyalgia rheumatica, on daily prednisone and methotrexate. Initial workup showed elevated bili of 2, slightly elevated LFTs, platelets of 75. Urinalysis is a large hemoglobin, otherwise no signs of infection. Renal stone study was negative. It did show a mild aneurysm dilation of the distal abdominal aorta with a maximum diameter of 3.2. Also showed a fatty mass in the right lower quadrant.  Patient's chart reviewed. Patient with similar presentation 2 years ago, diagnosed with CMV. He states his symptoms feel exactly the same. Sparse his back pain, I do not think patient has epidural abscess or discitis, his symptoms do not radiate. His pain is across lower back. MRI was considered, however will admit patient for further evaluation and possible MRI as wanted by accepting team.   Spoke with [facility] hospitalist  Jeannett Senior, PA-C 08/23/15 [id number]  Forde Dandy, MD 08/23/15 1933

## 2015-08-20 NOTE — H&P (Signed)
TRH H&P   Patient Demographics:    William [name], is a 65 y.o. male  MRN: [id number]   DOB - 23-Oct-1950  Admit Date - 08/20/2015  Outpatient Primary MD for the patient is [name], MD  Referring PA: Kirichenko  Patient coming from: Home  Chief Complaint  Patient presents with  . Back Pain  . Fever      HPI:    William [name]  is a 65 y.o. male, With history of CAD status post CABG, PMR on prednisone and methotrexate, hypertension, MI, atrial flutter status post ablation currently off anticoagulation, history of hospitalization in 2014 secondary to CMV, presents with complaints of fever, chills, over the last 24 hours, wife report fever 102.5 last night, again 102 this morning, patient reports fever, chills, loss of appetite, lower back pain, reports he denies headache, cough, productive sputum, dysuria, polyuria, any sick contacts, or abdominal pain. In ED workup significant for mildly elevated LFTs, and thrombocytopenia, CT renal with no evidence of hydronephrosis or renal stones, blood cultures were sent, negative urinalysis, hospitalist was called to admit.    Review of systems:    In addition to the HPI abov Complains of fever and chills No Headache, No changes with Vision or hearing, No problems swallowing food or Liquids, No Chest pain, Cough or Shortness of Breath, No Abdominal pain, No Nausea or Vommitting, Bowel movements are regular, No Blood in stool or Urine, No dysuria, No new skin rashes or bruises, No new joints pains-aches,  No new weakness, tingling, numbness in any extremity, and planes of lower back pain No recent weight gain or loss, No polyuria, polydypsia or polyphagia, No significant Mental Stressors.  A full 10 point Review of Systems was done, except as stated above, all other Review of Systems were negative.   With Past History of  the following :    Past Medical History  Diagnosis Date  . Coronary disease 2001    CABG  . Hypercholesterolemia   . Polymyalgia rheumatica ([place])   . Insomnia   . Tobacco abuse   . Hypertension   . Myocardial infarction ([place])   . Pneumonia   . Atrial flutter ([place]) 09/19/2012  . 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 Cardiac catheterization  03/16/1999    ejection fraction 65%--After surgery pt. developed hives and itching  . Coronary artery bypass graft  03/19/1999    LIMA-LAD, RIMA-OM2, SVG-OM1/OM3, SVG-AM  . Trigger finger release    . Cardioversion N/A 03/28/2015    Procedure: CARDIOVERSION;  Surgeon: Skeet Latch, MD;  Location: [facility];  Service: Cardiovascular;  Laterality: N/A;  . Electrophysiologic study N/A 06/15/2015    Procedure: A-Flutter Ablation;  Surgeon: Will Meredith Leeds, MD;  Location: [facility];  Service: Cardiovascular;  Laterality: N/A;      Social  History:     Social History  Substance Use Topics  . Smoking status: Current Some Day Smoker    Types: Cigars  . Smokeless tobacco: Never Used  . Alcohol Use: 0.6 oz/week    1 Cans of beer per week     Comment: Occasional beer     Lives - At home  Mobility - independent     Family History :     Family History  Problem Relation Age of Onset  . Pancreatic cancer Mother   . Colon cancer Mother   . Bladder Cancer Father   . Heart failure Father       Home Medications:   Prior to Admission medications   Medication Sig Start Date End Date Taking? Authorizing Provider  acetaminophen (TYLENOL) 500 MG tablet Take 500 mg by mouth every 6 (six) hours as needed for mild pain.    Yes Historical Provider, MD  acetaminophen (TYLENOL) 650 MG CR tablet Take 1,300 mg by mouth every 8 (eight) hours as needed for pain.   Yes Historical Provider, MD  albuterol (PROVENTIL HFA;VENTOLIN HFA) 108 (90 BASE) MCG/ACT inhaler Inhale 1-2  puffs into the lungs every 6 (six) hours as needed for wheezing or shortness of breath. 02/04/13  Yes Liliane Shi, PA-C  aspirin 81 MG tablet Take 81 mg by mouth daily.   Yes Historical Provider, MD  atorvastatin (LIPITOR) 20 MG tablet Take 20 mg by mouth daily.   Yes Historical Provider, MD  folic acid (FOLVITE) 1 MG tablet Take 1 mg by mouth daily.   Yes Historical Provider, MD  lisinopril (PRINIVIL,ZESTRIL) 40 MG tablet Take 40 mg by mouth daily.   Yes Historical Provider, MD  magnesium gluconate (MAGONATE) 500 MG tablet Take 500 mg by mouth every evening.    Yes Historical Provider, MD  methotrexate (RHEUMATREX) 2.5 MG tablet Take 22.5 mg by mouth once a week. Sundays   Yes Historical Provider, MD  metoprolol (LOPRESSOR) 100 MG tablet Take 0.5 tablets (50 mg total) by mouth 2 (two) times daily. 02/04/13  Yes Liliane Shi, PA-C  Multiple Vitamin (MULTIVITAMIN WITH MINERALS) TABS tablet Take 1 tablet by mouth daily.   Yes Historical Provider, MD  Omega-3 Fatty Acids (FISH OIL) 500 MG CAPS Take 2 capsules by mouth 2 (two) times daily.   Yes Historical Provider, MD  predniSONE (DELTASONE) 1 MG tablet Take 3 mg by mouth daily with breakfast.    Yes Historical Provider, MD  nitroGLYCERIN (NITROSTAT) 0.4 MG SL tablet Place 1 tablet (0.4 mg total) under the tongue every 5 (five) minutes as needed for chest pain. 02/04/13   Liliane Shi, PA-C     Allergies:     Allergies  Allergen Reactions  . Oxycodone Itching  . Iodine Itching  . Zocor [Simvastatin] Other (See Comments)    Pain     Physical Exam:   Vitals  Blood pressure 122/55, pulse 112, temperature 99.4 F (37.4 C), temperature source Rectal, resp. rate 18, SpO2 99 %.   1. General Well-developed male lying in bed in NAD,   2. Normal affect and insight, Not Suicidal or Homicidal, Awake Alert, Oriented X 3.  3. No F.N deficits, ALL C.Nerves Intact, Strength 5/5 all 4 extremities, Sensation intact all 4 extremities, Plantars  down going.  4. Ears and Eyes appear Normal, Conjunctivae clear, PERRLA. Moist Oral Mucosa.  5. Supple Neck, No JVD, No cervical lymphadenopathy appriciated, No Carotid Bruits.  6. Symmetrical Chest wall movement, Good air  movement bilaterally, CTAB.  7. RRR, No Gallops, Rubs or Murmurs, No Parasternal Heave.  8. Positive Bowel Sounds, Abdomen Soft, No tenderness, No organomegaly appriciated,No rebound -guarding or rigidity.  9.  No Cyanosis, Normal Skin Turgor, No Skin Rash or Bruise.  10. Good muscle tone,  joints appear normal , no effusions, Normal ROM. No tenderness to palpation in lumbar spine area, or paraspinal area .  11. No Palpable Lymph Nodes in Neck or Axillae     Data Review:    CBC  Recent Labs Lab 08/20/15 1154  WBC 5.0  HGB 17.6*  HCT 51.4  PLT 75*  MCV 95.9  MCH 32.8  MCHC 34.2  RDW 14.0  LYMPHSABS 0.6*  MONOABS 0.8  EOSABS 0.0  BASOSABS 0.0   ------------------------------------------------------------------------------------------------------------------  Chemistries   Recent Labs Lab 08/20/15 1154  NA 131*  K 4.4  CL 104  CO2 18*  GLUCOSE 104*  BUN 14  CREATININE 0.90  CALCIUM 9.0  AST 71*  ALT 68*  ALKPHOS 95  BILITOT 2.0*   ------------------------------------------------------------------------------------------------------------------ estimated creatinine clearance is 98.8 mL/min (by C-G formula based on Cr of 0.9). ------------------------------------------------------------------------------------------------------------------ No results for input(s): TSH, T4TOTAL, T3FREE, THYROIDAB in the last 72 hours.  Invalid input(s): FREET3  Coagulation profile No results for input(s): INR, PROTIME in the last 168 hours. ------------------------------------------------------------------------------------------------------------------- No results for input(s): DDIMER in the last 72  hours. -------------------------------------------------------------------------------------------------------------------  Cardiac Enzymes No results for input(s): CKMB, TROPONINI, MYOGLOBIN in the last 168 hours.  Invalid input(s): CK ------------------------------------------------------------------------------------------------------------------ No results found for: BNP   ---------------------------------------------------------------------------------------------------------------  Urinalysis    Component Value Date/Time   COLORURINE AMBER* 08/20/2015 1115   APPEARANCEUR CLEAR 08/20/2015 1115   LABSPEC 1.027 08/20/2015 1115   PHURINE 5.5 08/20/2015 1115   GLUCOSEU NEGATIVE 08/20/2015 1115   HGBUR LARGE* 08/20/2015 1115   BILIRUBINUR NEGATIVE 08/20/2015 1115   KETONESUR 15* 08/20/2015 1115   PROTEINUR NEGATIVE 08/20/2015 1115   UROBILINOGEN 1.0 09/17/2012 1950   NITRITE NEGATIVE 08/20/2015 1115   LEUKOCYTESUR NEGATIVE 08/20/2015 1115    ----------------------------------------------------------------------------------------------------------------   Imaging Results:    Dg Chest 2 View  08/20/2015  CLINICAL DATA:  Fever of 102, chills. EXAM: CHEST  2 VIEW COMPARISON:  03/23/2015 FINDINGS: Postsurgical changes from CABG, stable. Cardiomediastinal silhouette is normal. Mediastinal contours appear intact. There is no evidence of focal airspace consolidation, pleural effusion or pneumothorax. Osseous structures are without acute abnormality. Soft tissues are grossly normal. IMPRESSION: No active cardiopulmonary disease. Electronically Signed   By: Fidela Salisbury M.D.   On: 08/20/2015 11:41   US Abdomen Complete  08/20/2015  CLINICAL DATA:  Hepatic steatosis. Increased LFTs, low back pain, fever and chills. EXAM: ABDOMEN ULTRASOUND COMPLETE COMPARISON:  CT of the abdomen pelvis 08/20/2015 FINDINGS: Gallbladder: No gallstones or wall thickening visualized. No sonographic  Murphy sign noted by sonographer. Common bile duct: Diameter: 4.2 mm Liver: No focal lesion identified. Within normal limits in parenchymal echogenicity. IVC: No abnormality visualized. Pancreas: Visualized portion unremarkable. Spleen: Size and appearance within normal limits. Right Kidney: Length: 12.7 cm. Echogenicity within normal limits. No mass or hydronephrosis visualized. Left Kidney: Length: 12.6 cm. Echogenicity within normal limits. No mass or hydronephrosis visualized. Abdominal aorta: Mild fusiform aneurysmal dilation of the distal abdominal aorta is noted measuring 3.2 cm Other findings: None. IMPRESSION: No evidence of acute cholecystitis. Normal appearance of the solid abdominal organs sonographically, accounting for poor visualization of the pancreas, which was partially obscured by bowel gas. Mild fusiform aneurysmal dilation of the  distal abdominal aorta with maximum diameter of 3.2 cm. Electronically Signed   By: Fidela Salisbury M.D.   On: 08/20/2015 16:16   Ct Renal Stone Study  08/20/2015  CLINICAL DATA:  Patient with low back pain, fever and chills for 3 days. EXAM: CT ABDOMEN AND PELVIS WITHOUT CONTRAST TECHNIQUE: Multidetector CT imaging of the abdomen and pelvis was performed following the standard protocol without IV contrast. COMPARISON:  CT abdomen pelvis 09/18/2012. FINDINGS: Lower chest: Normal heart size. Dependent atelectasis within the bilateral lower lobes. No pleural effusion. Hepatobiliary: Liver is normal in size and contour. Liver is diffusely low in attenuation. Gallbladder is unremarkable. Pancreas: Mild fatty atrophy. Spleen: Unremarkable Adrenals/Urinary Tract: Grossly unchanged 1.1 cm right adrenal adenoma. Left adrenal gland is normal. Stable bilateral perinephric fat stranding. No nephroureterolithiasis. No hydronephrosis. Urinary bladder is unremarkable. Stomach/Bowel: No abnormal bowel wall thickening are evidence for bowel obstruction. No free fluid or free  intraperitoneal air. Small hiatal hernia. Normal morphology stomach. Vascular/Lymphatic: Aneurysmal dilatation of the abdominal aorta measuring 3.2 cm. Peripheral calcified noncalcified atherosclerotic plaque. No retroperitoneal lymphadenopathy. Other: Left-greater-than-right fat containing inguinal hernias. Within the right lower quadrant there is a new 1.6 cm fatty appearing mass with peripheral high attenuation (image 67; series 2), new from prior. Musculoskeletal: Lumbar spine degenerative changes. No aggressive or acute appearing osseous lesions. IMPRESSION: No acute process within the abdomen or pelvis. No nephroureterolithiasis.  No hydronephrosis. Hepatic steatosis. Infrarenal abdominal aortic aneurysm measuring 3.2 cm. Recommend followup by ultrasound in 3 years. This recommendation follows ACR consensus guidelines: White Paper of the ACR Incidental Findings Committee II on Vascular Findings. J Am Coll Radiol 2013; [id number] There is a new small predominately fatty mass within the right lower quadrant which is nonspecific in etiology however may be postsurgical given apparent interval appendectomy. Consider follow-up CT in 6 months to assess for stability. Electronically Signed   By: Lovey Newcomer M.D.   On: 08/20/2015 13:38    My personal review of EKG: Rhythm NSR, Rate  60 /min, QTc 408 , no Acute ST changes   Assessment & Plan:    Active Problems:   Polymyalgia rheumatica (HCC)   CAD (coronary artery disease)   Hyperlipidemia   HTN (hypertension)   Atrial flutter (HCC)   Elevated liver enzymes   Fever   Fever - Patient presents with fever 102.5 at home, negative urinalysis, negative chest x-ray, CT renal with no acutefindings as well, blood cultures were sent, CMV IgM , [facility] spotted fever, Lyme, and Ehrlichia antibody was sent. - Will admit overnight, continue with IV fluids, will hold on starting antibiotics, and follow on the results. - Patient complains of lower back pain,  but nontender on physical exam, this is most likely related to fever, very low suspicion of discitis.  Atrial flutter - Status post ablation, stopped Xarelto 30 days post ablation , currently only on aspirin per  Electrophysiology recommendation. - Will monitor on telemetry  History of CAD  - Status post CABG, continue with aspirin and beta blockers  Polymyalgia rheumatica - Continue with prednisone, hold methotrexate(Already to get this a.m.)  Hypertension - Acceptable, continue with home medication  Hyperlipidemia - Continue with home medication  Elevated liver enzyme - Mild ,Ultrasound abdomen with no acute findings, as is most likely in the setting of acute viral illness.  Thrombocytopenia - This is most likely in the setting of acute viral illness, monitor closely, SCD for DVT prophylaxis  Abnormal finding in CT abdomen FATTY mass in  the right lower quadrant - Will need repeat CT abdomen in 6 months to assess for stability  Infrarenal abdominal aortic aneurysm measuring 3.2 cm - Will need follow-up ultrasound in 3 years  DVT Prophylaxis SCD  AM Labs Ordered, also please review Full Orders  Family Communication: Admission, patients condition and plan of care including tests being ordered have been discussed with the patient and wife who indicate understanding and agree with the plan and Code Status.  Code Status Full  Likely DC to  Home  Condition GUARDED   Consults called: none  Admission status: observation  Time spent in minutes : 55 minutes   Elizeth Weinrich M.D on 08/20/2015 at 6:01 PM  Between 7am to 7pm - Pager - [phone]. After 7pm go to [email or web address] - password [facility]  [facility] - Office  [phone]

## 2015-08-20 NOTE — ED Provider Notes (Signed)
CSN: [id number]     Arrival date & time 08/20/15  1056 History   First MD Initiated Contact with Patient 08/20/15 1120     Chief Complaint  Patient presents with  . Back Pain  . Fever     (Consider location/radiation/quality/duration/timing/severity/associated sxs/prior Treatment) HPI   Patient is a 65 year old male with a history of CAD, RA on chronic methotrexate and presdnisone, HTN, MI, CMV who presents the ED with fevers since yesterday morning. Patient woke at 3 AM yesterday morning with a fever, chills, rigor. He took Tylenol and the fever resolved. Fever returned  again last night at 6 PM, 102.34F, and again this morning at 9:30 AM. Tylenol seems to help. Patient has also been complaining of intermittent low back pain for 2 weeks. He states the pain started last side than the right side and now it stands his entire lower back. Pain is sharp, nonradiating, worse with walking, 8/10, and better with rest. Patient denies dizziness, nausea, vomiting, abdominal pain, changes in bowel habits, dysuria or hematuria, recent illness. Pt had CMV 3 years ago and states the fevers feel the same with a similar pattern.   Past Medical History  Diagnosis Date  . Coronary disease 2001    CABG  . Hypercholesterolemia   . Polymyalgia rheumatica ([place])   . Insomnia   . Tobacco abuse   . Hypertension   . Myocardial infarction ([place])   . Pneumonia   . Atrial flutter ([place]) 09/19/2012  . 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 Cardiac catheterization  03/16/1999    ejection fraction 65%--After surgery pt. developed hives and itching  . Coronary artery bypass graft  03/19/1999    LIMA-LAD, RIMA-OM2, SVG-OM1/OM3, SVG-AM  . Trigger finger release    . Cardioversion N/A 03/28/2015    Procedure: CARDIOVERSION;  Surgeon: Skeet Latch, MD;  Location: [facility];  Service: Cardiovascular;  Laterality: N/A;  . Electrophysiologic study N/A 06/15/2015   Procedure: A-Flutter Ablation;  Surgeon: Will Meredith Leeds, MD;  Location: [facility];  Service: Cardiovascular;  Laterality: N/A;   Family History  Problem Relation Age of Onset  . Pancreatic cancer Mother   . Colon cancer Mother   . Bladder Cancer Father   . Heart failure Father    Social History  Substance Use Topics  . Smoking status: Current Some Day Smoker    Types: Cigars  . Smokeless tobacco: Never Used  . Alcohol Use: 0.6 oz/week    1 Cans of beer per week     Comment: Occasional beer    Review of Systems  Constitutional: Positive for fever, chills and diaphoresis. Negative for appetite change.  HENT: Negative for trouble swallowing and voice change.   Eyes: Negative for visual disturbance.  Respiratory: Negative for cough, chest tightness, shortness of breath and wheezing.   Cardiovascular: Negative for chest pain and leg swelling.  Gastrointestinal: Negative for nausea, vomiting, abdominal pain, diarrhea, constipation and abdominal distention.  Genitourinary: Negative for dysuria.  Musculoskeletal: Positive for back pain. Negative for neck pain.  Skin: Negative for rash.  Neurological: Negative for syncope, weakness and headaches.  Psychiatric/Behavioral: Negative for confusion.      Allergies  Oxycodone; Iodine; and Zocor  Home Medications   Prior to Admission medications   Medication Sig Start Date End Date Taking? Authorizing Provider  acetaminophen (TYLENOL) 500 MG tablet Take 500 mg by mouth every 6 (six) hours as needed for mild pain.  Yes Historical Provider, MD  acetaminophen (TYLENOL) 650 MG CR tablet Take 1,300 mg by mouth every 8 (eight) hours as needed for pain.   Yes Historical Provider, MD  albuterol (PROVENTIL HFA;VENTOLIN HFA) 108 (90 BASE) MCG/ACT inhaler Inhale 1-2 puffs into the lungs every 6 (six) hours as needed for wheezing or shortness of breath. 02/04/13  Yes Liliane Shi, PA-C  aspirin 81 MG tablet Take 81 mg by mouth  daily.   Yes Historical Provider, MD  atorvastatin (LIPITOR) 20 MG tablet Take 20 mg by mouth daily.   Yes Historical Provider, MD  folic acid (FOLVITE) 1 MG tablet Take 1 mg by mouth daily.   Yes Historical Provider, MD  lisinopril (PRINIVIL,ZESTRIL) 40 MG tablet Take 40 mg by mouth daily.   Yes Historical Provider, MD  magnesium gluconate (MAGONATE) 500 MG tablet Take 500 mg by mouth every evening.    Yes Historical Provider, MD  methotrexate (RHEUMATREX) 2.5 MG tablet Take 22.5 mg by mouth once a week. Sundays   Yes Historical Provider, MD  metoprolol (LOPRESSOR) 100 MG tablet Take 0.5 tablets (50 mg total) by mouth 2 (two) times daily. 02/04/13  Yes Liliane Shi, PA-C  Multiple Vitamin (MULTIVITAMIN WITH MINERALS) TABS tablet Take 1 tablet by mouth daily.   Yes Historical Provider, MD  Omega-3 Fatty Acids (FISH OIL) 500 MG CAPS Take 2 capsules by mouth 2 (two) times daily.   Yes Historical Provider, MD  predniSONE (DELTASONE) 1 MG tablet Take 3 mg by mouth daily with breakfast.    Yes Historical Provider, MD  nitroGLYCERIN (NITROSTAT) 0.4 MG SL tablet Place 1 tablet (0.4 mg total) under the tongue every 5 (five) minutes as needed for chest pain. 02/04/13   Liliane Shi, PA-C   BP 121/79 mmHg  Pulse 61  Temp(Src) 99.4 F (37.4 C) (Oral)  Resp 18  SpO2 92% Physical Exam  Constitutional: He is oriented to person, place, and time. He appears well-developed and well-nourished. No distress.  HENT:  Head: Normocephalic and atraumatic.  Eyes: Conjunctivae are normal.  Neck: Trachea normal and normal range of motion. Neck supple. No rigidity.  Cardiovascular: Normal rate, regular rhythm and normal heart sounds.  Exam reveals no gallop and no friction rub.   No murmur heard. Pulses:      Radial pulses are 2+ on the right side, and 2+ on the left side.       Dorsalis pedis pulses are 2+ on the right side, and 2+ on the left side.  Pulmonary/Chest: Effort normal and breath sounds normal. No  respiratory distress. He has no wheezes. He has no rales.  Abdominal: Soft. Bowel sounds are normal. He exhibits no distension. There is no tenderness. There is no rebound, no guarding and no CVA tenderness.  Musculoskeletal: Normal range of motion. He exhibits no edema.  Examination of the spine revealed no stepoff's no deformities, no ecchymosis, no TTP to the thoracic or lumbar spine, no TTP to the paraspinal muscles, mild TTP to the lateral bilateral lower back, normal gait, good ROM of thoracic and lumbar spine, strength 5/5 of bilateral lower extremities including plantar flexion and extension, sensation intact to light touch, no pain with straight leg raise  Neurological: He is alert and oriented to person, place, and time. Coordination normal.  Skin: Skin is warm. No rash noted. He is diaphoretic.  Psychiatric: He has a normal mood and affect. His behavior is normal.  Nursing note and vitals reviewed.   ED Course  Procedures (including critical care time) Labs Review Labs Reviewed  COMPREHENSIVE METABOLIC PANEL - Abnormal; Notable for the following:    Sodium 131 (*)    CO2 18 (*)    Glucose, Bld 104 (*)    Total Protein 5.9 (*)    AST 71 (*)    ALT 68 (*)    Total Bilirubin 2.0 (*)    All other components within normal limits  URINALYSIS, ROUTINE W REFLEX MICROSCOPIC (NOT AT [facility]) - Abnormal; Notable for the following:    Color, Urine AMBER (*)    Hgb urine dipstick LARGE (*)    Ketones, ur 15 (*)    All other components within normal limits  URINE MICROSCOPIC-ADD ON - Abnormal; Notable for the following:    Squamous Epithelial / LPF 0-5 (*)    Bacteria, UA FEW (*)    All other components within normal limits  CBC WITH DIFFERENTIAL/PLATELET - Abnormal; Notable for the following:    Hemoglobin 17.6 (*)    Platelets 75 (*)    Lymphs Abs 0.6 (*)    All other components within normal limits  C-REACTIVE PROTEIN - Abnormal; Notable for the following:    CRP 4.3 (*)    All  other components within normal limits  CBC - Abnormal; Notable for the following:    WBC 3.3 (*)    Platelets 76 (*)    All other components within normal limits  COMPREHENSIVE METABOLIC PANEL - Abnormal; Notable for the following:    Calcium 8.7 (*)    Total Protein 5.1 (*)    Albumin 3.0 (*)    AST 57 (*)    All other components within normal limits  CBC - Abnormal; Notable for the following:    WBC 3.7 (*)    Platelets 70 (*)    All other components within normal limits  URINE CULTURE  CULTURE, BLOOD (ROUTINE X 2)  CULTURE, BLOOD (ROUTINE X 2)  SEDIMENTATION RATE  CREATININE, SERUM  CMV IGM  EHRLICHIA ANTIBODY PANEL  ROCKY MTN SPOTTED FVR ABS PNL(IGG+IGM)  B. BURGDORFI ANTIBODIES  I-STAT CG4 LACTIC ACID, ED  I-STAT CG4 LACTIC ACID, ED    Imaging Review Dg Chest 2 View  08/20/2015  CLINICAL DATA:  Fever of 102, chills. EXAM: CHEST  2 VIEW COMPARISON:  03/23/2015 FINDINGS: Postsurgical changes from CABG, stable. Cardiomediastinal silhouette is normal. Mediastinal contours appear intact. There is no evidence of focal airspace consolidation, pleural effusion or pneumothorax. Osseous structures are without acute abnormality. Soft tissues are grossly normal. IMPRESSION: No active cardiopulmonary disease. Electronically Signed   By: Fidela Salisbury M.D.   On: 08/20/2015 11:41   US Abdomen Complete  08/20/2015  CLINICAL DATA:  Hepatic steatosis. Increased LFTs, low back pain, fever and chills. EXAM: ABDOMEN ULTRASOUND COMPLETE COMPARISON:  CT of the abdomen pelvis 08/20/2015 FINDINGS: Gallbladder: No gallstones or wall thickening visualized. No sonographic Murphy sign noted by sonographer. Common bile duct: Diameter: 4.2 mm Liver: No focal lesion identified. Within normal limits in parenchymal echogenicity. IVC: No abnormality visualized. Pancreas: Visualized portion unremarkable. Spleen: Size and appearance within normal limits. Right Kidney: Length: 12.7 cm. Echogenicity within  normal limits. No mass or hydronephrosis visualized. Left Kidney: Length: 12.6 cm. Echogenicity within normal limits. No mass or hydronephrosis visualized. Abdominal aorta: Mild fusiform aneurysmal dilation of the distal abdominal aorta is noted measuring 3.2 cm Other findings: None. IMPRESSION: No evidence of acute cholecystitis. Normal appearance of the solid abdominal organs sonographically, accounting for poor visualization of  the pancreas, which was partially obscured by bowel gas. Mild fusiform aneurysmal dilation of the distal abdominal aorta with maximum diameter of 3.2 cm. Electronically Signed   By: Fidela Salisbury M.D.   On: 08/20/2015 16:16   Ct Renal Stone Study  08/20/2015  CLINICAL DATA:  Patient with low back pain, fever and chills for 3 days. EXAM: CT ABDOMEN AND PELVIS WITHOUT CONTRAST TECHNIQUE: Multidetector CT imaging of the abdomen and pelvis was performed following the standard protocol without IV contrast. COMPARISON:  CT abdomen pelvis 09/18/2012. FINDINGS: Lower chest: Normal heart size. Dependent atelectasis within the bilateral lower lobes. No pleural effusion. Hepatobiliary: Liver is normal in size and contour. Liver is diffusely low in attenuation. Gallbladder is unremarkable. Pancreas: Mild fatty atrophy. Spleen: Unremarkable Adrenals/Urinary Tract: Grossly unchanged 1.1 cm right adrenal adenoma. Left adrenal gland is normal. Stable bilateral perinephric fat stranding. No nephroureterolithiasis. No hydronephrosis. Urinary bladder is unremarkable. Stomach/Bowel: No abnormal bowel wall thickening are evidence for bowel obstruction. No free fluid or free intraperitoneal air. Small hiatal hernia. Normal morphology stomach. Vascular/Lymphatic: Aneurysmal dilatation of the abdominal aorta measuring 3.2 cm. Peripheral calcified noncalcified atherosclerotic plaque. No retroperitoneal lymphadenopathy. Other: Left-greater-than-right fat containing inguinal hernias. Within the right lower  quadrant there is a new 1.6 cm fatty appearing mass with peripheral high attenuation (image 67; series 2), new from prior. Musculoskeletal: Lumbar spine degenerative changes. No aggressive or acute appearing osseous lesions. IMPRESSION: No acute process within the abdomen or pelvis. No nephroureterolithiasis.  No hydronephrosis. Hepatic steatosis. Infrarenal abdominal aortic aneurysm measuring 3.2 cm. Recommend followup by ultrasound in 3 years. This recommendation follows ACR consensus guidelines: White Paper of the ACR Incidental Findings Committee II on Vascular Findings. J Am Coll Radiol 2013; [phone] There is a new small predominately fatty mass within the right lower quadrant which is nonspecific in etiology however may be postsurgical given apparent interval appendectomy. Consider follow-up CT in 6 months to assess for stability. Electronically Signed   By: Lovey Newcomer M.D.   On: 08/20/2015 13:38   I have personally reviewed and evaluated these images and lab results as part of my medical decision-making.   EKG Interpretation   Date/Time:  Sunday August 20 2015 12:56:22 EDT Ventricular Rate:  60 PR Interval:    QRS Duration: 108 QT Interval:  408 QTC Calculation: 408 R Axis:   78 Text Interpretation:  Sinus rhythm Normal ECG Confirmed by Carmin Muskrat  MD ([id number]) on 08/20/2015 1:51:10 PM      MDM   Final diagnoses:  Fever, unspecified fever cause  Thrombocytopenia (HCC)  Hyponatremia  Elevated LFTs  Hyperbilirubinemia    Pt with fever for one day and back pain for 2 weeks. Pt took 1300mg  tylenol prior to arrival to ED. VSS. Labs revealed hematuria, thrombocytopenia, hyperbilirubinemia, hyponatremia, elevated LFT's, elevated hemoglobin.   Back pain worse with movement. Reproducible on exam and does not radiate. Pt states he slept on his couch one night and the next morning he was without back pain. Pt believes the back pain could be his mattress. Likely his back pain is  Musculoskeletal.  Ct renal without signs of nephrolithiasis, and no acute process in the abdomen or pelvis. Nonspecific fatty mass visualized in the RLQ. US abdomen without any acute abnormalities. CT and US revealed an aortic aneurysm. Discussed results of the CT with the pt and instructed him to follow up with PCP regarding the aortic aneurysm and the fatty mass.   Unsure at this point of the  etiology of the pt's fever.   Pt care was signed out to [employer], PA-C at the change of shift.     Kalman Drape, PA 08/21/15 0740  Carmin Muskrat, MD 08/21/15 [phone]

## 2015-08-21 ENCOUNTER — Observation Stay (HOSPITAL_COMMUNITY): Payer: Medicare Other

## 2015-08-21 ENCOUNTER — Encounter (HOSPITAL_COMMUNITY): Payer: Self-pay | Admitting: *Deleted

## 2015-08-21 DIAGNOSIS — I2510 Atherosclerotic heart disease of native coronary artery without angina pectoris: Secondary | ICD-10-CM

## 2015-08-21 DIAGNOSIS — R748 Abnormal levels of other serum enzymes: Secondary | ICD-10-CM

## 2015-08-21 DIAGNOSIS — R509 Fever, unspecified: Secondary | ICD-10-CM | POA: Diagnosis not present

## 2015-08-21 DIAGNOSIS — E785 Hyperlipidemia, unspecified: Secondary | ICD-10-CM

## 2015-08-21 DIAGNOSIS — M353 Polymyalgia rheumatica: Secondary | ICD-10-CM

## 2015-08-21 DIAGNOSIS — I483 Typical atrial flutter: Secondary | ICD-10-CM

## 2015-08-21 DIAGNOSIS — I10 Essential (primary) hypertension: Secondary | ICD-10-CM

## 2015-08-21 LAB — COMPREHENSIVE METABOLIC PANEL
ALT: 63 U/L (ref 17–63)
AST: 57 U/L — ABNORMAL HIGH (ref 15–41)
Albumin: 3 g/dL — ABNORMAL LOW (ref 3.5–5.0)
Alkaline Phosphatase: 82 U/L (ref 38–126)
Anion gap: 7 (ref 5–15)
BUN: 12 mg/dL (ref 6–20)
CO2: 24 mmol/L (ref 22–32)
Calcium: 8.7 mg/dL — ABNORMAL LOW (ref 8.9–10.3)
Chloride: 106 mmol/L (ref 101–111)
Creatinine, Ser: 0.87 mg/dL (ref 0.61–1.24)
GFR calc Af Amer: >60 mL/min (ref >60)
GFR calc non Af Amer: >60 mL/min (ref >60)
Glucose, Bld: 98 mg/dL (ref 65–99)
Potassium: 4.8 mmol/L (ref 3.5–5.1)
Sodium: 137 mmol/L (ref 135–145)
Total Bilirubin: 1.2 mg/dL (ref 0.3–1.2)
Total Protein: 5.1 g/dL — ABNORMAL LOW (ref 6.5–8.1)

## 2015-08-21 LAB — CBC
HCT: 47.6 % (ref 39.0–52.0)
Hemoglobin: 15.8 g/dL (ref 13.0–17.0)
MCH: 32.5 pg (ref 26.0–34.0)
MCHC: 33.2 g/dL (ref 30.0–36.0)
MCV: 97.9 fL (ref 78.0–100.0)
PLATELETS: 70 10*3/uL — AB (ref 150–400)
RBC: 4.86 MIL/uL (ref 4.22–5.81)
RDW: 14 % (ref 11.5–15.5)
WBC: 3.7 10*3/uL — ABNORMAL LOW (ref 4.0–10.5)

## 2015-08-21 LAB — CMV IGM: CMV IgM: <30 AU/mL (ref 0.0–29.9)

## 2015-08-21 LAB — URINE CULTURE: Culture: NO GROWTH

## 2015-08-21 MED ORDER — ZOLPIDEM TARTRATE 5 MG PO TABS
5.0000 mg | ORAL_TABLET | Freq: Every evening | ORAL | Status: DC | PRN
  Administered 2015-08-21: 5 mg via ORAL
  Filled 2015-08-21: qty 1

## 2015-08-21 MED ORDER — DOXYCYCLINE HYCLATE 100 MG PO TABS
100.0000 mg | ORAL_TABLET | Freq: Two times a day (BID) | ORAL | Status: DC
  Administered 2015-08-21 – 2015-08-22 (×3): 100 mg via ORAL
  Filled 2015-08-21 (×3): qty 1

## 2015-08-21 NOTE — Progress Notes (Addendum)
Progress Note    William [name]  FYB:[id number] DOB: 1950-12-06  DOA: 08/20/2015 PCP: William Rim, MD    Brief Narrative:   William [name] is an 65 y.o. 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 Initial evaluation in the ED showed mildly elevated LFTs and thrombocytopenia.  Assessment/Plan:   Principal problem:  Fever Patient presents with fever 102.5 at home, negative urinalysis, negative chest x-ray, CT renal with no acute findings as well. Follow-up blood cultures, CMV IgM , [facility] spotted fever, Lyme, and Ehrlichia antibodies. ESR not elevated making discitis unlikely, but would rule this out with an MRI given immunocompromised status and no other obvious source. WBC not elevated. Afebrile overnight. Would start empiric doxycycline for treatment of possible tickborne illness given recent camping trip and tick exposure.  Active problems:  Atrial flutter Status post ablation, stopped Xarelto 30 days post ablation, currently only on aspirin perElectrophysiology recommendations. Continue to monitor on telemetry.  History of CAD  Status post CABG, continue with aspirin and beta blockers.  Polymyalgia rheumatica Continue with prednisone, hold methotrexate.  Hypertension Acceptable, continue with home medication.  Hyperlipidemia Continue with home medication.  Elevated liver enzyme Mild ,Ultrasound abdomen with no acute findings, as is most likely in the setting of acute viral illness.  Thrombocytopenia This is most likely in the setting of acute viral illness, monitor closely, SCD for DVT prophylaxis.  Abnormal finding in CT abdomen FATTY  mass in the right lower quadrant Will need repeat CT abdomen in 6 months to assess for stability.  Infrarenal abdominal aortic aneurysm measuring 3.2 cm Will need follow-up ultrasound in 3 years.  Family Communication/Anticipated D/C date and plan/Code Status   DVT prophylaxis: SCDs ordered. Code Status: Full Code.  Family Communication: Wife updated at the bedside. Disposition Plan: Possibly home 08/22/15 if he remains afebrile.   Medical Consultants:    None.   Procedures:    Anti-Infectives:   Anti-infectives    None      Subjective:   William [name] reports 8/10 back pain radiating to bilateral hips.  No fevers overnight.  Reports recent problem with headaches which he typically doesn't have.  Went camping recently and had a likely tick bite on his back.  No rash.  Had a diaphoretic spell in the night.  Fevers similar to when he had CMV in the past.  Objective:    Filed Vitals:   08/20/15 1830 08/20/15 1953 08/20/15 2234 08/21/15 0416  BP: 150/90 146/58 127/63 155/75  Pulse: 64 59 67 51  Temp:  99.8 F (37.7 C)  98.3 F (36.8 C)  TempSrc:  Oral  Oral  Resp: '23 18  18  ' Height:  '5\' 11"'  (1.803 m)    Weight:  102.105 kg (225 lb 1.6 oz)    SpO2: 97% 99%  100%   No intake or output data in the 24 hours ending 08/21/15 0836 Filed Weights   08/20/15 1953  Weight: 102.105 kg (225 lb 1.6 oz)    Exam: General exam: Appears calm and comfortable.  Respiratory system: Clear to auscultation. Respiratory effort normal. Cardiovascular system: S1 & S2 heard, RRR. No JVD,  rubs, gallops or clicks.  No murmurs. Gastrointestinal system: Abdomen is nondistended, soft and nontender. No organomegaly or masses felt. Normal bowel sounds heard. Central nervous system: Alert and oriented. No focal neurological deficits. Extremities: No clubbing, edema, or cyanosis. Skin: No rashes, lesions or ulcers Psychiatry: Judgement and insight appear normal. Mood & affect appropriate.    Data Reviewed:   I have personally reviewed following labs and imaging studies:  Labs: Basic Metabolic Panel:  Recent Labs Lab 08/20/15 1154 08/20/15 1958 08/21/15 0347  NA 131*  --  137  K 4.4  --  4.8  CL 104  --  106  CO2 18*  --  24  GLUCOSE 104*  --  98  BUN 14  --  12  CREATININE 0.90 1.06 0.87  CALCIUM 9.0  --  8.7*   GFR Estimated Creatinine Clearance: 101.6 mL/min (by C-G formula based on Cr of 0.87). Liver Function Tests:  Recent Labs Lab 08/20/15 1154 08/21/15 0347  AST 71* 57*  ALT 68* 63  ALKPHOS 95 82  BILITOT 2.0* 1.2  PROT 5.9* 5.1*  ALBUMIN 3.6 3.0*   CBC:  Recent Labs Lab 08/20/15 1154 08/20/15 1958 08/21/15 0347  WBC 5.0 3.3* 3.7*  NEUTROABS 3.6  --   --   HGB 17.6* 15.8 15.8  HCT 51.4 47.5 47.6  MCV 95.9 96.5 97.9  PLT 75* 76* 70*   Sepsis Labs:  Recent Labs Lab 08/20/15 1154 08/20/15 1427 08/20/15 1958 08/21/15 0347  WBC 5.0  --  3.3* 3.7*  LATICACIDVEN 1.29 0.98  --   --    Urine analysis:    Component Value Date/Time   COLORURINE AMBER* 08/20/2015 1115   APPEARANCEUR CLEAR 08/20/2015 1115   LABSPEC 1.027 08/20/2015 1115   PHURINE 5.5 08/20/2015 1115   GLUCOSEU NEGATIVE 08/20/2015 1115   HGBUR LARGE* 08/20/2015 1115   BILIRUBINUR NEGATIVE 08/20/2015 1115   KETONESUR 15* 08/20/2015 1115   PROTEINUR NEGATIVE 08/20/2015 1115   UROBILINOGEN 1.0 09/17/2012 1950   NITRITE NEGATIVE 08/20/2015 1115   LEUKOCYTESUR NEGATIVE 08/20/2015 1115   Microbiology No results found for this or any previous visit (from the past 240 hour(s)).  Radiology: Dg Chest 2 View  08/20/2015  CLINICAL DATA:  Fever of 102, chills. EXAM: CHEST  2 VIEW COMPARISON:  03/23/2015 FINDINGS: Postsurgical changes from CABG, stable. Cardiomediastinal silhouette is normal. Mediastinal contours appear intact. There is no evidence of focal airspace consolidation, pleural effusion or pneumothorax. Osseous structures are without acute abnormality. Soft  tissues are grossly normal. IMPRESSION: No active cardiopulmonary disease. Electronically Signed   By: Fidela Salisbury M.D.   On: 08/20/2015 11:41   US Abdomen Complete  08/20/2015  CLINICAL DATA:  Hepatic steatosis. Increased LFTs, low back pain, fever and chills. EXAM: ABDOMEN ULTRASOUND COMPLETE COMPARISON:  CT of the abdomen pelvis 08/20/2015 FINDINGS: Gallbladder: No gallstones or wall thickening visualized. No sonographic Murphy sign noted by sonographer. Common bile duct: Diameter: 4.2 mm Liver: No focal lesion identified. Within normal limits in parenchymal echogenicity. IVC: No abnormality visualized. Pancreas: Visualized portion unremarkable. Spleen: Size and appearance within normal limits. Right Kidney: Length: 12.7 cm. Echogenicity within normal limits. No mass or hydronephrosis visualized. Left Kidney: Length: 12.6 cm. Echogenicity within normal limits. No mass or hydronephrosis visualized. Abdominal aorta: Mild fusiform aneurysmal dilation of the distal abdominal aorta is noted measuring 3.2 cm Other findings: None. IMPRESSION: No evidence of acute cholecystitis. Normal appearance of the solid abdominal organs sonographically, accounting for poor visualization of the pancreas, which was partially obscured  by bowel gas. Mild fusiform aneurysmal dilation of the distal abdominal aorta with maximum diameter of 3.2 cm. Electronically Signed   By: Fidela Salisbury M.D.   On: 08/20/2015 16:16   Ct Renal Stone Study  08/20/2015  CLINICAL DATA:  Patient with low back pain, fever and chills for 3 days. EXAM: CT ABDOMEN AND PELVIS WITHOUT CONTRAST TECHNIQUE: Multidetector CT imaging of the abdomen and pelvis was performed following the standard protocol without IV contrast. COMPARISON:  CT abdomen pelvis 09/18/2012. FINDINGS: Lower chest: Normal heart size. Dependent atelectasis within the bilateral lower lobes. No pleural effusion. Hepatobiliary: Liver is normal in size and contour. Liver is  diffusely low in attenuation. Gallbladder is unremarkable. Pancreas: Mild fatty atrophy. Spleen: Unremarkable Adrenals/Urinary Tract: Grossly unchanged 1.1 cm right adrenal adenoma. Left adrenal gland is normal. Stable bilateral perinephric fat stranding. No nephroureterolithiasis. No hydronephrosis. Urinary bladder is unremarkable. Stomach/Bowel: No abnormal bowel wall thickening are evidence for bowel obstruction. No free fluid or free intraperitoneal air. Small hiatal hernia. Normal morphology stomach. Vascular/Lymphatic: Aneurysmal dilatation of the abdominal aorta measuring 3.2 cm. Peripheral calcified noncalcified atherosclerotic plaque. No retroperitoneal lymphadenopathy. Other: Left-greater-than-right fat containing inguinal hernias. Within the right lower quadrant there is a new 1.6 cm fatty appearing mass with peripheral high attenuation (image 67; series 2), new from prior. Musculoskeletal: Lumbar spine degenerative changes. No aggressive or acute appearing osseous lesions. IMPRESSION: No acute process within the abdomen or pelvis. No nephroureterolithiasis.  No hydronephrosis. Hepatic steatosis. Infrarenal abdominal aortic aneurysm measuring 3.2 cm. Recommend followup by ultrasound in 3 years. This recommendation follows ACR consensus guidelines: White Paper of the ACR Incidental Findings Committee II on Vascular Findings. J Am Coll Radiol 2013; [phone] There is a new small predominately fatty mass within the right lower quadrant which is nonspecific in etiology however may be postsurgical given apparent interval appendectomy. Consider follow-up CT in 6 months to assess for stability. Electronically Signed   By: Lovey Newcomer M.D.   On: 08/20/2015 13:38    Medications:   . aspirin EC  81 mg Oral Daily  . atorvastatin  20 mg Oral q1800  . folic acid  1 mg Oral Daily  . lisinopril  40 mg Oral Daily  . magnesium gluconate  500 mg Oral QPM  . metoprolol  50 mg Oral BID  . multivitamin with  minerals  1 tablet Oral Daily  . omega-3 acid ethyl esters  1 g Oral BID  . predniSONE  3 mg Oral Q breakfast  . sodium chloride flush  3 mL Intravenous Q12H   Continuous Infusions: . sodium chloride 100 mL/hr at 08/20/15 2016    Time spent: 35 minutes with > 50% of time discussing current diagnostic test results, clinical impression and plan of care.     [facility] Pager [phone]. If unable to reach me by pager, please call my cell phone at [phone].  *Please refer to [email or web address], password [email or web address] to get updated schedule on who will round on this patient, as hospitalists switch teams weekly. If 7PM-7AM, please contact night-coverage at [email or web address], password [email or web address] for any overnight needs.  08/21/2015, 8:36 AM

## 2015-08-21 NOTE — Progress Notes (Signed)
PER ANESTHESIA PT CAN BE DONE ON 08/24/15 @ 10am, THIS IS THE EARLIEST, RN ANGELICA IS AWARE AND STATED THAT SHE WILL CONTACT MD TO SEE WHAT THEY WANT TO DO

## 2015-08-21 NOTE — Care Management Obs Status (Signed)
[place] NOTIFICATION   Patient Details  Name: SEVION SHUTT MRN: [id number] Date of Birth: 1950-09-29   Medicare Observation Status Notification Given:  Yes    Dawayne Patricia, RN 08/21/2015, 11:19 AM

## 2015-08-22 ENCOUNTER — Encounter (HOSPITAL_COMMUNITY): Payer: Self-pay | Admitting: Internal Medicine

## 2015-08-22 DIAGNOSIS — I714 Abdominal aortic aneurysm, without rupture, unspecified: Secondary | ICD-10-CM | POA: Diagnosis present

## 2015-08-22 DIAGNOSIS — R509 Fever, unspecified: Secondary | ICD-10-CM | POA: Diagnosis not present

## 2015-08-22 DIAGNOSIS — I483 Typical atrial flutter: Secondary | ICD-10-CM | POA: Diagnosis not present

## 2015-08-22 DIAGNOSIS — R1903 Right lower quadrant abdominal swelling, mass and lump: Secondary | ICD-10-CM | POA: Diagnosis not present

## 2015-08-22 DIAGNOSIS — D696 Thrombocytopenia, unspecified: Secondary | ICD-10-CM | POA: Diagnosis present

## 2015-08-22 DIAGNOSIS — I4892 Unspecified atrial flutter: Secondary | ICD-10-CM

## 2015-08-22 HISTORY — DX: Right lower quadrant abdominal swelling, mass and lump: R19.03

## 2015-08-22 HISTORY — DX: Abdominal aortic aneurysm, without rupture: I71.4

## 2015-08-22 HISTORY — DX: Abdominal aortic aneurysm, without rupture, unspecified: I71.40

## 2015-08-22 LAB — B. BURGDORFI ANTIBODIES

## 2015-08-22 MED ORDER — DOXYCYCLINE HYCLATE 100 MG PO TABS: 100.0000 mg | ORAL_TABLET | Freq: Two times a day (BID) | ORAL | Status: DC

## 2015-08-22 NOTE — Discharge Instructions (Signed)
Fever, Adult °A fever is an increase in the body's temperature. It is often defined as a temperature of 100° F (38°C) or higher. Short mild or moderate fevers often have no long-term effects. They also often do not need treatment. Moderate or high fevers may make you feel uncomfortable. Sometimes, they can also be a sign of a serious illness or disease. The sweating that may happen with repeated fevers or fevers that last a while may also cause you to not have enough fluid in your body (dehydration). °You can take your temperature with a thermometer to see if you have a fever. A measured temperature can change with: °· Age. °· Time of day. °· Where the thermometer is placed: °¨ Mouth (oral). °¨ Rectum (rectal). °¨ Ear (tympanic). °¨ Underarm (axillary). °¨ Forehead (temporal). °HOME CARE °Pay attention to any changes in your symptoms. Take these actions to help with your condition: °· Take over-the-counter and prescription medicines only as told by your doctor. Follow the dosing instructions carefully. °· If you were prescribed an antibiotic medicine, take it as told by your doctor. Do not stop taking the antibiotic even if you start to feel better. °· Rest as needed. °· Drink enough fluid to keep your pee (urine) clear or pale yellow. °· Sponge yourself or bathe with room-temperature water as needed. This helps to lower your body temperature . Do not use ice water. °· Do not wear too many blankets or heavy clothes. °GET HELP IF: °· You throw up (vomit). °· You cannot eat or drink without throwing up. °· You have watery poop (diarrhea). °· It hurts when you pee. °· Your symptoms do not get better with treatment. °· You have new symptoms. °· You feel very weak. °GET HELP RIGHT AWAY IF: °· You are short of breath or have trouble breathing. °· You are dizzy or you pass out (faint). °· You feel confused. °· You have signs of not having enough fluid in your body, such as: °¨ A dry mouth. °¨ Peeing less. °¨ Looking  pale. °· You have very bad pain in your belly (abdomen). °· You keep throwing up or having water poop. °· You have a skin rash. °· Your symptoms suddenly get worse. °  °This information is not intended to replace advice given to you by your health care provider. Make sure you discuss any questions you have with your health care provider. °  °Document Released: 11/28/2007 Document Revised: 11/09/2014 Document Reviewed: 04/14/2014 °Elsevier Interactive Patient Education ©2016 [employer] ° °

## 2015-08-22 NOTE — Discharge Summary (Signed)
Physician Discharge Summary  William [name] YIR:[id number] DOB: 04-Sep-1950 DOA: 08/20/2015  PCP: [name], MD  Admit date: 08/20/2015 Discharge date: 08/22/2015   Recommendations for Outpatient Follow-Up:   1. Patient was scheduled for an outpatient open MRI of his lumbar spine. Results will be faxed to his PCP and the patient was instructed to follow-up with his PCP for these results. 2. PCP: Please follow-up on final blood culture results, [place] spotted fever and Erlichia antibodies. 3. Repeat CT of the abdomen needed in 6 months to follow-up right lower quadrant fatty mass. 4. Routine surveillance of AAA needed in 3 years. 5. Repeat CBC in 1 week to evaluate for worsening thrombocytopenia.   Discharge Diagnosis:   Principal Problem:    Fever with severe back pain Active Problems:    Polymyalgia rheumatica (HCC)    CAD (coronary artery disease)    Hyperlipidemia    HTN (hypertension)    Atrial flutter (HCC)    Elevated liver enzymes    Thrombocytopenia    AAA    Fatty mass right lower quadrant   Discharge disposition:  Home.    Discharge Condition: Improved.  Diet recommendation: Low sodium, heart healthy.     History of Present Illness:   William [name] is an 65 y.o. 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 Initial evaluation in the ED showed mildly elevated LFTs and thrombocytopenia.  Hospital Course by Problem:   Principal problem:  Fever with back pain Patient presented with fever 102.5 at home, negative urinalysis, negative chest x-ray, CT renal with no acute findings as well. Blood cultures negative to date, CMV  IgM negative, Lyme serologies negative. Follow-up [facility] spotted fever and Ehrlichia antibodies. ESR not elevated making discitis unlikely, but would rule this out with an MRI given immunocompromised status and no other obvious source. WBC not elevated. Afebrile throughout admission. Empiric doxycycline initiated 08/21/15 for treatment of possible tickborne illness given recent camping trip and tick exposure. Can discontinue if [facility] spotted fever and Erlichia antibodies are negative. Outpatient MRI of lumbar spine scheduled.  Active problems:  Atrial flutter Status post ablation, stopped Xarelto 30 days post ablation, currently only on aspirin perElectrophysiology recommendations. Continue to monitor on telemetry.  History of CAD  Status post CABG, continue with aspirin and beta blockers.  Polymyalgia rheumatica Continue with prednisone, hold methotrexate.  Hypertension Acceptable, continue with home medication.  Hyperlipidemia Continue with home medication.  Elevated liver enzyme Mild ,Ultrasound abdomen with no acute findings, as is most likely in the setting of acute viral illness.  Thrombocytopenia This is most likely in the setting of acute viral illness, monitor closely, SCD for DVT prophylaxis.  Abnormal finding in CT abdomen FATTY mass in the right lower quadrant Will need repeat CT abdomen in 6 months to assess for stability.  Infrarenal abdominal aortic aneurysm measuring 3.2 cm Recommend abdominal ultrasound in 3 years to assess stability.    Medical Consultants:    None.   Discharge Exam:   Filed Vitals:   08/21/15 1956 08/22/15 0507  BP: 139/61 157/78  Pulse:  54  Temp: 99.2 F (37.3 C) 98.1 F (36.7 C)  Resp: 20 20  Filed Vitals:   08/21/15 0416 08/21/15 1200 08/21/15 1956 08/22/15 0507  BP: 155/75 146/65 139/61 157/78  Pulse: 51 57  54  Temp: 98.3 F (36.8 C) 98.5 F (36.9 C) 99.2 F (37.3 C) 98.1 F (36.7 C)  TempSrc: Oral  Oral Oral Oral  Resp: '18 17 20 20  ' Height:      Weight:      SpO2: 100% 99% 100% 100%    Gen:  NAD Cardiovascular:  RRR, No M/R/G Respiratory: Lungs CTAB Gastrointestinal: Abdomen soft, NT/ND with normal active bowel sounds. Extremities: No C/E/C   The results of significant diagnostics from this hospitalization (including imaging, microbiology, ancillary and laboratory) are listed below for reference.     Procedures and Diagnostic Studies:   Dg Chest 2 View  08/20/2015  CLINICAL DATA:  Fever of 102, chills. EXAM: CHEST  2 VIEW COMPARISON:  03/23/2015 FINDINGS: Postsurgical changes from CABG, stable. Cardiomediastinal silhouette is normal. Mediastinal contours appear intact. There is no evidence of focal airspace consolidation, pleural effusion or pneumothorax. Osseous structures are without acute abnormality. Soft tissues are grossly normal. IMPRESSION: No active cardiopulmonary disease. Electronically Signed   By: Fidela Salisbury M.D.   On: 08/20/2015 11:41   US Abdomen Complete  08/20/2015  CLINICAL DATA:  Hepatic steatosis. Increased LFTs, low back pain, fever and chills. EXAM: ABDOMEN ULTRASOUND COMPLETE COMPARISON:  CT of the abdomen pelvis 08/20/2015 FINDINGS: Gallbladder: No gallstones or wall thickening visualized. No sonographic Murphy sign noted by sonographer. Common bile duct: Diameter: 4.2 mm Liver: No focal lesion identified. Within normal limits in parenchymal echogenicity. IVC: No abnormality visualized. Pancreas: Visualized portion unremarkable. Spleen: Size and appearance within normal limits. Right Kidney: Length: 12.7 cm. Echogenicity within normal limits. No mass or hydronephrosis visualized. Left Kidney: Length: 12.6 cm. Echogenicity within normal limits. No mass or hydronephrosis visualized. Abdominal aorta: Mild fusiform aneurysmal dilation of the distal abdominal aorta is noted measuring 3.2 cm Other findings: None. IMPRESSION: No evidence of acute cholecystitis.  Normal appearance of the solid abdominal organs sonographically, accounting for poor visualization of the pancreas, which was partially obscured by bowel gas. Mild fusiform aneurysmal dilation of the distal abdominal aorta with maximum diameter of 3.2 cm. Electronically Signed   By: Fidela Salisbury M.D.   On: 08/20/2015 16:16   Ct Renal Stone Study  08/20/2015  CLINICAL DATA:  Patient with low back pain, fever and chills for 3 days. EXAM: CT ABDOMEN AND PELVIS WITHOUT CONTRAST TECHNIQUE: Multidetector CT imaging of the abdomen and pelvis was performed following the standard protocol without IV contrast. COMPARISON:  CT abdomen pelvis 09/18/2012. FINDINGS: Lower chest: Normal heart size. Dependent atelectasis within the bilateral lower lobes. No pleural effusion. Hepatobiliary: Liver is normal in size and contour. Liver is diffusely low in attenuation. Gallbladder is unremarkable. Pancreas: Mild fatty atrophy. Spleen: Unremarkable Adrenals/Urinary Tract: Grossly unchanged 1.1 cm right adrenal adenoma. Left adrenal gland is normal. Stable bilateral perinephric fat stranding. No nephroureterolithiasis. No hydronephrosis. Urinary bladder is unremarkable. Stomach/Bowel: No abnormal bowel wall thickening are evidence for bowel obstruction. No free fluid or free intraperitoneal air. Small hiatal hernia. Normal morphology stomach. Vascular/Lymphatic: Aneurysmal dilatation of the abdominal aorta measuring 3.2 cm. Peripheral calcified noncalcified atherosclerotic plaque. No retroperitoneal lymphadenopathy. Other: Left-greater-than-right fat containing inguinal hernias. Within the right lower quadrant there is a new 1.6 cm fatty appearing mass with peripheral high attenuation (image 67; series 2), new from prior. Musculoskeletal: Lumbar spine degenerative changes. No aggressive or acute appearing osseous lesions. IMPRESSION: No  acute process within the abdomen or pelvis. No nephroureterolithiasis.  No hydronephrosis.  Hepatic steatosis. Infrarenal abdominal aortic aneurysm measuring 3.2 cm. Recommend followup by ultrasound in 3 years. This recommendation follows ACR consensus guidelines: White Paper of the ACR Incidental Findings Committee II on Vascular Findings. J Am Coll Radiol 2013; [phone] There is a new small predominately fatty mass within the right lower quadrant which is nonspecific in etiology however may be postsurgical given apparent interval appendectomy. Consider follow-up CT in 6 months to assess for stability. Electronically Signed   By: Lovey Newcomer M.D.   On: 08/20/2015 13:38     Labs:   Basic Metabolic Panel:  Recent Labs Lab 08/20/15 1154 08/20/15 1958 08/21/15 0347  NA 131*  --  137  K 4.4  --  4.8  CL 104  --  106  CO2 18*  --  24  GLUCOSE 104*  --  98  BUN 14  --  12  CREATININE 0.90 1.06 0.87  CALCIUM 9.0  --  8.7*   GFR Estimated Creatinine Clearance: 101.6 mL/min (by C-G formula based on Cr of 0.87). Liver Function Tests:  Recent Labs Lab 08/20/15 1154 08/21/15 0347  AST 71* 57*  ALT 68* 63  ALKPHOS 95 82  BILITOT 2.0* 1.2  PROT 5.9* 5.1*  ALBUMIN 3.6 3.0*   CBC:  Recent Labs Lab 08/20/15 1154 08/20/15 1958 08/21/15 0347  WBC 5.0 3.3* 3.7*  NEUTROABS 3.6  --   --   HGB 17.6* 15.8 15.8  HCT 51.4 47.5 47.6  MCV 95.9 96.5 97.9  PLT 75* 76* 70*   Microbiology Recent Results (from the past 240 hour(s))  Urine culture     Status: None   Collection Time: 08/20/15 11:15 AM  Result Value Ref Range Status   Specimen Description URINE, CLEAN CATCH  Final   Special Requests NONE  Final   Culture NO GROWTH  Final   Report Status 08/21/2015 FINAL  Final  Culture, blood (Routine X 2)     Status: None (Preliminary result)   Collection Time: 08/20/15 11:48 AM  Result Value Ref Range Status   Specimen Description BLOOD RIGHT ANTECUBITAL  Final   Special Requests BOTTLES DRAWN AEROBIC AND ANAEROBIC 5CC  Final   Culture NO GROWTH 2 DAYS  Final   Report  Status PENDING  Incomplete  Culture, blood (Routine X 2)     Status: None (Preliminary result)   Collection Time: 08/20/15 11:55 AM  Result Value Ref Range Status   Specimen Description BLOOD RIGHT HAND  Final   Special Requests BOTTLES DRAWN AEROBIC AND ANAEROBIC 5CC  Final   Culture NO GROWTH 2 DAYS  Final   Report Status PENDING  Incomplete     Discharge Instructions:   Discharge Instructions    Call MD for:  extreme fatigue    Complete by:  As directed      Call MD for:  severe uncontrolled pain    Complete by:  As directed      Call MD for:  temperature >100.4    Complete by:  As directed      Diet - low sodium heart healthy    Complete by:  As directed      Discharge instructions    Complete by:  As directed   Hold Methotrexate until you complete antibiotics.     Increase activity slowly    Complete by:  As directed  Medication List    STOP taking these medications        methotrexate 2.5 MG tablet  Commonly known as:  RHEUMATREX      TAKE these medications        acetaminophen 650 MG CR tablet  Commonly known as:  TYLENOL  Take 1,300 mg by mouth every 8 (eight) hours as needed for pain.     albuterol 108 (90 Base) MCG/ACT inhaler  Commonly known as:  PROVENTIL HFA;VENTOLIN HFA  Inhale 1-2 puffs into the lungs every 6 (six) hours as needed for wheezing or shortness of breath.     aspirin 81 MG tablet  Take 81 mg by mouth daily.     atorvastatin 20 MG tablet  Commonly known as:  LIPITOR  Take 20 mg by mouth daily.     doxycycline 100 MG tablet  Commonly known as:  VIBRA-TABS  Take 1 tablet (100 mg total) by mouth every 12 (twelve) hours.     Fish Oil 500 MG Caps  Take 2 capsules by mouth 2 (two) times daily.     folic acid 1 MG tablet  Commonly known as:  FOLVITE  Take 1 mg by mouth daily.     lisinopril 40 MG tablet  Commonly known as:  PRINIVIL,ZESTRIL  Take 40 mg by mouth daily.     magnesium gluconate 500 MG tablet  Commonly  known as:  MAGONATE  Take 500 mg by mouth every evening.     metoprolol 100 MG tablet  Commonly known as:  LOPRESSOR  Take 0.5 tablets (50 mg total) by mouth 2 (two) times daily.     multivitamin with minerals Tabs tablet  Take 1 tablet by mouth daily.     nitroGLYCERIN 0.4 MG SL tablet  Commonly known as:  NITROSTAT  Place 1 tablet (0.4 mg total) under the tongue every 5 (five) minutes as needed for chest pain.     predniSONE 1 MG tablet  Commonly known as:  DELTASONE  Take 3 mg by mouth daily with breakfast.           Follow-up Information    Follow up with [facility].   Why:  Will call with an appt time for MRI in open scanner.   Contact information:   [employer]       Follow up with CORRINGTON,KIP A, MD. Schedule an appointment as soon as possible for a visit in 1 week.   Specialty:  Family Medicine   Why:  To review MRI results.   Contact information:   [facility] [address] [place] [address] [phone]        Time coordinating discharge: 35 minutes.  Signed:  Talibah Colasurdo  Pager [phone] [facility] 08/22/2015, 4:13 PM

## 2015-08-23 LAB — ROCKY MTN SPOTTED FVR ABS PNL(IGG+IGM)
RMSF IgG: POSITIVE — AB
RMSF IgM: 0.22 index (ref 0.00–0.89)

## 2015-08-23 LAB — RMSF, IGG, IFA: RMSF, IGG, IFA: <1:64 {titer}

## 2015-08-24 SURGERY — RADIOLOGY WITH ANESTHESIA
Anesthesia: General

## 2015-08-25 LAB — CULTURE, BLOOD (ROUTINE X 2)
CULTURE: NO GROWTH
CULTURE: NO GROWTH

## 2015-08-25 LAB — EHRLICHIA ANTIBODY PANEL
E CHAFFEENSIS AB, IGG: NEGATIVE
E CHAFFEENSIS AB, IGM: NEGATIVE
E. Chaffeensis (HME) IgM Titer: NEGATIVE
E.Chaffeensis (HME) IgG: NEGATIVE

## 2016-01-29 ENCOUNTER — Telehealth: Payer: Self-pay | Admitting: Cardiology

## 2016-01-29 NOTE — Telephone Encounter (Signed)
Returned call to patient's wife.She stated her husband wore a 48 hour holter monitor at [facility] in [place] back in May of this year.Stated he never received results of monitor.Stated he was at [place] last week to pick up medications.He was told last week the results of monitor.Stated monitor revealed atrial flutter.He was given a copy of monitor report.Stated her husband is not aware he has atrial flutter.Stated she would like appointment.Appointment scheduled with Kerin Ransom PA 01/30/16 at 2:00 pm.They will bring copy of monitor report.

## 2016-01-29 NOTE — Telephone Encounter (Signed)
New message  Prior ablation, Has a holter monitor  Reading shows irregular rhythm  Reading was done in May  Pt is asking if blood thinner needs to be prescribed

## 2016-01-30 ENCOUNTER — Encounter: Payer: Self-pay | Admitting: Cardiology

## 2016-01-30 ENCOUNTER — Ambulatory Visit (INDEPENDENT_AMBULATORY_CARE_PROVIDER_SITE_OTHER): Payer: Medicare Other | Admitting: Cardiology

## 2016-01-30 VITALS — BP 146/75 | HR 63 | Ht 71.0 in | Wt 227.8 lb

## 2016-01-30 DIAGNOSIS — I483 Typical atrial flutter: Secondary | ICD-10-CM | POA: Diagnosis not present

## 2016-01-30 DIAGNOSIS — I10 Essential (primary) hypertension: Secondary | ICD-10-CM | POA: Diagnosis not present

## 2016-01-30 DIAGNOSIS — I484 Atypical atrial flutter: Secondary | ICD-10-CM | POA: Diagnosis not present

## 2016-01-30 DIAGNOSIS — I714 Abdominal aortic aneurysm, without rupture, unspecified: Secondary | ICD-10-CM

## 2016-01-30 DIAGNOSIS — I2510 Atherosclerotic heart disease of native coronary artery without angina pectoris: Secondary | ICD-10-CM

## 2016-01-30 DIAGNOSIS — Z951 Presence of aortocoronary bypass graft: Secondary | ICD-10-CM

## 2016-01-30 DIAGNOSIS — I2583 Coronary atherosclerosis due to lipid rich plaque: Secondary | ICD-10-CM

## 2016-01-30 DIAGNOSIS — M353 Polymyalgia rheumatica: Secondary | ICD-10-CM

## 2016-01-30 NOTE — Patient Instructions (Addendum)
Medication Instructions:  Your physician recommends that you continue on your current medications as directed. Please refer to the Current Medication list given to you today.   Labwork: None ordered  Testing/Procedures: None ordered  Follow-Up: Follow up as planned with Dr.Jordan in Dec 2018  Any Other Special Instructions Will Be Listed Below (If Applicable).     If you need a refill on your cardiac medications before your next appointment, please call your pharmacy.

## 2016-01-30 NOTE — Assessment & Plan Note (Signed)
Chronic steroids 

## 2016-01-30 NOTE — Assessment & Plan Note (Signed)
3.2 cm by [place] June 2017

## 2016-01-30 NOTE — Assessment & Plan Note (Signed)
Recurrent A flutter- 2014 he converted spontaneously, recurrent A flutter Dec 2016- OP DCCV Jan 2017, recurrent flutter April 2017- s/p RFA.

## 2016-01-30 NOTE — Progress Notes (Signed)
01/30/2016 William [name]   September 13, 1950  [id number]  Primary Physician [name], MD Primary Cardiologist: Dr Martinique  HPI:   65 y.o. male with a history of CAD, s/p CABG in 03/1999 (LIMA-LAD, RIMA-OM2, SVG-OM1/OM3, SVG-AM of the RCA), HTN, HL, atrial flutter, PMR.  The pt had atrial flutter in July 2014 in setting of an acute illness that converted spontaneously to NSR. He was seen at the [facility] in November 2016 and was noted to have an irregular pulse. He was  in atrial flutter. He denied any symptoms. He was  started on Xarelto and his beta blocker was increased. Echo showed mild LVH with normal systolic function. EF 55-60%. Mild RAE.   He underwent DCCV in Jan 2017. On follow up in Feb. He was back in atrial flutter. He subsequently underwent ablation of atrial flutter by Dr. Curt Bears on 06/15/15. His anticoagulation was stopped one month later. When he saw Dr Martinique in May 2017 he was in NSR.  A Holter was done at the [place] later in May 2017 and apparently showed 8 hrs of atrial flutter. The pt didn't find this out till a recent OV at the [place]. He brought the report with him but not the actual strips showing the atrial flutter. He was admitted this past June with a fever and was in NSR then. He is in NSR today.    Current Outpatient Prescriptions  Medication Sig Dispense Refill  . acetaminophen (TYLENOL) 650 MG CR tablet Take 1,300 mg by mouth every 8 (eight) hours as needed for pain.    Marland Kitchen albuterol (PROVENTIL HFA;VENTOLIN HFA) 108 (90 BASE) MCG/ACT inhaler Inhale 1-2 puffs into the lungs every 6 (six) hours as needed for wheezing or shortness of breath. 1 Inhaler 0  . aspirin 81 MG tablet Take 81 mg by mouth daily.    Marland Kitchen atorvastatin (LIPITOR) 20 MG tablet Take 20 mg by mouth daily.    . folic acid (FOLVITE) 1 MG tablet Take 1 mg by mouth daily.    Marland Kitchen lisinopril (PRINIVIL,ZESTRIL) 40 MG tablet Take 40 mg by mouth daily.    . magnesium gluconate (MAGONATE) 500 MG tablet Take 500 mg by  mouth every evening.     . metoprolol (LOPRESSOR) 100 MG tablet Take 0.5 tablets (50 mg total) by mouth 2 (two) times daily. 60 tablet 0  . Multiple Vitamin (MULTIVITAMIN WITH MINERALS) TABS tablet Take 1 tablet by mouth daily.    . nitroGLYCERIN (NITROSTAT) 0.4 MG SL tablet Place 1 tablet (0.4 mg total) under the tongue every 5 (five) minutes as needed for chest pain. 25 tablet 11  . Omega-3 Fatty Acids (FISH OIL) 500 MG CAPS Take 2 capsules by mouth 2 (two) times daily.    . predniSONE (DELTASONE) 1 MG tablet Take 2 mg by mouth daily with breakfast.     No current facility-administered medications for this visit.     Allergies  Allergen Reactions  . Oxycodone Itching  . Iodine Itching  . Zocor [Simvastatin] Other (See Comments)    Pain    Social History   Social History  . Marital status: Married    Spouse name: N/A  . Number of children: N/A  . Years of education: N/A   Occupational History  .      Retired   Social History Main Topics  . Smoking status: Current Some Day Smoker    Types: Cigars  . Smokeless tobacco: Never Used  . Alcohol use 0.6 oz/week  1 Cans of beer per week     Comment: Occasional beer  . Drug use: No  . Sexual activity: Not on file   Other Topics Concern  . Not on file   Social History Narrative  . No narrative on file     Review of Systems: General: negative for chills, fever, night sweats or weight changes.  Cardiovascular: negative for chest pain, dyspnea on exertion, edema, orthopnea, palpitations, paroxysmal nocturnal dyspnea or shortness of breath Dermatological: negative for rash Respiratory: negative for cough or wheezing Urologic: negative for hematuria Abdominal: negative for nausea, vomiting, diarrhea, bright red blood per rectum, melena, or hematemesis Neurologic: negative for visual changes, syncope, or dizziness All other systems reviewed and are otherwise negative except as noted above.    Blood pressure (!) 146/75,  pulse 63, height 5\' 11"  (1.803 m), weight 227 lb 12.8 oz (103.3 kg).  General appearance: alert, cooperative and no distress Neck: no carotid bruit and no JVD Lungs: clear to auscultation bilaterally Heart: regular rate and rhythm Extremities: extremities normal, atraumatic, no cyanosis or edema Pulses: 2+ and symmetric Skin: Skin color, texture, turgor normal. No rashes or lesions Neurologic: Grossly normal  EKG NSR  ASSESSMENT AND PLAN:   Typical atrial flutter (HCC) Recurrent A flutter- 2014 he converted spontaneously, recurrent A flutter Dec 2016- OP DCCV Jan 2017, recurrent flutter April 2017- s/p RFA. Now with recurrent asymptomatic A flutter by Holter done at the [place] in May  Polymyalgia rheumatica [facility]) Chronic steroids  HTN (hypertension) Controlled-normal LVF and mild LVH Dec 2016  AAA (abdominal aortic aneurysm) without rupture (HCC) 3.2 cm by [place] June 2017   PLAN  I suggested Mr Corrie Dandy would need to go back on Xarelto (CHADs VASc=3) but he is reluctant to do that "all those TV commercials". I told him I would discuss it with Dr Martinique and get back to him.   Kerin Ransom PA-C 01/30/2016 2:48 PM

## 2016-01-30 NOTE — Assessment & Plan Note (Signed)
Controlled-normal LVF and mild LVH Dec 2016

## 2016-01-31 ENCOUNTER — Other Ambulatory Visit: Payer: Self-pay | Admitting: *Deleted

## 2016-01-31 ENCOUNTER — Telehealth: Payer: Self-pay | Admitting: Cardiology

## 2016-01-31 MED ORDER — RIVAROXABAN 20 MG PO TABS: 20.0000 mg | ORAL_TABLET | Freq: Every day | ORAL | 3 refills | Status: DC

## 2016-01-31 NOTE — Telephone Encounter (Signed)
Per discussion with Dr Martinique we have recommended he start Xarelto 20 mg. We'll provide samples and call in an Rx to [facility]. The pt will try and obtain the actual telemetry strips from the [place] that showed 8 hours of atrial Flutter.   Kerin Ransom PA-C 01/31/2016 4:45 PM

## 2016-02-02 ENCOUNTER — Telehealth: Payer: Self-pay | Admitting: Cardiology

## 2016-02-02 NOTE — Telephone Encounter (Signed)
Pt is starting on Xarelto today,should he continue to take his aspirin?

## 2016-02-05 NOTE — Telephone Encounter (Signed)
Returned call to patient 02/02/16.Advised to stop aspirin.

## 2016-02-10 NOTE — Progress Notes (Signed)
Cardiology Office Note    Date:  02/12/2016   ID:  [name], DOB 1950/03/16, MRN [id number]  PCP:  [name], MD  Cardiologist:  Dr. Peter Martinique     History of Present Illness: William [name] is a 65 y.o. male seen for follow up CAD and atrial flutter. He has a history of CAD, s/p CABG in 03/1999 (LIMA-LAD, RIMA-OM2, SVG-OM1/OM3, SVG-AM of the RCA), HTN, HL, atrial flutter, PMR.   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  He converted to NSR on his own and Eliquis was eventually d/c'd.  He was seen at the [facility] clinic in November 2016 and was noted to have an irregular pulse. He was  in atrial flutter. He denied any symptoms. He was  started on Xarelto. Metoprolol dose was increased for rate control. Echo showed mild LVH with normal systolic function. EF 55-60%. Mild RAE.   He underwent DCCV in Jan 2017. On follow up in Feb. He was back in atrial flutter. He subsequently underwent ablation of atrial flutter by Dr. Curt Bears on 06/15/15. No complications. His anticoagulation was stopped one month later. When he was seen  in May 2017 he was in NSR.  A Holter was done at the [place] later in May 2017 and apparently showed 8 paroxysmal atrial fibrillation and flutter. Burden 4%.  Mali vasc score of 3. It was recommended that he resume Xarelto.   On follow up today he reports he is doing well. No chest pain or SOB. He is limited by hip/back pain. He has had injections with brief relief. Now going to a chiropractor. Denies palpitations or dizziness.   Recent Labs: 08/21/2015: ALT 63; Creatinine, Ser 0.87; Hemoglobin 15.8; Potassium 4.8  Wt Readings from Last 3 Encounters:  02/12/16 228 lb 6.4 oz (103.6 kg)  01/30/16 227 lb 12.8 oz (103.3 kg)  08/20/15 225 lb 1.6 oz (102.1 kg)     Past Medical History:  Diagnosis Date  . AAA (abdominal aortic aneurysm) without rupture ([place]) 08/22/2015  . Abdominal mass, right lower quadrant, fatty 08/22/2015  .  Atrial flutter ([place]) 09/19/2012  . CMV (cytomegalovirus infection) ([place])   . Coronary disease 2001   CABG  . Cytomegalovirus mononucleosis 09/17/2012  . Hypercholesterolemia   . Hypertension   . Insomnia   . Myocardial infarction   . Pneumonia   . Polymyalgia rheumatica ([place])   . Tobacco abuse     Current Outpatient Prescriptions  Medication Sig Dispense Refill  . acetaminophen (TYLENOL) 650 MG CR tablet Take 1,300 mg by mouth every 8 (eight) hours as needed for pain.    Marland Kitchen albuterol (PROVENTIL HFA;VENTOLIN HFA) 108 (90 BASE) MCG/ACT inhaler Inhale 1-2 puffs into the lungs every 6 (six) hours as needed for wheezing or shortness of breath. 1 Inhaler 0  . atorvastatin (LIPITOR) 20 MG tablet Take 20 mg by mouth daily.    . folic acid (FOLVITE) 1 MG tablet Take 1 mg by mouth daily.    Marland Kitchen lisinopril (PRINIVIL,ZESTRIL) 40 MG tablet Take 40 mg by mouth daily.    . magnesium gluconate (MAGONATE) 500 MG tablet Take 500 mg by mouth every evening.     . metoprolol (LOPRESSOR) 100 MG tablet Take 0.5 tablets (50 mg total) by mouth 2 (two) times daily. 60 tablet 0  . Multiple Vitamin (MULTIVITAMIN WITH MINERALS) TABS tablet Take 1 tablet by mouth daily.    . nitroGLYCERIN (NITROSTAT) 0.4 MG SL tablet Place  1 tablet (0.4 mg total) under the tongue every 5 (five) minutes as needed for chest pain. 25 tablet 11  . Omega-3 Fatty Acids (FISH OIL) 500 MG CAPS Take 2 capsules by mouth 2 (two) times daily.    . predniSONE (DELTASONE) 1 MG tablet Take 2 mg by mouth daily with breakfast.    . rivaroxaban (XARELTO) 20 MG TABS tablet Take 1 tablet (20 mg total) by mouth daily with supper. 90 tablet 3   No current facility-administered medications for this visit.     Allergies:   Oxycodone; Iodine; and Zocor [simvastatin]   Social History:  The patient  reports that he has been smoking Cigars.  He has never used smokeless tobacco. He reports that he drinks about 0.6 oz of alcohol per week . He reports that he does  not use drugs.   Family History:  The patient's family history includes Bladder Cancer in his father; Colon cancer in his mother; Heart failure in his father; Pancreatic cancer in his mother.   ROS:  Please see the history of present illness.   All other systems reviewed and negative.   PHYSICAL EXAM: VS:  BP 140/86   Pulse 74   Ht 5\' 11"  (1.803 m)   Wt 228 lb 6.4 oz (103.6 kg)   BMI 31.86 kg/m  Well nourished, well developed, in no acute distress  HEENT: normal  Neck: no JVD  Vascular:  No carotid bruits bilat Cardiac:  normal S1, S2; IRRR; no murmur  Lungs:  clear to auscultation bilaterally, no wheezing, rhonchi or rales  Abd: soft, nontender, no hepatomegaly  Ext: no edema  Skin: warm and dry  Neuro:  CNs 2-12 intact, no focal abnormalities noted  EKG:  07/04/15 shows sinus brady with rate 52. Otherwise normal. I have personally reviewed and interpreted this study.  Lab Results  Component Value Date   WBC 3.7 (L) 08/21/2015   HGB 15.8 08/21/2015   HCT 47.6 08/21/2015   PLT 70 (L) 08/21/2015   GLUCOSE 98 08/21/2015   ALT 63 08/21/2015   AST 57 (H) 08/21/2015   NA 137 08/21/2015   K 4.8 08/21/2015   CL 106 08/21/2015   CREATININE 0.87 08/21/2015   BUN 12 08/21/2015   CO2 24 08/21/2015   TSH 0.951 09/22/2012   INR 2.03 (H) 03/23/2015      I personally reviewed event monitor tracings from the [place] as noted.   Myoview 08/08/15:Study Highlights    Nuclear stress EF: 54%. Visually, the LV function appears to be greater than the computer generated 54%. Normal LV functino  There was no ST segment deviation noted during stress.  The study is normal. there is no ischemia or infarction  This is a low risk study     ASSESSMENT AND PLAN:  1. CAD:  S/p CABG in 2001. Last myoview in June 2017.  Stable. No angina. 2. Atrial Flutter:  Recurrent. Now s/p atrial flutter ablation April 2017.  Recurrent paroxysmal flutter noted on monitor at [place]. We discussed that this may have  been too soon post ablation to see the full effect but he did also have atrial fibrillation which was not treated. He is asymptomatic. Now back on Xarelto and I would recommend he continue this indefinitely.  3. Hypertension:  Controlled. 4. Hyperlipidemia:  Continue statin.   5. Disposition:  will follow up in 6 months.   Signed, Peter Martinique MD, [facility]    02/12/2016 10:07 AM

## 2016-02-12 ENCOUNTER — Encounter: Payer: Self-pay | Admitting: Cardiology

## 2016-02-12 ENCOUNTER — Ambulatory Visit (INDEPENDENT_AMBULATORY_CARE_PROVIDER_SITE_OTHER): Payer: Medicare Other | Admitting: Cardiology

## 2016-02-12 VITALS — BP 140/86 | HR 74 | Ht 71.0 in | Wt 228.4 lb

## 2016-02-12 DIAGNOSIS — I2583 Coronary atherosclerosis due to lipid rich plaque: Secondary | ICD-10-CM | POA: Diagnosis not present

## 2016-02-12 DIAGNOSIS — I483 Typical atrial flutter: Secondary | ICD-10-CM

## 2016-02-12 DIAGNOSIS — E7800 Pure hypercholesterolemia, unspecified: Secondary | ICD-10-CM

## 2016-02-12 DIAGNOSIS — I10 Essential (primary) hypertension: Secondary | ICD-10-CM | POA: Diagnosis not present

## 2016-02-12 DIAGNOSIS — Z951 Presence of aortocoronary bypass graft: Secondary | ICD-10-CM

## 2016-02-12 DIAGNOSIS — I2510 Atherosclerotic heart disease of native coronary artery without angina pectoris: Secondary | ICD-10-CM

## 2016-02-12 NOTE — Patient Instructions (Signed)
Continue your current therapy  I will see you in 6 months.   

## 2016-05-03 ENCOUNTER — Other Ambulatory Visit: Payer: Self-pay | Admitting: Physician Assistant

## 2016-05-03 DIAGNOSIS — R5381 Other malaise: Secondary | ICD-10-CM

## 2016-05-15 ENCOUNTER — Other Ambulatory Visit: Payer: Self-pay | Admitting: Physician Assistant

## 2016-05-15 DIAGNOSIS — Z7952 Long term (current) use of systemic steroids: Secondary | ICD-10-CM

## 2016-05-17 ENCOUNTER — Ambulatory Visit
Admission: RE | Admit: 2016-05-17 | Discharge: 2016-05-17 | Disposition: A | Discharge status: Home / Self Care | Payer: Medicare Other | Source: Ambulatory Visit | Attending: Physician Assistant | Admitting: Physician Assistant

## 2016-05-17 DIAGNOSIS — Z7952 Long term (current) use of systemic steroids: Secondary | ICD-10-CM

## 2016-05-28 DIAGNOSIS — M51369 Other intervertebral disc degeneration, lumbar region without mention of lumbar back pain or lower extremity pain: Secondary | ICD-10-CM | POA: Insufficient documentation

## 2016-05-28 DIAGNOSIS — M48062 Spinal stenosis, lumbar region with neurogenic claudication: Secondary | ICD-10-CM | POA: Insufficient documentation

## 2016-05-28 DIAGNOSIS — M5136 Other intervertebral disc degeneration, lumbar region: Secondary | ICD-10-CM | POA: Insufficient documentation

## 2016-07-02 DIAGNOSIS — M533 Sacrococcygeal disorders, not elsewhere classified: Secondary | ICD-10-CM | POA: Insufficient documentation

## 2016-08-13 ENCOUNTER — Telehealth: Payer: Self-pay | Admitting: Cardiology

## 2016-08-13 NOTE — Telephone Encounter (Signed)
Message sent to pharmacy for advice. °

## 2016-08-13 NOTE — Telephone Encounter (Signed)
Reviewed chart.  Patient okay to hold Xarelto for 3 days prior to injection.  Letter sent to Dr. Leontine Locket

## 2016-08-13 NOTE — Telephone Encounter (Signed)
Request for surgical clearance:  1. What type of surgery is being performed?Steroid Epidural Injection  2. When is this surgery scheduled? 08-20-16   3. Are there any medications that need to be held prior to surgery and how long?Can he stop his Xarelto? If so,how long?   4. Name of physician performing surgery?Dr Lincoln Maxin   5. What is your office phone and fax number? [phone] and fax is [phone]

## 2016-08-19 NOTE — Progress Notes (Signed)
Cardiology Office Note    Date:  08/22/2016   ID:  William [name], DOB [birth year], MRN [id number]  PCP:  [name], MD  Cardiologist:  Dr. Peter Martinique     History of Present Illness: William [name] is a 66 y.o. male seen for follow up CAD and atrial flutter. He has a history of CAD, s/p CABG in 03/1999 (LIMA-LAD, RIMA-OM2, SVG-OM1/OM3, SVG-AM of the RCA), HTN, HL, atrial flutter, PMR.   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  He converted to NSR on his own and Eliquis was eventually d/c'd.  He was seen at the [facility] clinic in November 2016 and was noted to have an irregular pulse. He was  in atrial flutter. He denied any symptoms. He was  started on Xarelto. Metoprolol dose was increased for rate control. Echo showed mild LVH with normal systolic function. EF 55-60%. Mild RAE.   He underwent DCCV in Jan 2017. On follow up in Feb. He was back in atrial flutter. He subsequently underwent ablation of atrial flutter by Dr. Curt Bears on 06/15/15. No complications. His anticoagulation was stopped one month later. When he was seen  in May 2017 he was in NSR.  A Holter was done at the [place] later in May 2017 and apparently showed 8 paroxysmal atrial fibrillation and flutter. Burden 4%.  Mali vasc score of 3. It was recommended that he resume Xarelto.   On follow up today he reports he is doing well. No chest pain or SOB. He is still limited by hip/back pain. He has seen multiple specialist, therapist, and a chiropractor. He has had injections with brief relief. States he has a compressed nerve in his back. Denies palpitations or dizziness.   Recent Labs: No results found for requested labs within last 8760 hours.  Wt Readings from Last 3 Encounters:  08/22/16 237 lb (107.5 kg)  02/12/16 228 lb 6.4 oz (103.6 kg)  01/30/16 227 lb 12.8 oz (103.3 kg)     Past Medical History:  Diagnosis Date  . AAA (abdominal aortic aneurysm) without rupture ([place])  08/22/2015  . Abdominal mass, right lower quadrant, fatty 08/22/2015  . Atrial flutter ([place]) 09/19/2012  . CMV (cytomegalovirus infection) ([place])   . Coronary disease 2001   CABG  . Cytomegalovirus mononucleosis 09/17/2012  . Hypercholesterolemia   . Hypertension   . Insomnia   . Myocardial infarction ([place])   . Pneumonia   . Polymyalgia rheumatica ([place])   . Tobacco abuse     Current Outpatient Prescriptions  Medication Sig Dispense Refill  . acetaminophen (TYLENOL) 650 MG CR tablet Take 1,300 mg by mouth every 8 (eight) hours as needed for pain.    Marland Kitchen albuterol (PROVENTIL HFA;VENTOLIN HFA) 108 (90 BASE) MCG/ACT inhaler Inhale 1-2 puffs into the lungs every 6 (six) hours as needed for wheezing or shortness of breath. 1 Inhaler 0  . folic acid (FOLVITE) 1 MG tablet Take 1 mg by mouth daily.    Marland Kitchen lisinopril (PRINIVIL,ZESTRIL) 40 MG tablet Take 40 mg by mouth daily.    . magnesium gluconate (MAGONATE) 500 MG tablet Take 500 mg by mouth every evening.     . metoprolol (LOPRESSOR) 100 MG tablet Take 0.5 tablets (50 mg total) by mouth 2 (two) times daily. 60 tablet 0  . Multiple Vitamin (MULTIVITAMIN WITH MINERALS) TABS tablet Take 1 tablet by mouth daily.    . nitroGLYCERIN (NITROSTAT) 0.4 MG SL tablet Place 1  tablet (0.4 mg total) under the tongue every 5 (five) minutes as needed for chest pain. 25 tablet 11  . Omega-3 Fatty Acids (FISH OIL) 500 MG CAPS Take 2 capsules by mouth 2 (two) times daily.    . predniSONE (DELTASONE) 1 MG tablet Take 2 mg by mouth daily with breakfast.    . rivaroxaban (XARELTO) 20 MG TABS tablet Take 1 tablet (20 mg total) by mouth daily with supper. 90 tablet 3  . atorvastatin (LIPITOR) 80 MG tablet Take 1 tablet (80 mg total) by mouth daily. 90 tablet 3   No current facility-administered medications for this visit.     Allergies:   Oxycodone; Iodine; and Zocor [simvastatin]   Social History:  The patient  reports that he has been smoking Cigars.  He has never  used smokeless tobacco. He reports that he drinks about 0.6 oz of alcohol per week . He reports that he does not use drugs.   Family History:  The patient's family history includes Bladder Cancer in his father; Colon cancer in his mother; Heart failure in his father; Pancreatic cancer in his mother.   ROS:  Please see the history of present illness.   All other systems reviewed and negative.   PHYSICAL EXAM: VS:  BP 134/86   Pulse 60   Ht 5\' 11"  (1.803 m)   Wt 237 lb (107.5 kg)   BMI 33.05 kg/m  Well nourished, obese, in no acute distress  HEENT: normal  Neck: no JVD  Vascular:  No carotid bruits bilat Cardiac:  normal S1, S2; RRR; no murmur  Lungs:  clear to auscultation bilaterally, no wheezing, rhonchi or rales  Abd: soft, nontender, no hepatomegaly  Ext: no edema  Skin: warm and dry  Neuro:  CNs 2-12 intact, no focal abnormalities noted   Lab Results  Component Value Date   WBC 3.7 (L) 08/21/2015   HGB 15.8 08/21/2015   HCT 47.6 08/21/2015   PLT 70 (L) 08/21/2015   GLUCOSE 98 08/21/2015   ALT 63 08/21/2015   AST 57 (H) 08/21/2015   NA 137 08/21/2015   K 4.8 08/21/2015   CL 106 08/21/2015   CREATININE 0.87 08/21/2015   BUN 12 08/21/2015   CO2 24 08/21/2015   TSH 0.951 09/22/2012   INR 2.03 (H) 03/23/2015   Dated 02/16/16: Normal CMET, glucose 106. Cholesterol 187, triglycerides 85, HDL 47, LDL 123.   Myoview 08/08/15:Study Highlights    Nuclear stress EF: 54%. Visually, the LV function appears to be greater than the computer generated 54%. Normal LV functino  There was no ST segment deviation noted during stress.  The study is normal. there is no ischemia or infarction  This is a low risk study     ASSESSMENT AND PLAN:  1. CAD:  S/p CABG in 2001. Last myoview in June 2017.  Stable. No angina. 2. Atrial Flutter:  Recurrent. Now s/p atrial flutter ablation April 2017.  Recurrent paroxysmal flutter noted on monitor at [place]. He is asymptomatic. On Xarelto and I  would recommend he continue this indefinitely.  3. Hypertension:  Controlled. 4. Hyperlipidemia:  LDL 123 which is not close to goal. Recommend increasing lipitor to 80 mg daily. Will get repeat lab work at the [place] in 3 months. 5. Disposition:  will follow up in 6 months.   Signed, Peter Martinique MD, [facility]    08/22/2016 9:24 AM

## 2016-08-22 ENCOUNTER — Ambulatory Visit (INDEPENDENT_AMBULATORY_CARE_PROVIDER_SITE_OTHER): Payer: Medicare Other | Admitting: Cardiology

## 2016-08-22 ENCOUNTER — Encounter: Payer: Self-pay | Admitting: Cardiology

## 2016-08-22 VITALS — BP 134/86 | HR 60 | Ht 71.0 in | Wt 237.0 lb

## 2016-08-22 DIAGNOSIS — I2510 Atherosclerotic heart disease of native coronary artery without angina pectoris: Secondary | ICD-10-CM

## 2016-08-22 DIAGNOSIS — I10 Essential (primary) hypertension: Secondary | ICD-10-CM | POA: Diagnosis not present

## 2016-08-22 DIAGNOSIS — I483 Typical atrial flutter: Secondary | ICD-10-CM | POA: Diagnosis not present

## 2016-08-22 DIAGNOSIS — Z951 Presence of aortocoronary bypass graft: Secondary | ICD-10-CM

## 2016-08-22 DIAGNOSIS — E7800 Pure hypercholesterolemia, unspecified: Secondary | ICD-10-CM | POA: Diagnosis not present

## 2016-08-22 MED ORDER — ATORVASTATIN CALCIUM 80 MG PO TABS: 80.0000 mg | ORAL_TABLET | Freq: Every day | ORAL | 3 refills | Status: DC

## 2016-08-22 NOTE — Patient Instructions (Signed)
Increase lipitor to 80 mg daily  Continue your other therapy

## 2016-09-18 ENCOUNTER — Telehealth: Payer: Self-pay | Admitting: Cardiology

## 2016-09-18 NOTE — Telephone Encounter (Signed)
Returned call to patient Dr.Jordan's recommendations given. 

## 2016-09-18 NOTE — Telephone Encounter (Signed)
Pt of Dr. Martinique  Patient calling regarding any contraindications for use of inversion table. Hx HTN, Atrial flutter, AAA. Routed to provider for review.

## 2016-09-18 NOTE — Telephone Encounter (Signed)
Inversion therapy can lead to significant elevation of BP and is generally not recommended for patients with HTN, CV disease, or glaucoma.  Peter Martinique MD, [facility]

## 2016-09-18 NOTE — Telephone Encounter (Signed)
New message      Pt has a back problem. Calling to ask if Dr Martinique thinks it will be ok to use an inversion table.  If so, pt will buy one to have at home.  Please call

## 2016-10-15 NOTE — Progress Notes (Signed)
Cardiology Office Note    Date:  10/17/2016   ID:  William [name], DOB Jul 24, 1950, MRN [id number]  PCP:  William Rim, MD  Cardiologist:  Dr. Ayliana Casciano [name]     History of Present Illness: William [name] is a 66 y.o. male seen for follow up CAD and atrial flutter. He needs cardiac clearance for back surgery. He has a history of CAD, s/p CABG in 03/1999 (LIMA-LAD, RIMA-OM2, SVG-OM1/OM3, SVG-AM of the RCA), HTN, HL, atrial flutter, PMR.   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  He converted to NSR on his own and Eliquis was eventually d/c'd.  He was seen at the [facility] clinic in November 2016 and was noted to have an irregular pulse. He was  in atrial flutter. He denied any symptoms. He was  started on Xarelto. Metoprolol dose was increased for rate control. Echo showed mild LVH with normal systolic function. EF 55-60%. Mild RAE.   He underwent DCCV in Jan 2017. On follow up in Feb. He was back in atrial flutter. He subsequently underwent ablation of atrial flutter by Dr. Curt Bears on 06/15/15. No complications. His anticoagulation was stopped one month later. When he was seen  in May 2017 he was in NSR.  A Holter was done at the [place] later in May 2017 and apparently showed  paroxysmal atrial fibrillation and flutter. Burden 4%.  Mali vasc score of 3. It was recommended that he resume Xarelto.   On follow up today he reports he is doing well. No chest pain or SOB. He is still limited by hip/back pain. He has seen multiple specialist, therapist, and a chiropractor. He has had injections with brief relief. States he has a compressed nerve in his back. Surgery recommended. Denies palpitations or dizziness.   Recent Labs: No results found for requested labs within last 8760 hours.  Wt Readings from Last 3 Encounters:  10/17/16 237 lb 6.4 oz (107.7 kg)  08/22/16 237 lb (107.5 kg)  02/12/16 228 lb 6.4 oz (103.6 kg)     Past Medical History:  Diagnosis  Date  . AAA (abdominal aortic aneurysm) without rupture (William [name]) 08/22/2015  . Abdominal mass, right lower quadrant, fatty 08/22/2015  . Atrial flutter (William [name]) 09/19/2012  . CMV (cytomegalovirus infection) (William [name])   . Coronary disease 2001   CABG  . Cytomegalovirus mononucleosis 09/17/2012  . Hypercholesterolemia   . Hypertension   . Insomnia   . Myocardial infarction (William [name])   . Pneumonia   . Polymyalgia rheumatica (William [name])   . Tobacco abuse     Current Outpatient Prescriptions  Medication Sig Dispense Refill  . acetaminophen (TYLENOL) 650 MG CR tablet Take 1,300 mg by mouth every 8 (eight) hours as needed for pain.    William [name] albuterol (PROVENTIL HFA;VENTOLIN HFA) 108 (90 BASE) MCG/ACT inhaler Inhale 1-2 puffs into the lungs every 6 (six) hours as needed for wheezing or shortness of breath. 1 Inhaler 0  . atorvastatin (LIPITOR) 80 MG tablet Take 1 tablet (80 mg total) by mouth daily. 90 tablet 3  . folic acid (FOLVITE) 1 MG tablet Take 1 mg by mouth daily.    William [name] lisinopril (PRINIVIL,ZESTRIL) 40 MG tablet Take 40 mg by mouth daily.    . magnesium gluconate (MAGONATE) 500 MG tablet Take 500 mg by mouth every evening.     . metoprolol (LOPRESSOR) 100 MG tablet Take 0.5 tablets (50 mg total) by mouth 2 (two) times daily. 60 tablet  0  . Multiple Vitamin (MULTIVITAMIN WITH MINERALS) TABS tablet Take 1 tablet by mouth daily.    . nitroGLYCERIN (NITROSTAT) 0.4 MG SL tablet Place 1 tablet (0.4 mg total) under the tongue every 5 (five) minutes as needed for chest pain. 25 tablet 11  . Omega-3 Fatty Acids (FISH OIL) 500 MG CAPS Take 2 capsules by mouth 2 (two) times daily.    . predniSONE (DELTASONE) 1 MG tablet Take 2 mg by mouth daily with breakfast.    . rivaroxaban (XARELTO) 20 MG TABS tablet Take 1 tablet (20 mg total) by mouth daily with supper. 90 tablet 3   No current facility-administered medications for this visit.     Allergies:   Oxycodone; Iodine; and Zocor [simvastatin]   Social History:  The  patient  reports that he has been smoking Cigars.  He has never used smokeless tobacco. He reports that he drinks about 0.6 oz of alcohol per week . He reports that he does not use drugs.   Family History:  The patient's family history includes Bladder Cancer in his father; Colon cancer in his mother; Heart failure in his father; Pancreatic cancer in his mother.   ROS:  Please see the history of present illness.   All other systems reviewed and negative.   PHYSICAL EXAM: VS:  BP 137/77   Pulse 73   Ht 5\' 11"  (1.803 m)   Wt 237 lb 6.4 oz (107.7 kg)   SpO2 90%   BMI 33.11 kg/m  Well nourished, obese, in no acute distress  HEENT: normal  Neck: no JVD  Vascular:  No carotid bruits bilat Cardiac:  normal S1, S2; RRR; no murmur  Lungs:  clear to auscultation bilaterally, no wheezing, rhonchi or rales  Abd: soft, nontender, no hepatomegaly  Ext: no edema  Skin: warm and dry  Neuro:  CNs 2-12 intact, no focal abnormalities noted   Lab Results  Component Value Date   WBC 3.7 (L) 08/21/2015   HGB 15.8 08/21/2015   HCT 47.6 08/21/2015   PLT 70 (L) 08/21/2015   GLUCOSE 98 08/21/2015   ALT 63 08/21/2015   AST 57 (H) 08/21/2015   NA 137 08/21/2015   K 4.8 08/21/2015   CL 106 08/21/2015   CREATININE 0.87 08/21/2015   BUN 12 08/21/2015   CO2 24 08/21/2015   TSH 0.951 09/22/2012   INR 2.03 (H) 03/23/2015   Dated 02/16/16: Normal CMET, glucose 106. Cholesterol 187, triglycerides 85, HDL 47, LDL 123.   Myoview 08/08/15:Study Highlights    Nuclear stress EF: 54%. Visually, the LV function appears to be greater than the computer generated 54%. Normal LV functino  There was no ST segment deviation noted during stress.  The study is normal. there is no ischemia or infarction  This is a low risk study     ASSESSMENT AND PLAN:  1. CAD:  S/p CABG in 2001. Last myoview in June 2017.  Stable. No angina. 2. Atrial Flutter:  Recurrent. Now s/p atrial flutter ablation April 2017.   Recurrent paroxysmal flutter noted on monitor at [place]. He is asymptomatic. On Xarelto and I would recommend he continue this indefinitely.  3. Hypertension:  Controlled. 4. Hyperlipidemia:  LDL 123 which is not close to goal. Lipitor increased to 80 mg daily in June. Will follow up at the [place] 5. Spine surgery- patient cleared from a cardiac standpoint. May stop Xarelto for 2 days prior to surgery. Resume when OK from a surgical standpoint post op.  Signed, Bela Bonaparte Martinique MD, [facility]    10/17/2016 1:54 PM

## 2016-10-17 ENCOUNTER — Encounter: Payer: Self-pay | Admitting: Cardiology

## 2016-10-17 ENCOUNTER — Telehealth: Payer: Self-pay

## 2016-10-17 ENCOUNTER — Ambulatory Visit (INDEPENDENT_AMBULATORY_CARE_PROVIDER_SITE_OTHER): Payer: Medicare Other | Admitting: Cardiology

## 2016-10-17 VITALS — BP 137/77 | HR 73 | Ht 71.0 in | Wt 237.4 lb

## 2016-10-17 DIAGNOSIS — I25729 Atherosclerosis of autologous artery coronary artery bypass graft(s) with unspecified angina pectoris: Secondary | ICD-10-CM | POA: Insufficient documentation

## 2016-10-17 DIAGNOSIS — I10 Essential (primary) hypertension: Secondary | ICD-10-CM | POA: Diagnosis not present

## 2016-10-17 DIAGNOSIS — I483 Typical atrial flutter: Secondary | ICD-10-CM

## 2016-10-17 DIAGNOSIS — Z951 Presence of aortocoronary bypass graft: Secondary | ICD-10-CM | POA: Diagnosis not present

## 2016-10-17 DIAGNOSIS — I2510 Atherosclerotic heart disease of native coronary artery without angina pectoris: Secondary | ICD-10-CM | POA: Diagnosis not present

## 2016-10-17 NOTE — Telephone Encounter (Signed)
Dr.Jordan's 10/17/16 office note faxed to Dr.George Ghobrial attention [place] fax # [phone].

## 2016-10-17 NOTE — Patient Instructions (Signed)
Continue your current therapy  Hold Xarelto at least 48 hours prior to surgery

## 2016-10-25 ENCOUNTER — Telehealth: Payer: Self-pay

## 2016-10-25 NOTE — Telephone Encounter (Signed)
Dr.Jordan advised ok to hold Xarelto 48 hours prior to surgery.Faxed to [facility] fax # [phone].  Dr.Globrial requested to hold Xarelto 5 days prior to surgery.Dr.Jordan advised no need but ok.Note faxed back fax # [phone].

## 2016-11-11 DIAGNOSIS — M4150 Other secondary scoliosis, site unspecified: Secondary | ICD-10-CM | POA: Insufficient documentation

## 2016-11-11 DIAGNOSIS — M4316 Spondylolisthesis, lumbar region: Secondary | ICD-10-CM | POA: Insufficient documentation

## 2016-11-18 ENCOUNTER — Telehealth: Payer: Self-pay | Admitting: Cardiology

## 2016-11-18 NOTE — Telephone Encounter (Signed)
Returned call to patient's wife.She stated husband was discharged from [name] yesterday from back surgery.Stated he took last heparin shot yesterday.She wanted to know when he should restart Xarelto.Advsied I will speak to our pharmacist and call her back.

## 2016-11-18 NOTE — Telephone Encounter (Signed)
New message    Pt wife is calling about pt. She said pt was discharged from the hospital last night and they want to know when they are to restart the xarelto. She said he was in the hospital for 7 days and received heparin shots there

## 2016-11-18 NOTE — Telephone Encounter (Signed)
Returned call to patient's wife.Left message on personal voice mail,I spoke to our pharmacist Raquel she advised to call surgeon and make sure ok to restart Xarelto this afternoon.

## 2016-12-31 ENCOUNTER — Other Ambulatory Visit: Payer: Self-pay | Admitting: Cardiology

## 2017-01-15 DIAGNOSIS — Z981 Arthrodesis status: Secondary | ICD-10-CM | POA: Insufficient documentation

## 2017-02-11 ENCOUNTER — Ambulatory Visit: Payer: Medicare Other | Admitting: Cardiology

## 2017-03-03 DIAGNOSIS — B259 Cytomegaloviral disease, unspecified: Secondary | ICD-10-CM | POA: Insufficient documentation

## 2017-03-03 DIAGNOSIS — E7800 Pure hypercholesterolemia, unspecified: Secondary | ICD-10-CM | POA: Insufficient documentation

## 2017-03-03 DIAGNOSIS — I219 Acute myocardial infarction, unspecified: Secondary | ICD-10-CM | POA: Insufficient documentation

## 2017-03-03 DIAGNOSIS — Z720 Tobacco use: Secondary | ICD-10-CM | POA: Insufficient documentation

## 2017-03-03 DIAGNOSIS — G4700 Insomnia, unspecified: Secondary | ICD-10-CM | POA: Insufficient documentation

## 2017-03-24 NOTE — Progress Notes (Signed)
Cardiology Office Note    Date:  03/28/2017   ID:  William [name], DOB [birth year], MRN [id number]  PCP:  [name], MD  Cardiologist:  Dr. [name]     History of Present Illness: William [name] is a 67 y.o. male seen for follow up CAD and atrial flutter. He needs cardiac clearance for back surgery. He has a history of CAD, s/p CABG in 03/1999 (LIMA-LAD, RIMA-OM2, SVG-OM1/OM3, SVG-AM of the RCA), HTN, HL, atrial flutter, PMR.   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  He converted to NSR on his own and Eliquis was eventually d/c'd.  He was seen at the [facility] in November 2016 and was noted to have an irregular pulse. He was  in atrial flutter. He denied any symptoms. He was  started on Xarelto. Metoprolol dose was increased for rate control. Echo showed mild LVH with normal systolic function. EF 55-60%. Mild RAE.   He underwent DCCV in Jan 2017. On follow up in Feb. He was back in atrial flutter. He subsequently underwent ablation of atrial flutter by Dr. Curt Bears on 06/15/15. No complications. His anticoagulation was stopped one month later. When he was seen  in May 2017 he was in NSR.  A Holter was done at the [place] later in May 2017 and apparently showed  paroxysmal atrial fibrillation and flutter. Burden 4%.  Mali vasc score of 3. It was recommended that he resume Xarelto.   On follow up today he reports he is doing well. He did undergo back surgery- lumbar fusion in September. This was uncomplicated. Still has a lot of back pain. Getting active PT. Denies palpitations or dizziness. No chest pain or SOB. He has gained 9 lbs.  Recent Labs: No results found for requested labs within last 8760 hours.  Wt Readings from Last 3 Encounters:  03/28/17 246 lb 3.2 oz (111.7 kg)  10/17/16 237 lb 6.4 oz (107.7 kg)  08/22/16 237 lb (107.5 kg)     Past Medical History:  Diagnosis Date  . AAA (abdominal aortic aneurysm) without rupture ([place])  08/22/2015  . Abdominal mass, right lower quadrant, fatty 08/22/2015  . Atrial flutter ([place]) 09/19/2012  . CMV (cytomegalovirus infection) ([place])   . Coronary disease 2001   CABG  . Cytomegalovirus mononucleosis 09/17/2012  . Hypercholesterolemia   . Hypertension   . Insomnia   . Myocardial infarction ([place])   . Pneumonia   . Polymyalgia rheumatica ([place])   . Tobacco abuse     Current Outpatient Medications  Medication Sig Dispense Refill  . acetaminophen (TYLENOL) 650 MG CR tablet Take 1,300 mg by mouth every 8 (eight) hours as needed for pain.    Marland Kitchen albuterol (PROVENTIL HFA;VENTOLIN HFA) 108 (90 BASE) MCG/ACT inhaler Inhale 1-2 puffs into the lungs every 6 (six) hours as needed for wheezing or shortness of breath. 1 Inhaler 0  . atorvastatin (LIPITOR) 80 MG tablet Take 1 tablet (80 mg total) by mouth daily. 90 tablet 3  . folic acid (FOLVITE) 1 MG tablet Take 1 mg by mouth daily.    Marland Kitchen lisinopril (PRINIVIL,ZESTRIL) 40 MG tablet Take 40 mg by mouth daily.    . magnesium gluconate (MAGONATE) 500 MG tablet Take 500 mg by mouth every evening.     . metoprolol (LOPRESSOR) 100 MG tablet Take 0.5 tablets (50 mg total) by mouth 2 (two) times daily. 60 tablet 0  . Multiple Vitamin (MULTIVITAMIN WITH MINERALS)  TABS tablet Take 1 tablet by mouth daily.    . nitroGLYCERIN (NITROSTAT) 0.4 MG SL tablet Place 1 tablet (0.4 mg total) under the tongue every 5 (five) minutes as needed for chest pain. 25 tablet 11  . Omega-3 Fatty Acids (FISH OIL) 500 MG CAPS Take 2 capsules by mouth 2 (two) times daily.    . predniSONE (DELTASONE) 1 MG tablet Take 2 mg by mouth daily with breakfast.    . XARELTO 20 MG TABS tablet TAKE 1 TABLET DAILY WITH SUPPER 90 tablet 1   No current facility-administered medications for this visit.     Allergies:   Oxycodone; Iodine; and Zocor [simvastatin]   Social History:  The patient  reports that he has been smoking cigars.  he has never used smokeless tobacco. He reports that he  drinks about 0.6 oz of alcohol per week. He reports that he does not use drugs.   Family History:  The patient's family history includes Bladder Cancer in his father; Colon cancer in his mother; Heart failure in his father; Pancreatic cancer in his mother.   ROS:  Please see the history of present illness.   All other systems reviewed and negative.   PHYSICAL EXAM: VS:  BP (!) 138/92 (BP Location: Left Arm, Patient Position: Sitting, Cuff Size: Large)   Pulse 80   Ht 5\' 11"  (1.803 m)   Wt 246 lb 3.2 oz (111.7 kg)   BMI 34.34 kg/m  GENERAL:  Well appearing obese WM in NAD HEENT:  PERRL, EOMI, sclera are clear. Oropharynx is clear. NECK:  No jugular venous distention, carotid upstroke brisk and symmetric, no bruits, no thyromegaly or adenopathy LUNGS:  Clear to auscultation bilaterally CHEST:  Unremarkable HEART:  RRR,  PMI not displaced or sustained,S1 and S2 within normal limits, no S3, no S4: no clicks, no rubs, no murmurs ABD:  Soft, nontender. BS +, no masses or bruits. No hepatomegaly, no splenomegaly EXT:  2 + pulses throughout, no edema, no cyanosis no clubbing SKIN:  Warm and dry.  No rashes NEURO:  Alert and oriented x 3. Cranial nerves II through XII intact. PSYCH:  Cognitively intact     Lab Results  Component Value Date   WBC 3.7 (L) 08/21/2015   HGB 15.8 08/21/2015   HCT 47.6 08/21/2015   PLT 70 (L) 08/21/2015   GLUCOSE 98 08/21/2015   ALT 63 08/21/2015   AST 57 (H) 08/21/2015   NA 137 08/21/2015   K 4.8 08/21/2015   CL 106 08/21/2015   CREATININE 0.87 08/21/2015   BUN 12 08/21/2015   CO2 24 08/21/2015   TSH 0.951 09/22/2012   INR 2.03 (H) 03/23/2015   Dated 02/16/16: Normal CMET, glucose 106. Cholesterol 187, triglycerides 85, HDL 47, LDL 123.  Dated 07/25/16: Normal Hgb, creatinine, ALT.  Dated 02/07/17: WBC 9.9K, otherwise CBC normal. A1c 5.6%. CMET normal. Cholesterol 145, triglycerides 188, HDL 45, LDL 62.   Ecg today shows NSR with first degree AV  block. Otherwise normal. I have personally reviewed and interpreted this study.  Myoview 08/08/15:Study Highlights    Nuclear stress EF: 54%. Visually, the LV function appears to be greater than the computer generated 54%. Normal LV functino  There was no ST segment deviation noted during stress.  The study is normal. there is no ischemia or infarction  This is a low risk study     ASSESSMENT AND PLAN:  1. CAD:  S/p CABG in 2001. Last myoview in June 2017.  He is asymptomatic. Continue Rx.   2. Atrial Flutter:  Recurrent. Now s/p atrial flutter ablation April 2017.  Recurrent paroxysmal flutter noted on monitor at [place]. He is asymptomatic. On Xarelto. He is in NSR today. 3. Hypertension:  Controlled. 4. Hyperlipidemia:  LDL has dropped from 123 to 60 with increase in lipitor dose. Continue. 5. S/p lumbar fusion.   Signed, Marchello Rothgeb Martinique MD, [facility]    03/28/2017 11:38 AM

## 2017-03-28 ENCOUNTER — Ambulatory Visit (INDEPENDENT_AMBULATORY_CARE_PROVIDER_SITE_OTHER): Payer: Medicare Other | Admitting: Cardiology

## 2017-03-28 ENCOUNTER — Encounter: Payer: Self-pay | Admitting: Cardiology

## 2017-03-28 VITALS — BP 138/92 | HR 80 | Ht 71.0 in | Wt 246.2 lb

## 2017-03-28 DIAGNOSIS — Z951 Presence of aortocoronary bypass graft: Secondary | ICD-10-CM | POA: Diagnosis not present

## 2017-03-28 DIAGNOSIS — I2510 Atherosclerotic heart disease of native coronary artery without angina pectoris: Secondary | ICD-10-CM

## 2017-03-28 DIAGNOSIS — E7800 Pure hypercholesterolemia, unspecified: Secondary | ICD-10-CM

## 2017-03-28 DIAGNOSIS — I10 Essential (primary) hypertension: Secondary | ICD-10-CM | POA: Diagnosis not present

## 2017-03-28 DIAGNOSIS — I484 Atypical atrial flutter: Secondary | ICD-10-CM | POA: Diagnosis not present

## 2017-03-28 NOTE — Patient Instructions (Addendum)
Continue your current therapy  Work on getting your weight down  I will see you in 6 months

## 2017-06-08 IMAGING — DX DG CHEST 2V
2 series · 2 of 2 positions shown · non-contrast
Comparison: 03/23/2015

CLINICAL DATA: Fever of 102, chills.

EXAM:
CHEST  2 VIEW

[w chest pa]
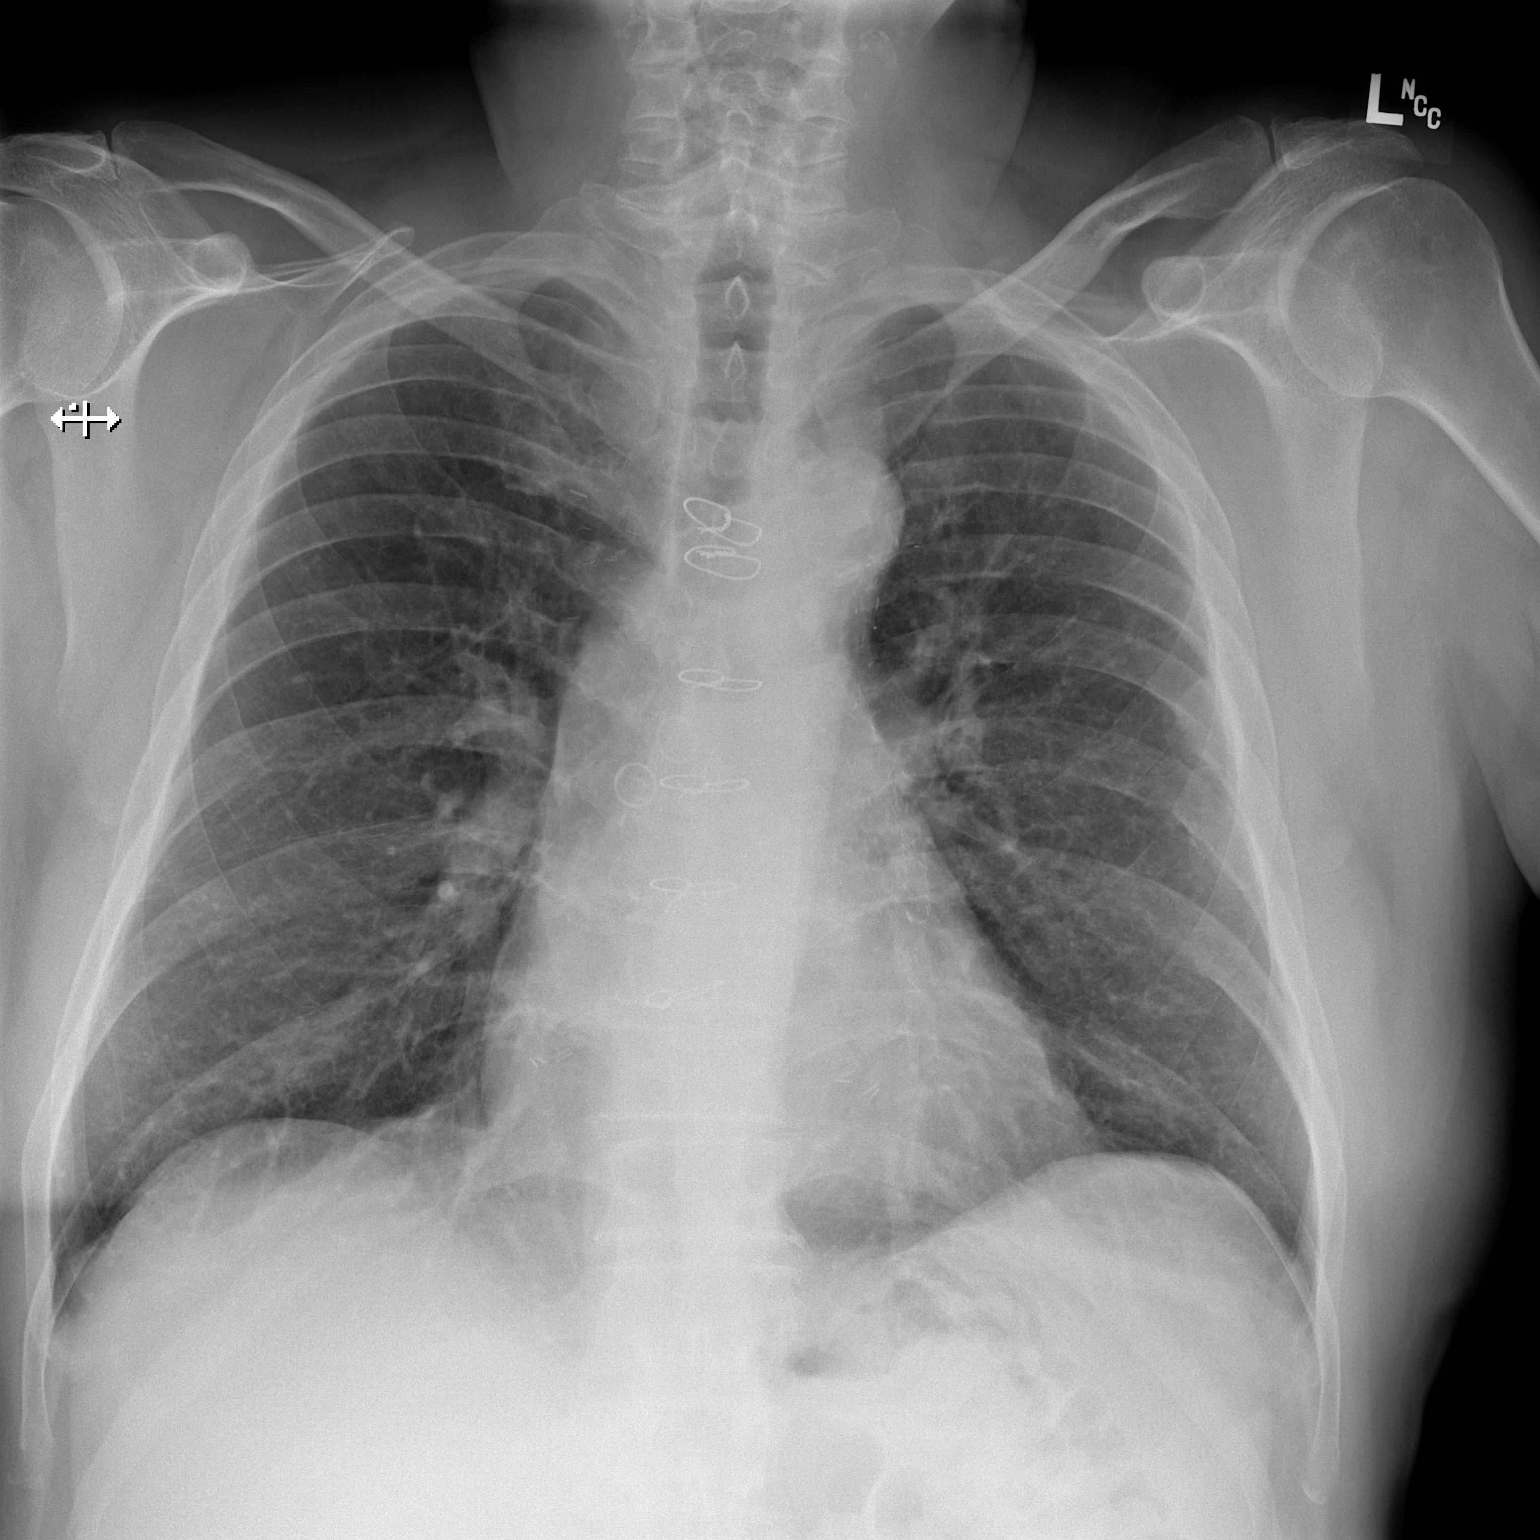

[w chest lat]
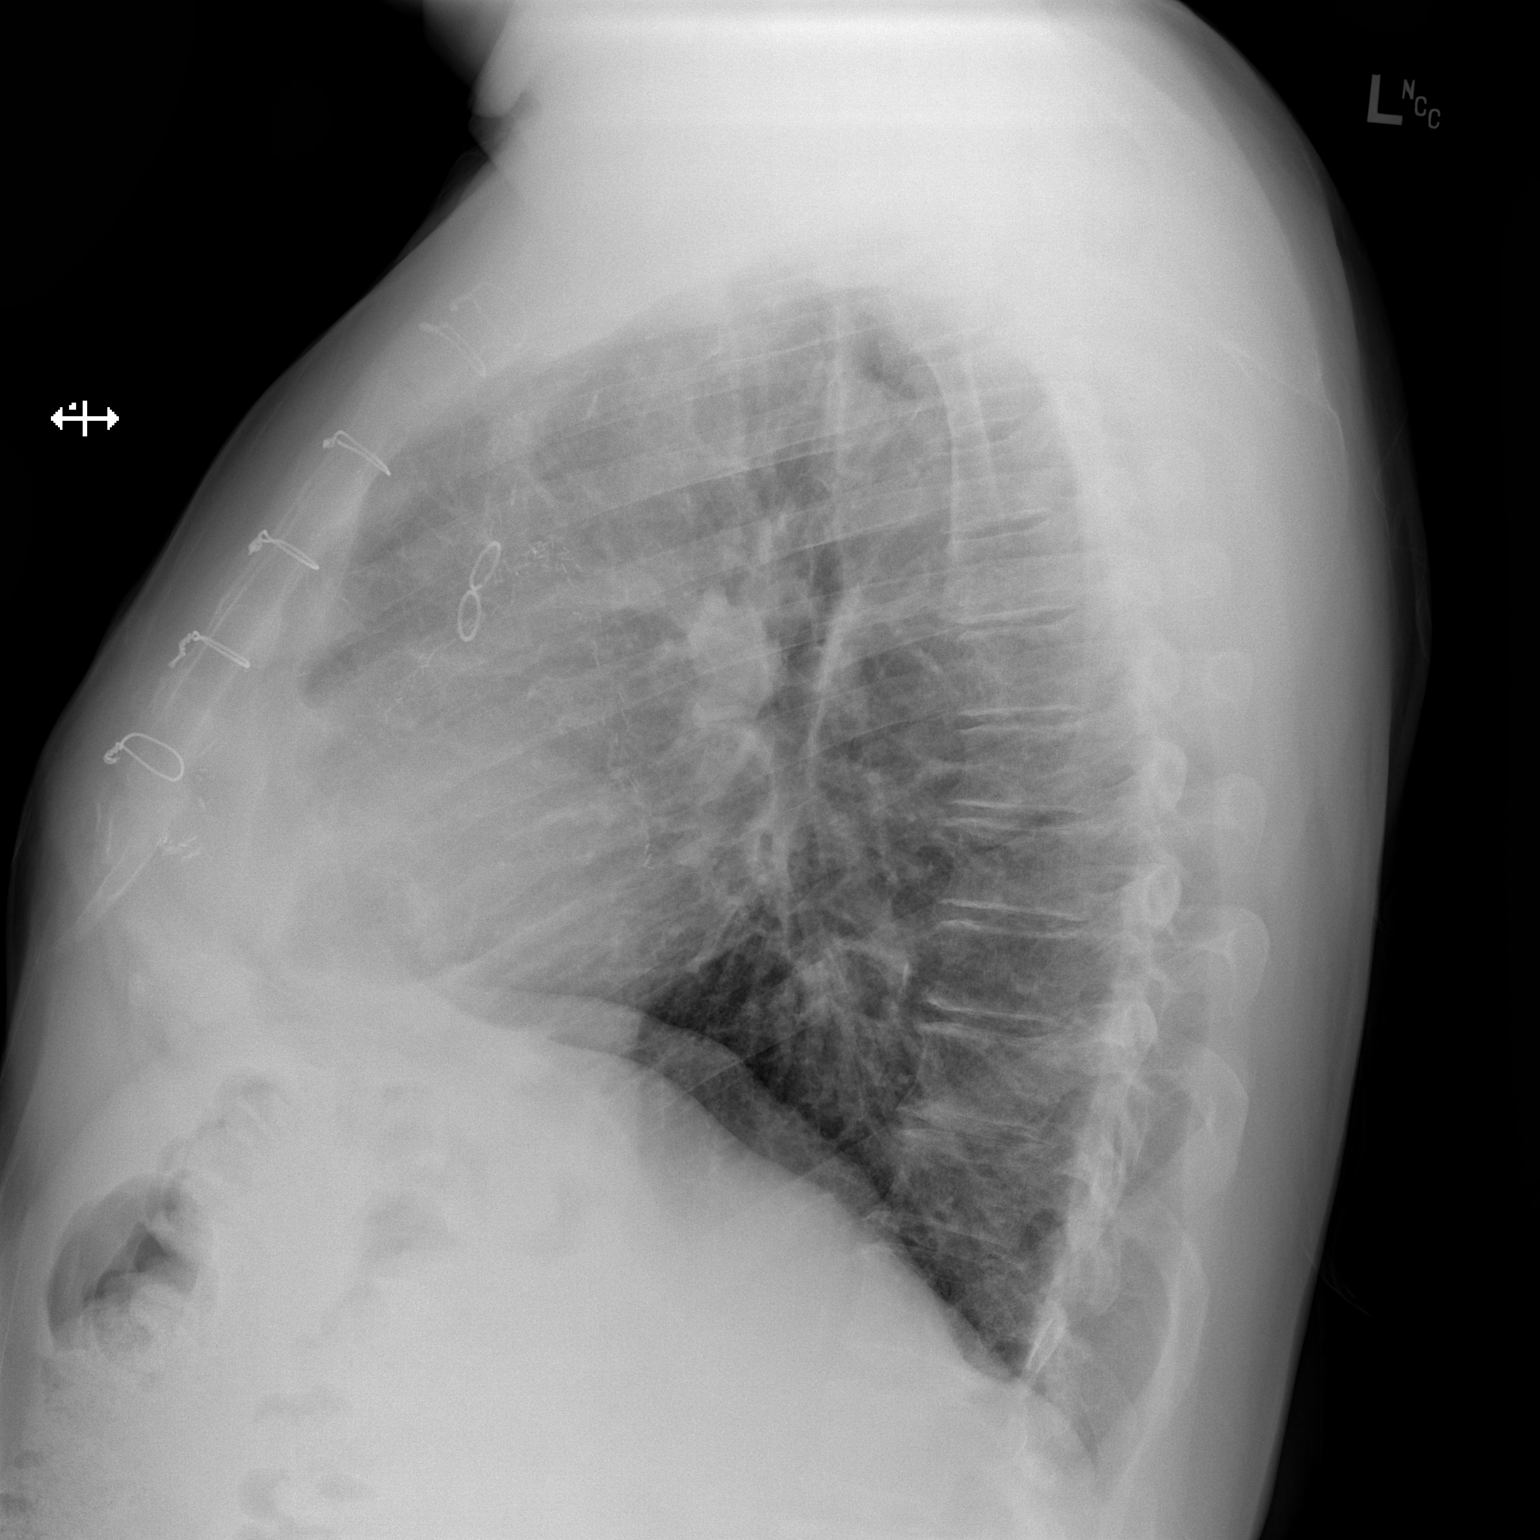

[2 of 2 positions shown; findings below may reference images not displayed]

FINDINGS: Postsurgical changes from CABG, stable.

Cardiomediastinal silhouette is normal. Mediastinal contours appear
intact.

There is no evidence of focal airspace consolidation, pleural
effusion or pneumothorax.

Osseous structures are without acute abnormality. Soft tissues are
grossly normal.
IMPRESSION: No active cardiopulmonary disease.

## 2017-06-08 IMAGING — CT CT RENAL STONE PROTOCOL
2 of 4 series · 16 of 46 positions shown, 18 images · non-contrast
Comparison: CT abdomen pelvis 09/18/2012.

CLINICAL DATA: Patient with low back pain, fever and chills for 3
days.

EXAM:
CT ABDOMEN AND PELVIS WITHOUT CONTRAST
TECHNIQUE: Multidetector CT imaging of the abdomen and pelvis was performed
following the standard protocol without IV contrast.

[Series 2: renal stone 5mm · axial · 0.98mm/px · z∈[+740,+1170]mm · 13 of 94 slices shown, 15 images]
[im 4/94  soft-tissue]
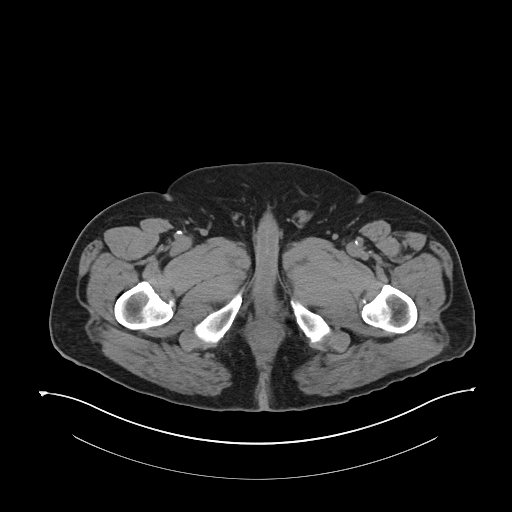
[im 4/94  bone]
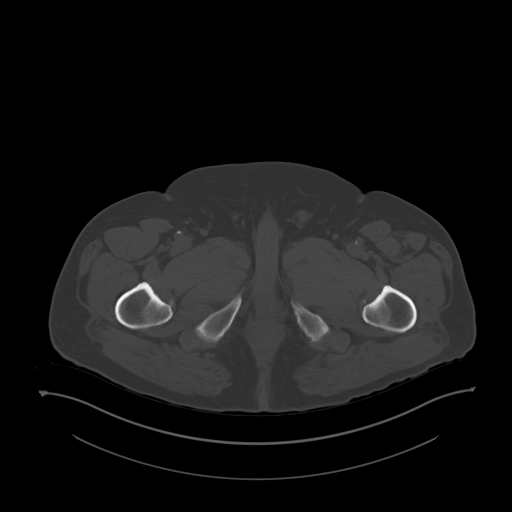
[im 12/94  soft-tissue]
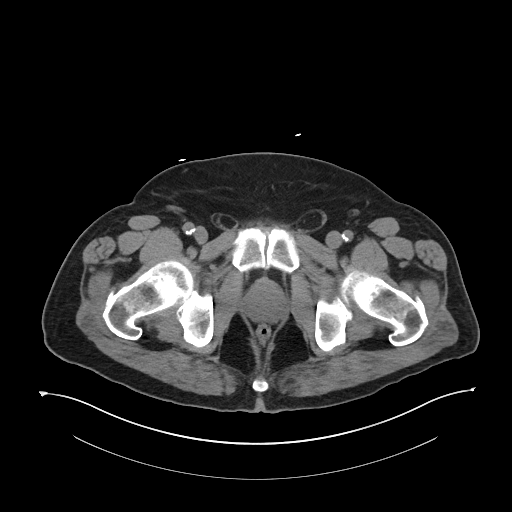
[im 20/94  soft-tissue]
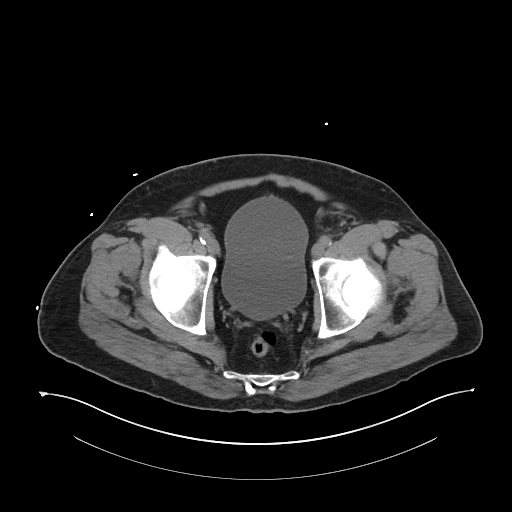
[im 28/94  soft-tissue]
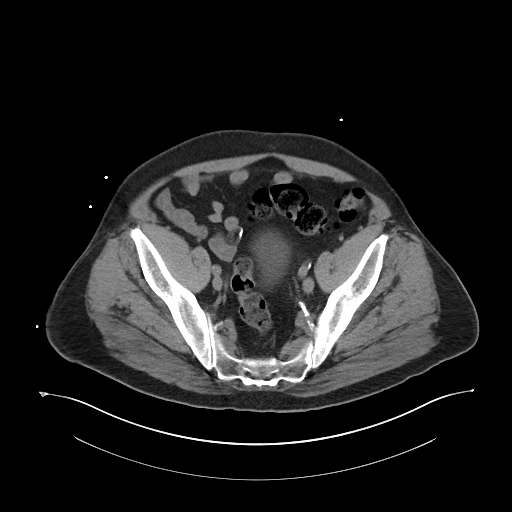
[im 32/94  soft-tissue]
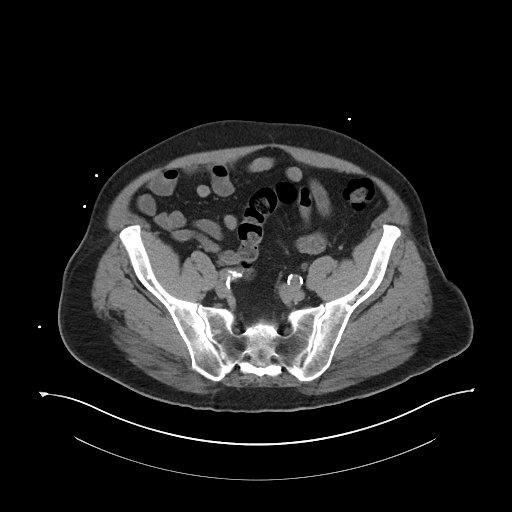
[im 39/94  soft-tissue]
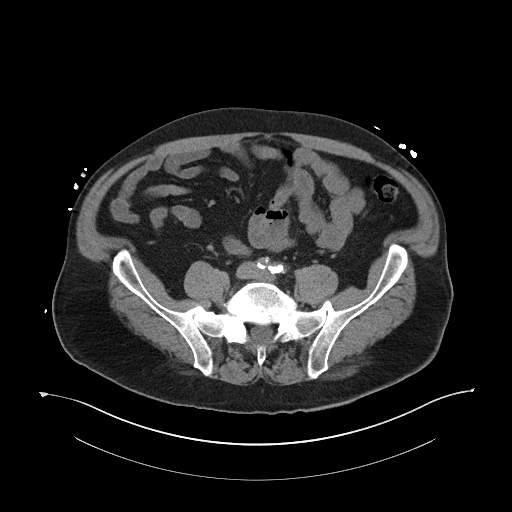
[im 47/94  soft-tissue]
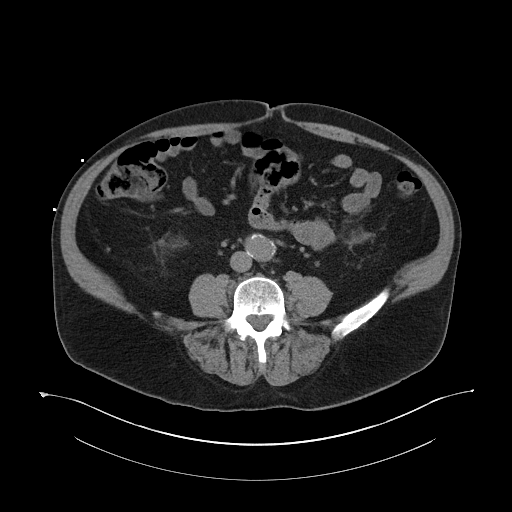
[im 55/94  soft-tissue]
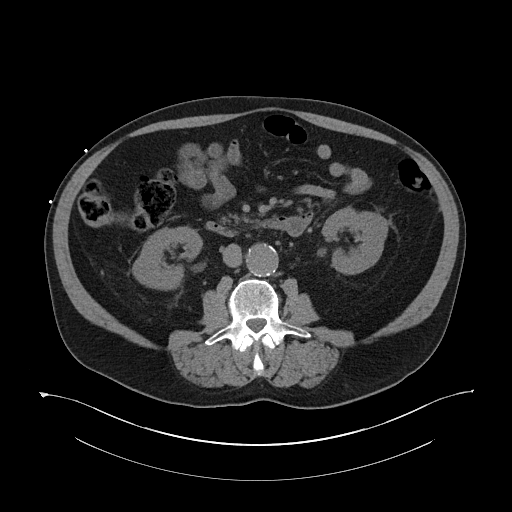
[im 63/94  soft-tissue]
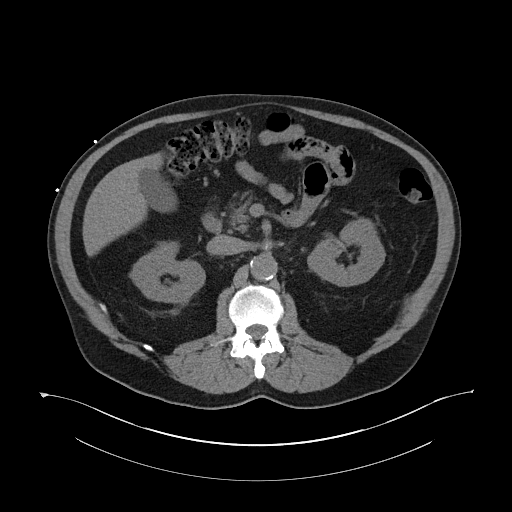
[im 63/94  bone]
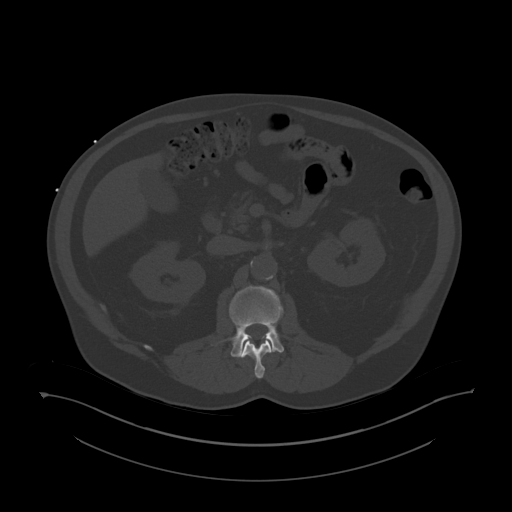
[im 66/94  soft-tissue]
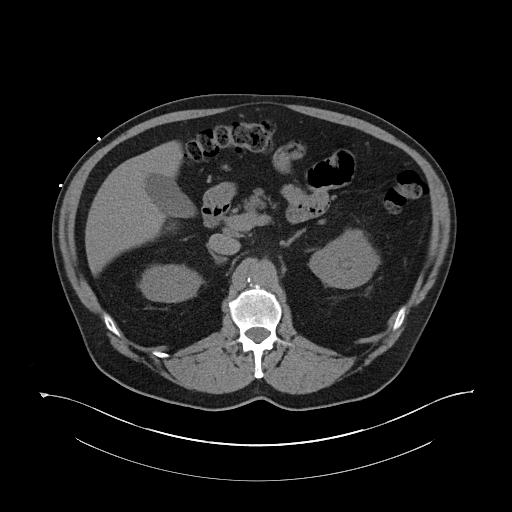
[im 74/94  soft-tissue]
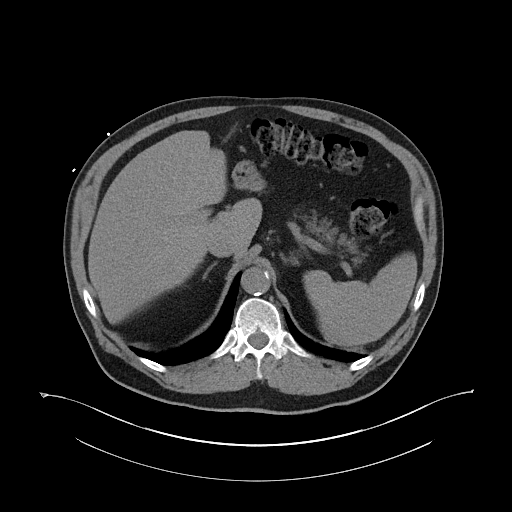
[im 82/94  soft-tissue]
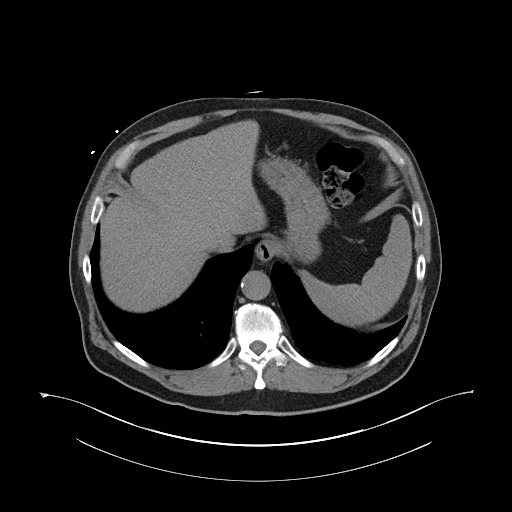
[im 90/94  soft-tissue]
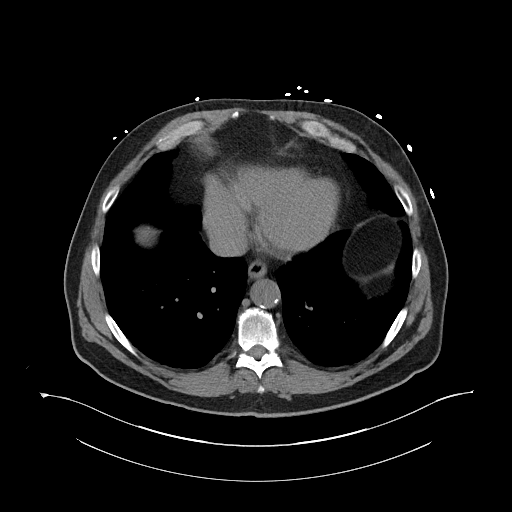

[Series 7: renal stone 3.0 cor · coronal · 0.85mm/px · 3 of 108 slices shown]
[im 36/108  soft-tissue]
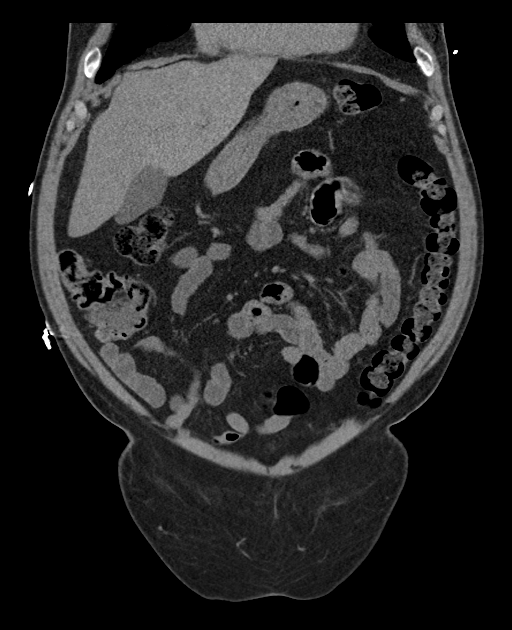
[im 48/108  soft-tissue]
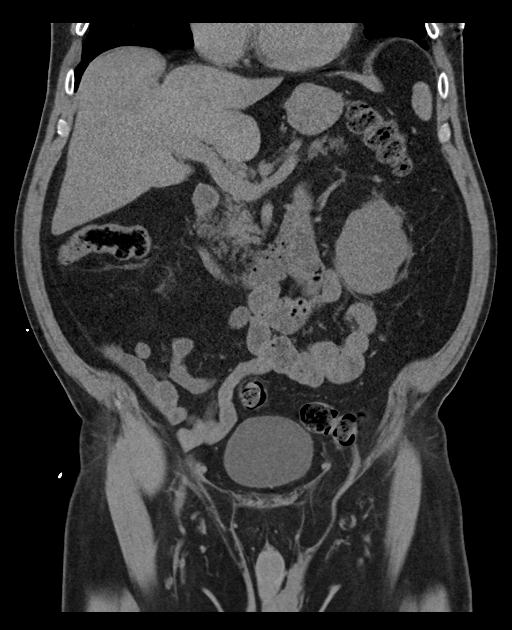
[im 60/108  soft-tissue]
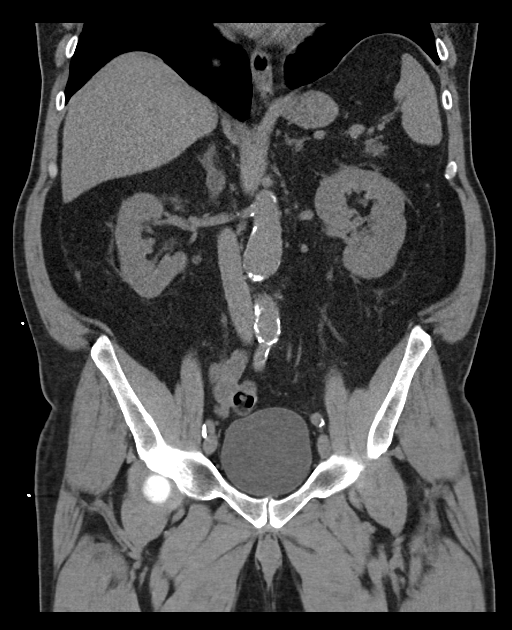

[16 of 46 positions shown; findings below may reference images not displayed]

FINDINGS: Lower chest: Normal heart size. Dependent atelectasis within the
bilateral lower lobes. No pleural effusion.

Hepatobiliary: Liver is normal in size and contour. Liver is
diffusely low in attenuation. Gallbladder is unremarkable.

Pancreas: Mild fatty atrophy.

Spleen: Unremarkable

Adrenals/Urinary Tract: Grossly unchanged 1.1 cm right adrenal
adenoma. Left adrenal gland is normal. Stable bilateral perinephric
fat stranding. No nephroureterolithiasis. No hydronephrosis. Urinary
bladder is unremarkable.

Stomach/Bowel: No abnormal bowel wall thickening are evidence for
bowel obstruction. No free fluid or free intraperitoneal air. Small
hiatal hernia. Normal morphology stomach.

Vascular/Lymphatic: Aneurysmal dilatation of the abdominal aorta
measuring 3.2 cm. Peripheral calcified noncalcified atherosclerotic
plaque. No retroperitoneal lymphadenopathy.

Other: Left-greater-than-right fat containing inguinal hernias.
Within the right lower quadrant there is a new 1.6 cm fatty
appearing mass with peripheral high attenuation (image 67; series
2), new from prior.

Musculoskeletal: Lumbar spine degenerative changes. No aggressive or
acute appearing osseous lesions.
IMPRESSION: No acute process within the abdomen or pelvis.

No nephroureterolithiasis.  No hydronephrosis.

Hepatic steatosis.

Infrarenal abdominal aortic aneurysm measuring 3.2 cm. Recommend
followup by ultrasound in 3 years. This recommendation follows ACR
consensus guidelines: White Paper of the ACR Incidental Findings

There is a new small predominately fatty mass within the right lower
quadrant which is nonspecific in etiology however may be
postsurgical given apparent interval appendectomy. Consider
follow-up CT in 6 months to assess for stability.

## 2017-06-29 ENCOUNTER — Other Ambulatory Visit: Payer: Self-pay | Admitting: Cardiology

## 2017-07-30 ENCOUNTER — Other Ambulatory Visit: Payer: Self-pay | Admitting: Cardiology

## 2017-09-15 DIAGNOSIS — M5441 Lumbago with sciatica, right side: Secondary | ICD-10-CM

## 2017-09-15 DIAGNOSIS — G8929 Other chronic pain: Secondary | ICD-10-CM | POA: Insufficient documentation

## 2017-09-15 DIAGNOSIS — M5442 Lumbago with sciatica, left side: Secondary | ICD-10-CM

## 2017-11-26 DIAGNOSIS — G609 Hereditary and idiopathic neuropathy, unspecified: Secondary | ICD-10-CM | POA: Insufficient documentation

## 2017-12-17 ENCOUNTER — Telehealth: Payer: Self-pay

## 2017-12-17 NOTE — Telephone Encounter (Signed)
   [facility] Pre-operative Risk Assessment    Request for surgical clearance:  1. What type of surgery is being performed? Spinal Cord Stimulator Trial   2. When is this surgery scheduled? 01/12/18   3. What type of clearance is required (medical clearance vs. Pharmacy clearance to hold med vs. Both)? Pharmacy  4. Are there any medications that need to be held prior to surgery and how long? Xarelto-hold 3 days prior and 5 days during trial   5. Practice name and name of physician performing surgery?  [facility]- Dr Lincoln Maxin   6. What is your office phone number [phone]    7.   What is your office fax number [phone]  8.   Anesthesia type (None, local, MAC, general) ? Unknown   Meryl Crutch 12/17/2017, 2:05 PM  _________________________________________________________________   (provider comments below)

## 2017-12-18 NOTE — Telephone Encounter (Signed)
Patient with diagnosis of atrial fibrillation on Xarelto for anticoagulation.    Procedure: spinal cord stimulator trial Date of procedure: 01/12/18  CHADS2-VASc score of  3 (, HTN, AGE, , CAD, )  CrCl 151 Platelet count 280  Per office protocol, patient can hold Xarelto for 3 days prior to procedure as well as 5 days during trial.    Patient should restart Xarelto as soon as possible, at discretion of procedure MD

## 2017-12-19 NOTE — Telephone Encounter (Signed)
Clearance to hold Xarelto has been faxed to requesting MD.

## 2017-12-23 ENCOUNTER — Telehealth: Payer: Self-pay | Admitting: Cardiology

## 2017-12-23 NOTE — Telephone Encounter (Signed)
Follow Up:     William [name] is calling to check on the status of pt's clearance that was sent on 12-17-17. Please fax asap to [phone] [id number].

## 2017-12-24 NOTE — Telephone Encounter (Signed)
Looks like William [name] has faxed it last week. Will try to fax again.

## 2017-12-26 ENCOUNTER — Other Ambulatory Visit: Payer: Self-pay | Admitting: Cardiology

## 2017-12-29 ENCOUNTER — Telehealth: Payer: Self-pay | Admitting: Cardiology

## 2017-12-29 NOTE — Telephone Encounter (Signed)
New message    3rd call to have notes faxed over to them , they have not received them.      [facility] Pre-operative Risk Assessment    Request for surgical clearance:  1. What type of surgery is being performed? Spinal cord stimulator   2. When is this surgery scheduled? 01/12/18  3. What type of clearance is required (medical clearance vs. Pharmacy clearance to hold med vs. Both)? Pharmacy   4. Are there any medications that need to be held prior to surgery and how long? Xarelto  - hold 3 prior and 5 days after   5. Practice name and name of physician performing surgery? Dr Leontine Locket  6. What is your office phone number [phone]    7.   What is your office fax number [phone]  8.   Anesthesia type (None, local, MAC, general) ? Iv sedation    William [name] 12/29/2017, 11:06 AM  _________________________________________________________________   (provider comments below)

## 2017-12-29 NOTE — Telephone Encounter (Signed)
This clearance has already been addressed in 12/17/17 phone note. Please see previous clearance.

## 2017-12-30 NOTE — Telephone Encounter (Signed)
From prior clearance  Alvstad, Kristin L, RPH-CPP     Patient with diagnosis of atrial fibrillation on Xarelto for anticoagulation.    Procedure: spinal cord stimulator trial Date of procedure: 01/12/18  CHADS2-VASc score of  3 (, HTN, AGE, , CAD, )  CrCl 151 Platelet count 280  Per office protocol, patient can hold Xarelto for 3 days prior to procedure as well as 5 days during trial.    Patient should restart Xarelto as soon as possible, at discretion of procedure MD      I will route this recommendation to the requesting party via [place] fax function and remove from pre-op pool.  Please call with questions.  [place], [place] 12/30/2017, 2:14 PM

## 2018-01-02 ENCOUNTER — Telehealth: Payer: Self-pay | Admitting: Cardiology

## 2018-01-02 NOTE — Telephone Encounter (Signed)
New Message    William [name] is calling from [facility]. She is stating that she has not received pre-op clearance fax for patient. They have moved to a new office so she would like for it to be faxed to a new number. The new fax number is [phone]

## 2018-01-02 NOTE — Telephone Encounter (Signed)
Sent the clearance to the number given, per Epic.

## 2018-01-08 NOTE — Telephone Encounter (Signed)
  William [name] at [facility] is requesting we refax surgical clearance form again to [phone]

## 2018-01-08 NOTE — Telephone Encounter (Signed)
Please handle. This seems to keep coming up in various phone notes and you already sent in one message - please call them to verify receipt and number.  Dayna Dunn PA-C

## 2018-01-08 NOTE — Telephone Encounter (Signed)
Can you please take care of this? Thanks. Does not look like any role for APP involvement as already addressed in earlier phone note. Kimoni Pickerill PA-C

## 2018-01-08 NOTE — Telephone Encounter (Addendum)
° ° °  Please see last 3 telephone encounters.    PLEASE FAX CLEARANCE LETTER AGAIN FAX [phone]

## 2018-01-08 NOTE — Telephone Encounter (Signed)
Re-faxed over surgical clearance to [phone]

## 2018-01-08 NOTE — Telephone Encounter (Signed)
  Can you please refax the clearance to [phone]. They did not receive it.

## 2018-01-09 ENCOUNTER — Telehealth: Payer: Self-pay

## 2018-01-09 NOTE — Telephone Encounter (Signed)
Confirmed fax has been received.

## 2018-01-09 NOTE — Telephone Encounter (Signed)
Received call from Jocelyn Lamer with [facility] office.She stated they never received surgical clearance.Advised ok for patient to hold Xarelto 3 days before procedure.Clearance re faxed to her at fax # [phone].

## 2018-01-09 NOTE — Telephone Encounter (Signed)
Called [facility] and refaxed clearance over and made sure it was received.

## 2018-01-09 NOTE — Telephone Encounter (Signed)
Follow up   Patient is calling to check on clearance information. He states that he has not heard from [facility]. Please advise.

## 2018-01-09 NOTE — Telephone Encounter (Signed)
Forwarding to callback staff to handle. Note this is the patient that has 4 clearance notes open for the same issue. Ellen Henri sent info on 10/18 and Vin re-reviewed on 10/29.  Dayna Dunn PA-C

## 2018-03-08 ENCOUNTER — Encounter: Payer: Self-pay | Admitting: Gastroenterology

## 2018-03-27 ENCOUNTER — Other Ambulatory Visit: Payer: Self-pay | Admitting: Cardiology

## 2018-03-27 NOTE — Telephone Encounter (Signed)
Pt is a 68 yr old male who saw Dr. Martinique on 03/28/17, has upcoming appt on 05/04/18. Last weight was 111.7Kg. SCr on 11/13/17 was 0.72.CrCl is 173mL/min. Will refill Xarelto 20mg  QD until March appt.

## 2018-04-16 ENCOUNTER — Encounter: Payer: Self-pay | Admitting: Cardiology

## 2018-04-30 NOTE — Progress Notes (Signed)
Cardiology Office Note    Date:  05/04/2018   ID:  William [name], DOB 11/29/1950, MRN [id number]  PCP:  [name], MD  Cardiologist:  Dr. Kevin Mario Martinique     History of Present Illness: William [name] is a 68 y.o. male seen for follow up CAD and atrial flutter. He has a history of CAD, s/p CABG in 03/1999 (LIMA-LAD, RIMA-OM2, SVG-OM1/OM3, SVG-AM of the RCA), HTN, HL, atrial flutter, PMR.   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  He converted to NSR on his own and Eliquis was eventually d/c'd.  He was seen at the [facility] clinic in November 2016 and was noted to have an irregular pulse. He was  in atrial flutter. He denied any symptoms. He was  started on Xarelto. Metoprolol dose was increased for rate control. Echo showed mild LVH with normal systolic function. EF 55-60%. Mild RAE.   He underwent DCCV in Jan 2017. On follow up in Feb. He was back in atrial flutter. He subsequently underwent ablation of atrial flutter by Dr. Curt Bears on 06/15/15. No complications. His anticoagulation was stopped one month later. When he was seen  in May 2017 he was in NSR.  A Holter was done at the [place] later in May 2017 and apparently showed  paroxysmal atrial fibrillation and flutter. Burden 4%.  Mali vasc score of 3. It was recommended that he resume Xarelto.   On follow up today he reports he is doing well. He did undergo back surgery- lumbar fusion in September. He has had persistent limiting back pain and had a spinal stimulator placed in December. This still hasn't relieved his pain. This has led to inactivity and weight gain. gained 10 lbs this year. Denies palpitations or dizziness. No chest pain or SOB.   Recent Labs: No results found for requested labs within last 8760 hours.  Wt Readings from Last 3 Encounters:  05/04/18 256 lb 12.8 oz (116.5 kg)  03/28/17 246 lb 3.2 oz (111.7 kg)  10/17/16 237 lb 6.4 oz (107.7 kg)     Past Medical History:  Diagnosis  Date  . AAA (abdominal aortic aneurysm) without rupture ([place]) 08/22/2015  . Abdominal mass, right lower quadrant, fatty 08/22/2015  . Atrial flutter ([place]) 09/19/2012  . CMV (cytomegalovirus infection) ([place])   . Coronary disease 2001   CABG  . Cytomegalovirus mononucleosis 09/17/2012  . Hypercholesterolemia   . Hypertension   . Insomnia   . Myocardial infarction ([place])   . Pneumonia   . Polymyalgia rheumatica ([place])   . Tobacco abuse     Current Outpatient Medications  Medication Sig Dispense Refill  . acetaminophen (TYLENOL) 650 MG CR tablet Take 1,300 mg by mouth every 8 (eight) hours as needed for pain.    Marland Kitchen albuterol (PROVENTIL HFA;VENTOLIN HFA) 108 (90 BASE) MCG/ACT inhaler Inhale 1-2 puffs into the lungs every 6 (six) hours as needed for wheezing or shortness of breath. 1 Inhaler 0  . atorvastatin (LIPITOR) 80 MG tablet Take 1 tablet (80 mg total) by mouth daily. 90 tablet 3  . folic acid (FOLVITE) 1 MG tablet Take 1 mg by mouth daily.    Marland Kitchen lisinopril (PRINIVIL,ZESTRIL) 40 MG tablet Take 40 mg by mouth daily.    . magnesium gluconate (MAGONATE) 500 MG tablet Take 500 mg by mouth every evening.     . metoprolol (LOPRESSOR) 100 MG tablet Take 0.5 tablets (50 mg total) by mouth 2 (two)  times daily. 60 tablet 0  . Multiple Vitamin (MULTIVITAMIN WITH MINERALS) TABS tablet Take 1 tablet by mouth daily.    . Omega-3 Fatty Acids (FISH OIL) 500 MG CAPS Take 2 capsules by mouth 2 (two) times daily.    . predniSONE (DELTASONE) 1 MG tablet Take 2 mg by mouth daily with breakfast.    . rivaroxaban (XARELTO) 20 MG TABS tablet Take 1 tablet (20 mg total) by mouth daily with supper. 90 tablet 0  . nitroGLYCERIN (NITROSTAT) 0.4 MG SL tablet Place 1 tablet (0.4 mg total) under the tongue every 5 (five) minutes as needed for chest pain. (Patient not taking: Reported on 05/04/2018) 25 tablet 11   No current facility-administered medications for this visit.     Allergies:   Oxycodone; Iodine; and Zocor  [simvastatin]   Social History:  The patient  reports that he has been smoking cigars. He has never used smokeless tobacco. He reports current alcohol use of about 1.0 standard drinks of alcohol per week. He reports that he does not use drugs.   Family History:  The patient's family history includes Bladder Cancer in his father; Colon cancer in his mother; Heart failure in his father; Pancreatic cancer in his mother.   ROS:  Please see the history of present illness.   All other systems reviewed and negative.   PHYSICAL EXAM: VS:  BP 120/86   Pulse 66   Ht 5\' 11"  (1.803 m)   Wt 256 lb 12.8 oz (116.5 kg)   BMI 35.82 kg/m  GENERAL:  Well appearing obese WM in NAD HEENT:  PERRL, EOMI, sclera are clear. Oropharynx is clear. NECK:  No jugular venous distention, carotid upstroke brisk and symmetric, no bruits, no thyromegaly or adenopathy LUNGS:  Clear to auscultation bilaterally CHEST:  Unremarkable HEART:  RRR,  PMI not displaced or sustained,S1 and S2 within normal limits, no S3, no S4: no clicks, no rubs, no murmurs ABD:  Soft, nontender. BS +, no masses or bruits. No hepatomegaly, no splenomegaly EXT:  2 + pulses throughout, no edema, no cyanosis no clubbing SKIN:  Warm and dry.  No rashes NEURO:  Alert and oriented x 3. Cranial nerves II through XII intact. PSYCH:  Cognitively intact       Lab Results  Component Value Date   WBC 3.7 (L) 08/21/2015   HGB 15.8 08/21/2015   HCT 47.6 08/21/2015   PLT 70 (L) 08/21/2015   GLUCOSE 98 08/21/2015   ALT 63 08/21/2015   AST 57 (H) 08/21/2015   NA 137 08/21/2015   K 4.8 08/21/2015   CL 106 08/21/2015   CREATININE 0.87 08/21/2015   BUN 12 08/21/2015   CO2 24 08/21/2015   TSH 0.951 09/22/2012   INR 2.03 (H) 03/23/2015   Dated 02/16/16: Normal CMET, glucose 106. Cholesterol 187, triglycerides 85, HDL 47, LDL 123.  Dated 07/25/16: Normal Hgb, creatinine, ALT.  Dated 02/07/17: WBC 9.9K, otherwise CBC normal. A1c 5.6%. CMET normal.  Cholesterol 145, triglycerides 188, HDL 45, LDL 62.  Dated 11/13/17: cholesterol 134, triglycerides 174, HDL 36, LDL 63. TSH normal Dated 01/23/18: BMET and CBC normal. Labs from the [place] 02/10/18: Normal CBC and CMET. Cholesterol 154, triglycerides 226, HDL 41, LDL 68.    Ecg today shows NSR with first degree AV block. Otherwise normal. I have personally reviewed and interpreted this study.  Myoview 08/08/15:Study Highlights    Nuclear stress EF: 54%. Visually, the LV function appears to be greater than the computer generated  54%. Normal LV functino  There was no ST segment deviation noted during stress.  The study is normal. there is no ischemia or infarction  This is a low risk study     ASSESSMENT AND PLAN:  1. CAD:  S/p CABG in 2001. Last myoview in June 2017.  He is asymptomatic. Continue Rx.   2. Atrial Flutter:   s/p atrial flutter ablation April 2017.  Recurrent paroxysmal atrial fib/ flutter noted on monitor at [place]. He is asymptomatic. On Xarelto. He is in NSR today. 3. Hypertension:  Controlled. 4. Hyperlipidemia:  LDL is at goal on statin. Triglycerides are higher reflecting his weight gain. Discussed a low carb diet to try and lose weight. Stop drinking diet sodas.  5. S/p lumbar fusion with persistent limiting back pain. Followed by Neurosurgery.   Signed, Shaqueta Casady Martinique MD, [facility]    05/04/2018 4:06 PM

## 2018-05-04 ENCOUNTER — Encounter: Payer: Self-pay | Admitting: Cardiology

## 2018-05-04 ENCOUNTER — Ambulatory Visit (INDEPENDENT_AMBULATORY_CARE_PROVIDER_SITE_OTHER): Payer: Medicare Other | Admitting: Cardiology

## 2018-05-04 VITALS — BP 120/86 | HR 66 | Ht 71.0 in | Wt 256.8 lb

## 2018-05-04 DIAGNOSIS — I2510 Atherosclerotic heart disease of native coronary artery without angina pectoris: Secondary | ICD-10-CM | POA: Diagnosis not present

## 2018-05-04 DIAGNOSIS — E7800 Pure hypercholesterolemia, unspecified: Secondary | ICD-10-CM | POA: Diagnosis not present

## 2018-05-04 DIAGNOSIS — Z951 Presence of aortocoronary bypass graft: Secondary | ICD-10-CM

## 2018-05-04 DIAGNOSIS — I10 Essential (primary) hypertension: Secondary | ICD-10-CM | POA: Diagnosis not present

## 2018-06-25 ENCOUNTER — Other Ambulatory Visit: Payer: Self-pay | Admitting: Cardiology

## 2018-06-25 NOTE — Telephone Encounter (Signed)
68yo, 256lbs, Scr 0.83 on 02/10/18, Crcl 137ml/min Last OV 05/04/18 Indication aflutter

## 2018-07-25 ENCOUNTER — Other Ambulatory Visit: Payer: Self-pay | Admitting: Cardiology

## 2019-02-12 ENCOUNTER — Other Ambulatory Visit: Payer: Self-pay | Admitting: *Deleted

## 2019-02-12 DIAGNOSIS — I4892 Unspecified atrial flutter: Secondary | ICD-10-CM

## 2019-02-15 MED ORDER — RIVAROXABAN 20 MG PO TABS: ORAL_TABLET | ORAL | 1 refills | Status: DC

## 2019-02-15 MED ORDER — RIVAROXABAN 20 MG PO TABS: 20.0000 mg | ORAL_TABLET | Freq: Every day | ORAL | 1 refills | Status: DC

## 2019-04-08 ENCOUNTER — Other Ambulatory Visit: Payer: Self-pay | Admitting: Cardiology

## 2019-04-08 MED ORDER — RIVAROXABAN 20 MG PO TABS: 20.0000 mg | ORAL_TABLET | Freq: Every day | ORAL | 1 refills | Status: DC

## 2019-04-08 NOTE — Telephone Encounter (Signed)
Pt calling asking for a 90 day supply of his Xarelto be sent to [facility] mail order pharmacy. Please address

## 2019-04-29 NOTE — Progress Notes (Deleted)
Cardiology Office Note    Date:  04/29/2019   ID:  William [name], DOB 1950-06-13, MRN [id number]  PCP:  [name], MD  Cardiologist:  Dr. Adaleah Forget [place]     History of Present Illness: William [name] is a 69 y.o. male seen for follow up CAD and atrial flutter. He has a history of CAD, s/p CABG in 03/1999 (LIMA-LAD, RIMA-OM2, SVG-OM1/OM3, SVG-AM of the RCA), HTN, HL, atrial flutter, PMR.   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  He converted to NSR on his own and Eliquis was eventually d/c'd.  He was seen at the [facility] clinic in November 2016 and was noted to have an irregular pulse. He was  in atrial flutter. He denied any symptoms. He was  started on Xarelto. Metoprolol dose was increased for rate control. Echo showed mild LVH with normal systolic function. EF 55-60%. Mild RAE.   He underwent DCCV in Jan 2017. On follow up in Feb. He was back in atrial flutter. He subsequently underwent ablation of atrial flutter by Dr. Curt Bears on 06/15/15. No complications. His anticoagulation was stopped one month later. When he was seen  in May 2017 he was in NSR.  A Holter was done at the [place] later in May 2017 and apparently showed  paroxysmal atrial fibrillation and flutter. Burden 4%.  Mali vasc score of 3. It was recommended that he resume Xarelto.   On follow up today he reports he is doing well. He did undergo back surgery- lumbar fusion in September. He has had persistent limiting back pain and had a spinal stimulator placed in December. This still hasn't relieved his pain. This has led to inactivity and weight gain. gained 10 lbs this year. Denies palpitations or dizziness. No chest pain or SOB.   Recent Labs: No results found for requested labs within last 8760 hours.  Wt Readings from Last 3 Encounters:  05/04/18 256 lb 12.8 oz (116.5 kg)  03/28/17 246 lb 3.2 oz (111.7 kg)  10/17/16 237 lb 6.4 oz (107.7 kg)     Past Medical History:    Diagnosis Date  . AAA (abdominal aortic aneurysm) without rupture ([place]) 08/22/2015  . Abdominal mass, right lower quadrant, fatty 08/22/2015  . Atrial flutter ([place]) 09/19/2012  . CMV (cytomegalovirus infection) ([place])   . Coronary disease 2001   CABG  . Cytomegalovirus mononucleosis 09/17/2012  . Hypercholesterolemia   . Hypertension   . Insomnia   . Myocardial infarction ([place])   . Pneumonia   . Polymyalgia rheumatica ([place])   . Tobacco abuse     Current Outpatient Medications  Medication Sig Dispense Refill  . acetaminophen (TYLENOL) 650 MG CR tablet Take 1,300 mg by mouth every 8 (eight) hours as needed for pain.    Marland Kitchen albuterol (PROVENTIL HFA;VENTOLIN HFA) 108 (90 BASE) MCG/ACT inhaler Inhale 1-2 puffs into the lungs every 6 (six) hours as needed for wheezing or shortness of breath. 1 Inhaler 0  . atorvastatin (LIPITOR) 80 MG tablet TAKE 1 TABLET DAILY 90 tablet 3  . folic acid (FOLVITE) 1 MG tablet Take 1 mg by mouth daily.    Marland Kitchen lisinopril (PRINIVIL,ZESTRIL) 40 MG tablet Take 40 mg by mouth daily.    . magnesium gluconate (MAGONATE) 500 MG tablet Take 500 mg by mouth every evening.     . metoprolol (LOPRESSOR) 100 MG tablet Take 0.5 tablets (50 mg total) by mouth 2 (two) times daily. 60 tablet  0  . Multiple Vitamin (MULTIVITAMIN WITH MINERALS) TABS tablet Take 1 tablet by mouth daily.    . nitroGLYCERIN (NITROSTAT) 0.4 MG SL tablet Place 1 tablet (0.4 mg total) under the tongue every 5 (five) minutes as needed for chest pain. (Patient not taking: Reported on 05/04/2018) 25 tablet 11  . Omega-3 Fatty Acids (FISH OIL) 500 MG CAPS Take 2 capsules by mouth 2 (two) times daily.    . predniSONE (DELTASONE) 1 MG tablet Take 2 mg by mouth daily with breakfast.    . rivaroxaban (XARELTO) 20 MG TABS tablet Take 1 tablet (20 mg total) by mouth daily with supper. LAB WORK NEEDED FOR FURTHER REFILLS 90 tablet 1   No current facility-administered medications for this visit.    Allergies:    Oxycodone, Iodine, and Zocor [simvastatin]   Social History:  The patient  reports that he has been smoking cigars. He has never used smokeless tobacco. He reports current alcohol use of about 1.0 standard drinks of alcohol per week. He reports that he does not use drugs.   Family History:  The patient's family history includes Bladder Cancer in his father; Colon cancer in his mother; Heart failure in his father; Pancreatic cancer in his mother.   ROS:  Please see the history of present illness.   All other systems reviewed and negative.   PHYSICAL EXAM: VS:  There were no vitals taken for this visit. GENERAL:  Well appearing obese WM in NAD HEENT:  PERRL, EOMI, sclera are clear. Oropharynx is clear. NECK:  No jugular venous distention, carotid upstroke brisk and symmetric, no bruits, no thyromegaly or adenopathy LUNGS:  Clear to auscultation bilaterally CHEST:  Unremarkable HEART:  RRR,  PMI not displaced or sustained,S1 and S2 within normal limits, no S3, no S4: no clicks, no rubs, no murmurs ABD:  Soft, nontender. BS +, no masses or bruits. No hepatomegaly, no splenomegaly EXT:  2 + pulses throughout, no edema, no cyanosis no clubbing SKIN:  Warm and dry.  No rashes NEURO:  Alert and oriented x 3. Cranial nerves II through XII intact. PSYCH:  Cognitively intact       Lab Results  Component Value Date   WBC 3.7 (L) 08/21/2015   HGB 15.8 08/21/2015   HCT 47.6 08/21/2015   PLT 70 (L) 08/21/2015   GLUCOSE 98 08/21/2015   ALT 63 08/21/2015   AST 57 (H) 08/21/2015   NA 137 08/21/2015   K 4.8 08/21/2015   CL 106 08/21/2015   CREATININE 0.87 08/21/2015   BUN 12 08/21/2015   CO2 24 08/21/2015   TSH 0.951 09/22/2012   INR 2.03 (H) 03/23/2015   Dated 02/16/16: Normal CMET, glucose 106. Cholesterol 187, triglycerides 85, HDL 47, LDL 123.  Dated 07/25/16: Normal Hgb, creatinine, ALT.  Dated 02/07/17: WBC 9.9K, otherwise CBC normal. A1c 5.6%. CMET normal. Cholesterol 145,  triglycerides 188, HDL 45, LDL 62.  Dated 11/13/17: cholesterol 134, triglycerides 174, HDL 36, LDL 63. TSH normal Dated 01/23/18: BMET and CBC normal. Labs from the [place] 02/10/18: Normal CBC and CMET. Cholesterol 154, triglycerides 226, HDL 41, LDL 68.    Ecg today shows NSR with first degree AV block. Otherwise normal. I have personally reviewed and interpreted this study.  Myoview 08/08/15:Study Highlights    Nuclear stress EF: 54%. Visually, the LV function appears to be greater than the computer generated 54%. Normal LV functino  There was no ST segment deviation noted during stress.  The study is normal.  there is no ischemia or infarction  This is a low risk study     ASSESSMENT AND PLAN:  1. CAD:  S/p CABG in 2001. Last myoview in June 2017.  He is asymptomatic. Continue Rx.   2. Atrial Flutter:   s/p atrial flutter ablation April 2017.  Recurrent paroxysmal atrial fib/ flutter noted on monitor at [place]. He is asymptomatic. On Xarelto. He is in NSR today. 3. Hypertension:  Controlled. 4. Hyperlipidemia:  LDL is at goal on statin. Triglycerides are higher reflecting his weight gain. Discussed a low carb diet to try and lose weight. Stop drinking diet sodas.  5. S/p lumbar fusion with persistent limiting back pain. Followed by Neurosurgery.   Signed, Tamiya Colello [place] MD, [facility]    04/29/2019 12:01 PM

## 2019-05-04 ENCOUNTER — Ambulatory Visit: Payer: Medicare Other | Admitting: Cardiology

## 2019-06-04 NOTE — Progress Notes (Signed)
Virtual Visit via Video Note   This visit type was conducted due to national recommendations for restrictions regarding the COVID-19 Pandemic (e.g. social distancing) in an effort to limit this patient's exposure and mitigate transmission in our community.  Due to his co-morbid illnesses, this patient is at least at moderate risk for complications without adequate follow up.  This format is felt to be most appropriate for this patient at this time.  All issues noted in this document were discussed and addressed.  A limited physical exam was performed with this format.  Please refer to the patient's chart for his consent to telehealth for [facility].   The patient was identified using 2 identifiers.  Date:  06/07/2019   ID:  William [name], DOB [birth year], MRN [id number]  Patient Location: Home Provider Location: Home  PCP:  [name], MD  Cardiologist:  Belita Warsame Martin[place] MD Electrophysiologist:  None   Evaluation Performed:  Follow-Up Visit  Chief Complaint:  CAD and atrial flutter.  History of Present Illness:    William [name] is a 69 y.o. male with a history of CAD, s/p CABG in 03/1999 (LIMA-LAD, RIMA-OM2, SVG-OM1/OM3, SVG-AM of the RCA), HTN, HL, atrial flutter, PMR.   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  He converted to NSR on his own and Eliquis was eventually d/c'd.  He was seen at the [facility] clinic in November 2016 and was noted to have an irregular pulse. He was  in atrial flutter. He denied any symptoms. He was  started on Xarelto. Metoprolol dose was increased for rate control. Echo showed mild LVH with normal systolic function. EF 55-60%. Mild RAE.   He underwent DCCV in Jan 2017. On follow up in Feb. He was back in atrial flutter. He subsequently underwent ablation of atrial flutter by Dr. Curt Bears on 06/15/15. No complications. His anticoagulation was stopped one month later.When he was seen  in May 2017 he was in NSR. A  Holter was done at the [place] later in May 2017 and apparently showed  paroxysmal atrial fibrillation and flutter. Burden 4%.  Mali vasc score of 3. It was recommended that he resume Xarelto.   On follow up today he reports he is doing well.  He still has had persistent limiting back pain. He does have a spinal stimulator that helps some. He tries to walk as much as he can. He has lost 10 lbs recently.  Denies palpitations or dizziness. No chest pain or SOB.   The patient does not have symptoms concerning for COVID-19 infection (fever, chills, cough, or new shortness of breath).    Past Medical History:  Diagnosis Date  . AAA (abdominal aortic aneurysm) without rupture ([place]) 08/22/2015  . Abdominal mass, right lower quadrant, fatty 08/22/2015  . Atrial flutter ([place]) 09/19/2012  . CMV (cytomegalovirus infection) ([place])   . Coronary disease 2001   CABG  . Cytomegalovirus mononucleosis 09/17/2012  . Hypercholesterolemia   . Hypertension   . Insomnia   . Myocardial infarction ([place])   . Pneumonia   . Polymyalgia rheumatica ([place])   . Tobacco abuse    Past Surgical History:  Procedure Laterality Date  . CARDIAC CATHETERIZATION  03/16/1999   ejection fraction 65%--After surgery pt. developed hives and itching  . CARDIOVERSION N/A 03/28/2015   Procedure: CARDIOVERSION;  Surgeon: Skeet Latch, MD;  Location: [place];  Service: Cardiovascular;  Laterality: N/A;  . CORONARY ARTERY BYPASS GRAFT  03/19/1999  LIMA-LAD, RIMA-OM2, SVG-OM1/OM3, SVG-AM  . ELECTROPHYSIOLOGIC STUDY N/A 06/15/2015   Procedure: A-Flutter Ablation;  Surgeon: Will Meredith Leeds, MD;  Location: [facility];  Service: Cardiovascular;  Laterality: N/A;  . TRIGGER FINGER RELEASE       Current Meds  Medication Sig  . acetaminophen (TYLENOL) 650 MG CR tablet Take 1,300 mg by mouth every 8 (eight) hours as needed for pain.  Marland Kitchen albuterol (PROVENTIL HFA;VENTOLIN HFA) 108 (90 BASE) MCG/ACT inhaler Inhale 1-2 puffs into the  lungs every 6 (six) hours as needed for wheezing or shortness of breath.  Marland Kitchen atorvastatin (LIPITOR) 80 MG tablet TAKE 1 TABLET DAILY  . folic acid (FOLVITE) 1 MG tablet Take 1 mg by mouth daily.  Marland Kitchen lisinopril (PRINIVIL,ZESTRIL) 40 MG tablet Take 40 mg by mouth daily.  . magnesium gluconate (MAGONATE) 500 MG tablet Take 500 mg by mouth every evening.   . metoprolol (LOPRESSOR) 100 MG tablet Take 0.5 tablets (50 mg total) by mouth 2 (two) times daily.  . Multiple Vitamin (MULTIVITAMIN WITH MINERALS) TABS tablet Take 1 tablet by mouth daily.  . nitroGLYCERIN (NITROSTAT) 0.4 MG SL tablet Place 1 tablet (0.4 mg total) under the tongue every 5 (five) minutes as needed for chest pain.  . Omega-3 Fatty Acids (FISH OIL) 500 MG CAPS Take 2 capsules by mouth 2 (two) times daily.  . rivaroxaban (XARELTO) 20 MG TABS tablet Take 1 tablet (20 mg total) by mouth daily with supper. LAB WORK NEEDED FOR FURTHER REFILLS     Allergies:   Oxycodone, Iodine, and Zocor [simvastatin]   Social History   Tobacco Use  . Smoking status: Current Some Day Smoker    Types: Cigars  . Smokeless tobacco: Never Used  Substance Use Topics  . Alcohol use: Yes    Alcohol/week: 1.0 standard drinks    Types: 1 Cans of beer per week    Comment: Occasional beer  . Drug use: No     Family Hx: The patient's family history includes Bladder Cancer in his father; Colon cancer in his mother; Heart failure in his father; Pancreatic cancer in his mother.  ROS:   Please see the history of present illness.     All other systems reviewed and are negative.   Prior CV studies:   The following studies were reviewed today:   Myoview 08/08/15:Study Highlights    Nuclear stress EF: 54%. Visually, the LV function appears to be greater than the computer generated 54%. Normal LV functino  There was no ST segment deviation noted during stress.  The study is normal. there is no ischemia or infarction  This is a low risk study       Labs/Other Tests and Data Reviewed:    EKG:  No ECG reviewed.  Recent Labs: No results found for requested labs within last 8760 hours.   Recent Lipid Panel No results found for: CHOL, TRIG, HDL, CHOLHDL, LDLCALC, LDLDIRECT  Wt Readings from Last 3 Encounters:  06/07/19 248 lb (112.5 kg)  05/04/18 256 lb 12.8 oz (116.5 kg)  03/28/17 246 lb 3.2 oz (111.7 kg)    Dated 02/16/16: Normal CMET, glucose 106. Cholesterol 187, triglycerides 85, HDL 47, LDL 123.  Dated 07/25/16: Normal Hgb, creatinine, ALT.  Dated 02/07/17: WBC 9.9K, otherwise CBC normal. A1c 5.6%. CMET normal. Cholesterol 145, triglycerides 188, HDL 45, LDL 62.  Dated 11/13/17: cholesterol 134, triglycerides 174, HDL 36, LDL 63. TSH normal Dated 01/23/18: BMET and CBC normal. Labs from the [place] 02/10/18:  Normal CBC and CMET. Cholesterol 154, triglycerides 226, HDL 41, LDL 68.  Dated 04/26/19: ALT 55. AST 24. Otherwise chemistries are normal.    Objective:    Vital Signs:  Ht 5\' 11"  (1.803 m)   Wt 248 lb (112.5 kg)   BMI 34.59 kg/m    VITAL SIGNS:  reviewed  General: WDWM in NAD HEENT: normal Respirations unlabored. Skin dry. Neuro: alert and oriented x 3. No focal findings.   ASSESSMENT & PLAN:    1. CAD:  S/p CABG in 2001. Last myoview in June 2017.  He is asymptomatic. Continue Rx.   2. Atrial Flutter:   s/p atrial flutter ablation April 2017.  Recurrent paroxysmal atrial fib/ flutter noted on monitor at [place]. He is asymptomatic. On Xarelto.  Medications refilled.  3. Hypertension:  Controlled per patient reports.  4. Hyperlipidemia: due for follow up lipid panel. will get this at the [place] or with his primary. Continue  low carb diet to try and lose weight. 5. S/p lumbar fusion with persistent limiting back pain. Followed by Neurosurgery.   COVID-19 Education: The signs and symptoms of COVID-19 were discussed with the patient and how to seek care for testing (follow up with PCP or arrange E-visit).  The  importance of social distancing was discussed today.  Time:   Today, I have spent 10 minutes with the patient with telehealth technology discussing the above problems.     Medication Adjustments/Labs and Tests Ordered: Current medicines are reviewed at length with the patient today.  Concerns regarding medicines are outlined above.   Tests Ordered: No orders of the defined types were placed in this encounter.   Medication Changes: No orders of the defined types were placed in this encounter.   Follow Up:  In Person in 1 year(s)  Signed, Leshay Desaulniers [place], MD  06/07/2019 8:39 AM    [place]

## 2019-06-07 ENCOUNTER — Encounter: Payer: Self-pay | Admitting: Cardiology

## 2019-06-07 ENCOUNTER — Telehealth (INDEPENDENT_AMBULATORY_CARE_PROVIDER_SITE_OTHER): Payer: Medicare Other | Admitting: Cardiology

## 2019-06-07 VITALS — Ht 71.0 in | Wt 248.0 lb

## 2019-06-07 DIAGNOSIS — I10 Essential (primary) hypertension: Secondary | ICD-10-CM | POA: Diagnosis not present

## 2019-06-07 DIAGNOSIS — Z951 Presence of aortocoronary bypass graft: Secondary | ICD-10-CM

## 2019-06-07 DIAGNOSIS — I2510 Atherosclerotic heart disease of native coronary artery without angina pectoris: Secondary | ICD-10-CM

## 2019-06-07 DIAGNOSIS — E7800 Pure hypercholesterolemia, unspecified: Secondary | ICD-10-CM

## 2019-06-07 NOTE — Patient Instructions (Signed)
Medication Instructions:  Continue same medications *If you need a refill on your cardiac medications before your next appointment, please call your pharmacy*   Lab Work: None ordered    Testing/Procedures: None ordered   Follow-Up: At [facility], you and your health needs are our priority.  As part of our continuing mission to provide you with exceptional heart care, we have created designated Provider Care Teams.  These Care Teams include your primary Cardiologist (physician) and Advanced Practice Providers (APPs -  Physician Assistants and Nurse Practitioners) who all work together to provide you with the care you need, when you need it.  We recommend signing up for the patient portal called "MyChart".  Sign up information is provided on this After Visit Summary.  MyChart is used to connect with patients for Virtual Visits (Telemedicine).  Patients are able to view lab/test results, encounter notes, upcoming appointments, etc.  Non-urgent messages can be sent to your provider as well.   To learn more about what you can do with MyChart, go to [email or web address].    Your next appointment:  1 year    Call in Jan to schedule April appointment    The format for your next appointment: Office    Provider:  Dr.Jordan

## 2019-06-08 ENCOUNTER — Telehealth: Payer: Self-pay | Admitting: Cardiology

## 2019-06-08 NOTE — Telephone Encounter (Signed)
Attempted to return call to [facility] with Dr. Lorrin Jackson office, no answer, continued to ring and then call ended.   Per VV note yesterday, no labs ordered.  Due for lipid panel but was going to get this at [facility] or PCP.

## 2019-06-08 NOTE — Telephone Encounter (Signed)
Patient called his PCP's office Dr. Neta Mends and schedule an appt to have labs drawn there. Stating that we had ordered labs for him.  They did not see anything in Epic, so they were calling wanting to know what labs Dr. Martinique would like to be drawn.

## 2019-06-10 NOTE — Telephone Encounter (Signed)
I reviewed his labs and I am very pleased with results. Thanks  Collier Salina

## 2019-06-10 NOTE — Telephone Encounter (Signed)
Follow Up:     Pt called and said his lab results are ready from [place] and can be viewed.Marland Kitchen

## 2019-06-11 NOTE — Telephone Encounter (Signed)
Spoke to patient Dr.Jordan reviewed labs and was pleased with results.Advised to continue same medications.

## 2019-07-20 ENCOUNTER — Other Ambulatory Visit: Payer: Self-pay | Admitting: Cardiology

## 2019-09-18 ENCOUNTER — Other Ambulatory Visit: Payer: Self-pay | Admitting: Cardiology

## 2020-06-09 ENCOUNTER — Ambulatory Visit: Payer: Medicare Other | Admitting: Cardiology

## 2020-07-14 ENCOUNTER — Other Ambulatory Visit: Payer: Self-pay | Admitting: Cardiology

## 2020-09-01 NOTE — Progress Notes (Signed)
Cardiology Office Note    Date:  09/07/2020   ID:  [name], DOB 1950/03/09, MRN [id number]  PCP:  [name], MD  Cardiologist:  Dr. Tycho Cheramie [place]     History of Present Illness: William [name] is a 70 y.o. male seen for follow up CAD and atrial flutter. He has a history of CAD, s/p CABG in 03/1999 (LIMA-LAD, RIMA-OM2, SVG-OM1/OM3, SVG-AM of the RCA), HTN, HL, atrial flutter, PMR.   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  He converted to NSR on his own and Eliquis was eventually d/c'd.  He was seen at the [facility] clinic in November 2016 and was noted to have an irregular pulse. He was  in atrial flutter. He denied any symptoms. He was  started on Xarelto. Metoprolol dose was increased for rate control. Echo showed mild LVH with normal systolic function. EF 55-60%. Mild RAE.   He underwent DCCV in Jan 2017. On follow up in Feb. He was back in atrial flutter. He subsequently underwent ablation of atrial flutter by Dr. Curt Bears on 06/15/15. No complications. His anticoagulation was stopped one month later. When he was seen  in May 2017 he was in NSR.  A Holter was done at the [place] later in May 2017 and apparently showed  paroxysmal atrial fibrillation and flutter. Burden 4%.  Mali vasc score of 3. It was recommended that he resume Xarelto.   He was admitted at [facility] in January with right PNA and AKI. Echo at that time was OK. Apparently had a hematoma of the right flank as well. Xarelto was held but later resumed. He is off methotrexate. Trying to wean steroids. On [place] now.   He was readmitted in May with rib fractures related to coughing and osteoporosis.  On follow up today he reports he is doing OK. He still has had persistent limiting back pain and neuropathy. He does have a spinal stimulator that helps some. He is able to walk only 20 feet. Now has a motorized chair. Denies palpitations or dizziness. No chest pain or SOB.    Recent  Labs: No results found for requested labs within last 8760 hours.  Wt Readings from Last 3 Encounters:  09/07/20 259 lb (117.5 kg)  06/07/19 248 lb (112.5 kg)  05/04/18 256 lb 12.8 oz (116.5 kg)     Past Medical History:  Diagnosis Date   AAA (abdominal aortic aneurysm) without rupture ([place]) 08/22/2015   Abdominal mass, right lower quadrant, fatty 08/22/2015   Atrial flutter ([place]) 09/19/2012   CMV (cytomegalovirus infection) ([place])    Coronary disease 2001   CABG   Cytomegalovirus mononucleosis 09/17/2012   Hypercholesterolemia    Hypertension    Insomnia    Myocardial infarction (HCC)    Pneumonia    Polymyalgia rheumatica (HCC)    Tobacco abuse     Current Outpatient Medications  Medication Sig Dispense Refill   albuterol (PROVENTIL HFA;VENTOLIN HFA) 108 (90 BASE) MCG/ACT inhaler Inhale 1-2 puffs into the lungs every 6 (six) hours as needed for wheezing or shortness of breath. 1 Inhaler 0   amLODipine (NORVASC) 10 MG tablet Take 1 tablet (10 mg total) by mouth daily. 180 tablet 3   atorvastatin (LIPITOR) 80 MG tablet TAKE 1 TABLET DAILY 90 tablet 0   budesonide (PULMICORT) 0.5 MG/2ML nebulizer solution as needed.     chlorpheniramine-HYDROcodone ([place]) 10-8 MG/5ML SUER as needed.     dextromethorphan-guaiFENesin (ROBITUSSIN-DM) 10-100  MG/5ML liquid as needed.     leflunomide (ARAVA) 20 MG tablet Take 20 mg by mouth daily.     lisinopril (PRINIVIL,ZESTRIL) 40 MG tablet Take 40 mg by mouth daily.     magnesium gluconate (MAGONATE) 500 MG tablet Take 500 mg by mouth every evening.      metoprolol (LOPRESSOR) 100 MG tablet Take 0.5 tablets (50 mg total) by mouth 2 (two) times daily. 60 tablet 0   Multiple Vitamin (MULTIVITAMIN WITH MINERALS) TABS tablet Take 1 tablet by mouth daily.     nitroGLYCERIN (NITROSTAT) 0.4 MG SL tablet Place 1 tablet (0.4 mg total) under the tongue every 5 (five) minutes as needed for chest pain. 25 tablet 11   Omega-3 Fatty Acids (FISH OIL) 500 MG  CAPS Take 2 capsules by mouth 2 (two) times daily.     Oxycodone HCl 10 MG TABS Take 10 mg by mouth every 4 (four) hours as needed.     predniSONE (DELTASONE) 5 MG tablet Take 1 tablet (5 mg total) by mouth daily with breakfast.     Saccharomyces boulardii (PROBIOTIC) 250 MG CAPS Take by mouth. 60 capsule    sildenafil (VIAGRA) 100 MG tablet Take 100 mg by mouth as needed.     traMADol (ULTRAM) 50 MG tablet Take by mouth every 6 (six) hours as needed. 30 tablet    vitamin C (ASCORBIC ACID) 250 MG tablet Take 500 mg daily     XARELTO 20 MG TABS tablet TAKE 1 TABLET DAILY WITH SUPPER (LAB WORK NEEDED FOR FURTHER REFILLS) 90 tablet 3   acetaminophen (TYLENOL) 650 MG CR tablet Take 1,300 mg by mouth every 8 (eight) hours as needed for pain. (Patient not taking: Reported on 09/07/2020)     baclofen (LIORESAL) 10 MG tablet Take 10 mg three times a needed (Patient not taking: Reported on 11/07/930) 30 each 0   folic acid (FOLVITE) 1 MG tablet Take 1 mg by mouth daily. (Patient not taking: Reported on 09/07/2020)     zinc gluconate 50 MG tablet Takes 200 mg daily (Patient not taking: Reported on 09/07/2020)     No current facility-administered medications for this visit.    Allergies:   Oxycodone, Iodine, and Zocor [simvastatin]   Social History:  The patient  reports that he has been smoking cigars. He has never used smokeless tobacco. He reports current alcohol use of about 1.0 standard drink of alcohol per week. He reports that he does not use drugs.   Family History:  The patient's family history includes Bladder Cancer in his father; Colon cancer in his mother; Heart failure in his father; Pancreatic cancer in his mother.   ROS:  Please see the history of present illness.   All other systems reviewed and negative.   PHYSICAL EXAM: VS:  BP 140/84 (BP Location: Right Arm, Patient Position: Sitting, Cuff Size: Large)   Ht 5\' 11"  (1.803 m)   Wt 259 lb (117.5 kg)   SpO2 96%   BMI 36.12 kg/m  GENERAL:   Well appearing obese WM in NAD HEENT:  PERRL, EOMI, sclera are clear. Oropharynx is clear. NECK:  No jugular venous distention, carotid upstroke brisk and symmetric, no bruits, no thyromegaly or adenopathy LUNGS:  Clear to auscultation bilaterally CHEST:  Unremarkable HEART:  RRR,  PMI not displaced or sustained,S1 and S2 within normal limits, no S3, no S4: no clicks, no rubs, no murmurs ABD:  Soft, nontender. BS +, no masses or bruits. No hepatomegaly, no splenomegaly  EXT:  2 + pulses throughout, no edema, no cyanosis no clubbing SKIN:  Warm and dry.  No rashes NEURO:  Alert and oriented x 3. Cranial nerves II through XII intact. PSYCH:  Cognitively intact       Lab Results  Component Value Date   WBC 3.7 (L) 08/21/2015   HGB 15.8 08/21/2015   HCT 47.6 08/21/2015   PLT 70 (L) 08/21/2015   GLUCOSE 98 08/21/2015   ALT 63 08/21/2015   AST 57 (H) 08/21/2015   NA 137 08/21/2015   K 4.8 08/21/2015   CL 106 08/21/2015   CREATININE 0.87 08/21/2015   BUN 12 08/21/2015   CO2 24 08/21/2015   TSH 0.951 09/22/2012   INR 2.03 (H) 03/23/2015   Dated 02/16/16: Normal CMET, glucose 106. Cholesterol 187, triglycerides 85, HDL 47, LDL 123.  Dated 07/25/16: Normal Hgb, creatinine, ALT.  Dated 02/07/17: WBC 9.9K, otherwise CBC normal. A1c 5.6%. CMET normal. Cholesterol 145, triglycerides 188, HDL 45, LDL 62.  Dated 11/13/17: cholesterol 134, triglycerides 174, HDL 36, LDL 63. TSH normal Dated 01/23/18: BMET and CBC normal. Labs from the [place] 02/10/18: Normal CBC and CMET. Cholesterol 154, triglycerides 226, HDL 41, LDL 68.  Dated 05/08/20: normal CBC and CMET Dated 06/09/19; cholesterol 106, triglycerides 132, HLD 30, LDL 52.   Ecg today shows NSR with first degree AV block. Otherwise normal. Electrical artifact from spinal stimulator. I have personally reviewed and interpreted this study.  Myoview 08/08/15:Study Highlights   Nuclear stress EF: 54%. Visually, the LV function appears to be greater  than the computer generated 54%. Normal LV functino There was no ST segment deviation noted during stress. The study is normal. there is no ischemia or infarction This is a low risk study   Echo 04/03/20: Impression  Left Ventricle: EF: 60-65%.  Narrative  This result has an attachment that is not available.    Left Ventricle  Normal left ventricular size. There is mild hypertrophy. EF: 60-65%. Wall motion is normal. Doppler parameters indicate normal diastolic function.   Right Ventricle  Right ventricle is normal. Systolic function is normal.   Left Atrium  Left atrium is mildly dilated.   Right Atrium  Right atrium is mildly dilated.   Mitral Valve  There is moderate annular calcification. There is trace regurgitation.   Tricuspid Valve  Tricuspid valve structure is normal. There is trace regurgitation. The right ventricular systolic pressure is normal (<36 mmHg).   Aortic Valve  The aortic valve is tricuspid. The leaflets exhibit normal excursion. There is moderate sclerosis. There is no regurgitation or stenosis.   Pulmonic Valve  The pulmonic valve was not well visualized. Trace regurgitation.   Ascending Aorta  The aortic root is normal in size.   Pericardium  There is no pericardial effusion.   Study Details  A complete echo was performed using complete 2D, color flow Doppler and spectral Doppler. Overall the study quality was adequate. The study wastechnically difficult.  ASSESSMENT AND PLAN:  CAD:  S/p CABG in 2001. Last myoview in June 2017.  He is asymptomatic. Continue Rx.   Atrial Flutter:   s/p atrial flutter ablation April 2017.  Recurrent paroxysmal atrial fib/ flutter noted on monitor at [place]. He is asymptomatic. On Xarelto. He is in NSR today. Hypertension:  Controlled. Hyperlipidemia:  LDL is at goal on statin.  S/p lumbar fusion with persistent limiting back pain and neuropathy.  Followed by Neurosurgery. S/p PNA Rib fractures.     Arie Sabina  [place] MD, [facility]    09/07/2020 9:09 AM

## 2020-09-07 ENCOUNTER — Other Ambulatory Visit: Payer: Self-pay

## 2020-09-07 ENCOUNTER — Encounter: Payer: Self-pay | Admitting: Cardiology

## 2020-09-07 ENCOUNTER — Ambulatory Visit (INDEPENDENT_AMBULATORY_CARE_PROVIDER_SITE_OTHER): Payer: Medicare Other | Admitting: Cardiology

## 2020-09-07 VITALS — BP 140/84 | Ht 71.0 in | Wt 259.0 lb

## 2020-09-07 DIAGNOSIS — E7800 Pure hypercholesterolemia, unspecified: Secondary | ICD-10-CM

## 2020-09-07 DIAGNOSIS — Z951 Presence of aortocoronary bypass graft: Secondary | ICD-10-CM

## 2020-09-07 DIAGNOSIS — I25708 Atherosclerosis of coronary artery bypass graft(s), unspecified, with other forms of angina pectoris: Secondary | ICD-10-CM

## 2020-09-07 DIAGNOSIS — I10 Essential (primary) hypertension: Secondary | ICD-10-CM

## 2020-09-07 MED ORDER — LISINOPRIL 40 MG PO TABS: 40.0000 mg | ORAL_TABLET | Freq: Every day | ORAL | 3 refills | Status: DC

## 2020-09-07 MED ORDER — ATORVASTATIN CALCIUM 80 MG PO TABS: 80.0000 mg | ORAL_TABLET | Freq: Every day | ORAL | 3 refills | Status: DC

## 2020-09-07 MED ORDER — METOPROLOL TARTRATE 50 MG PO TABS: 50.0000 mg | ORAL_TABLET | Freq: Two times a day (BID) | ORAL | 3 refills | Status: AC

## 2020-09-07 MED ORDER — RIVAROXABAN 20 MG PO TABS: 20.0000 mg | ORAL_TABLET | Freq: Every day | ORAL | 3 refills | Status: DC

## 2020-09-07 MED ORDER — AMLODIPINE BESYLATE 10 MG PO TABS: 10.0000 mg | ORAL_TABLET | Freq: Every day | ORAL | 3 refills | Status: DC

## 2020-09-07 NOTE — Progress Notes (Signed)
Patient requested pharmacy be changed to [facility] in [place].

## 2021-03-23 ENCOUNTER — Telehealth: Payer: Self-pay | Admitting: Cardiology

## 2021-03-23 NOTE — Telephone Encounter (Signed)
Small amounts of grapefruit or grapefruit juice are ok to have.    Patient should not eat or drink large quantities of grapefruit or grapefruit juice (greater than 1.2 liters/day)

## 2021-03-23 NOTE — Telephone Encounter (Signed)
Will  route to pharmacy for input

## 2021-03-23 NOTE — Telephone Encounter (Signed)
Agree with pharm D  Adama Ferber [place] MD, [facility]

## 2021-03-23 NOTE — Telephone Encounter (Signed)
Spoke to patient Pharm D's advice given. 

## 2021-03-23 NOTE — Telephone Encounter (Signed)
Pt would like to know if it is ok to eat grapefruit while on statin medication.. please advise

## 2021-06-11 ENCOUNTER — Telehealth: Payer: Self-pay | Admitting: Cardiology

## 2021-06-11 NOTE — Telephone Encounter (Signed)
Patient c/o Palpitations:  High priority if patient c/o lightheadedness, shortness of breath, or chest pain ? ?How long have you had palpitations/irregular HR/ Afib? Are you having the symptoms now?  ?Patient states this morning he saw his PCP for an upper respiratory infection and they advised him that he was in afib. He is unsure how long he has been in afib and states he has not had any symptoms  ? ?Are you currently experiencing lightheadedness, SOB or CP?  ?No  ? ?Do you have a history of afib (atrial fibrillation) or irregular heart rhythm?  ?Yes  ? ?Have you checked your BP or HR? (document readings if available):  ?No readings available, but patient states his vitals were fine and his HR was in the 70's ? ?Are you experiencing any other symptoms?  ?No  ? ?

## 2021-06-11 NOTE — Telephone Encounter (Signed)
Spoke to patient he stated he saw PCP this morning for a upper resp infection.Stated she did a EKG which revealed Afib rate 70.Stated he was not aware he was in afib.Stated she wanted him to be seen.Appointment scheduled with Dr.Jordan 4/18 at 9:30 am.PCP's office called a copy of EKG requested.I will make Dr.Jordan aware. ?

## 2021-06-14 ENCOUNTER — Ambulatory Visit: Payer: Medicare Other | Admitting: Physician Assistant

## 2021-06-15 NOTE — Progress Notes (Signed)
? ?Cardiology Office Note ? ? ? ?Date:  06/19/2021  ? ?ID:  William [name], DOB [birth year], MRN [id number] ? ?PCP:  [name], MD  ?Cardiologist:  Dr. Sonnie Pawloski [place]   ?  ?History of Present Illness: ?William [name] is a 71 y.o. male seen for follow up CAD and atrial flutter. He has a history of CAD, s/p CABG in 03/1999 (LIMA-LAD, RIMA-OM2, SVG-OM1/OM3, SVG-AM of the RCA), HTN, HL, atrial flutter, PMR.   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  He converted to NSR on his own and Eliquis was eventually d/c'd. ? ?He was seen at the [facility] in November 2016 and was noted to have an irregular pulse. He was  in atrial flutter. He denied any symptoms. He was  started on Xarelto. Metoprolol dose was increased for rate control. Echo showed mild LVH with normal systolic function. EF 55-60%. Mild RAE.   He underwent DCCV in Jan 2017. On follow up in Feb. He was back in atrial flutter. He subsequently underwent ablation of atrial flutter by Dr. Curt Bears on 06/15/15. No complications. His anticoagulation was stopped one month later. When he was seen  in May 2017 he was in NSR.  A Holter was done at the [place] later in May 2017 and apparently showed  paroxysmal atrial fibrillation and flutter. Burden 4%.  Mali vasc score of 3. It was recommended that he resume Xarelto.  ? ?He was admitted at [facility] in January with right PNA and AKI. Echo at that time was OK. Apparently had a hematoma of the right flank as well. Xarelto was held but later resumed. He is off methotrexate. Trying to wean steroids. On [place] now.  ? ?He was readmitted in May with rib fractures related to coughing and osteoporosis. ? ?He was seen recently by primary care for respiratory infection. Notes indicate a heart rate of 49 but patient reports the physician rechecked it and was in the 70s. Ecg was done reportedly showing AFib. He was completely asymptomatic. No chest pain.He still has  persistent limiting back pain  and neuropathy. He does have a spinal stimulator that helps some. He is able to walk only 20 feet. Now has a motorized chair or uses a walker. Denies palpitations or dizziness. No chest pain. ? ? ?Recent Labs: ?No results found for requested labs within last 8760 hours. ? ?Wt Readings from Last 3 Encounters:  ?06/19/21 270 lb (122.5 kg)  ?09/07/20 259 lb (117.5 kg)  ?06/07/19 248 lb (112.5 kg)  ?  ? ?Past Medical History:  ?Diagnosis Date  ? AAA (abdominal aortic aneurysm) without rupture ([place]) 08/22/2015  ? Abdominal mass, right lower quadrant, fatty 08/22/2015  ? Atrial flutter ([place]) 09/19/2012  ? CMV (cytomegalovirus infection) ([place])   ? Coronary disease 2001  ? CABG  ? Cytomegalovirus mononucleosis 09/17/2012  ? Hypercholesterolemia   ? Hypertension   ? Insomnia   ? Myocardial infarction [facility])   ? Pneumonia   ? Polymyalgia rheumatica ([place])   ? Tobacco abuse   ? ? ?Current Outpatient Medications  ?Medication Sig Dispense Refill  ? acetaminophen (TYLENOL) 650 MG CR tablet Take 1,300 mg by mouth every 8 (eight) hours as needed for pain.    ? Adalimumab (HUMIRA PEN) 40 MG/0.4ML PNKT ADMINISTER 1 INJECTION UNDER THE SKIN EVERY OTHER WEEK    ? Adalimumab 80 MG/0.8ML PNKT INJECT '80MG'$  (0.8ML) SUBCUTANEOUSLY    ? albuterol (PROVENTIL HFA;VENTOLIN HFA) 108 (90 BASE)  MCG/ACT inhaler Inhale 1-2 puffs into the lungs every 6 (six) hours as needed for wheezing or shortness of breath. 1 Inhaler 0  ? amLODipine (NORVASC) 10 MG tablet Take 1 tablet (10 mg total) by mouth daily. 90 tablet 3  ? amoxicillin-clavulanate (AUGMENTIN) 875-125 MG tablet Take 1 tablet by mouth 2 (two) times daily.    ? atorvastatin (LIPITOR) 80 MG tablet Take 1 tablet (80 mg total) by mouth daily. 90 tablet 3  ? benzonatate (TESSALON) 100 MG capsule Take by mouth.    ? budesonide (PULMICORT) 0.5 MG/2ML nebulizer solution as needed.    ? DULoxetine (CYMBALTA) 60 MG capsule Take 60 mg by mouth daily.    ? levofloxacin (LEVAQUIN) 750 MG tablet Take 750 mg by  mouth daily.    ? lisinopril (ZESTRIL) 40 MG tablet Take 1 tablet (40 mg total) by mouth daily. 90 tablet 3  ? magnesium gluconate (MAGONATE) 500 MG tablet Take 500 mg by mouth every evening.     ? metoprolol tartrate (LOPRESSOR) 50 MG tablet Take 1 tablet (50 mg total) by mouth 2 (two) times daily. 180 tablet 3  ? Multiple Vitamin (MULTIVITAMIN WITH MINERALS) TABS tablet Take 1 tablet by mouth daily.    ? nitroGLYCERIN (NITROSTAT) 0.4 MG SL tablet Place 1 tablet (0.4 mg total) under the tongue every 5 (five) minutes as needed for chest pain. 25 tablet 11  ? Omega-3 Fatty Acids (FISH OIL) 500 MG CAPS Take 2 capsules by mouth 2 (two) times daily.    ? rivaroxaban (XARELTO) 20 MG TABS tablet Take 1 tablet (20 mg total) by mouth daily with supper. 90 tablet 3  ? Saccharomyces boulardii (PROBIOTIC) 250 MG CAPS Take by mouth. 60 capsule   ? sildenafil (VIAGRA) 100 MG tablet Take 100 mg by mouth as needed.    ? traMADol (ULTRAM) 50 MG tablet Take by mouth every 6 (six) hours as needed. 30 tablet   ? vitamin C (ASCORBIC ACID) 250 MG tablet Take 500 mg daily    ? ?No current facility-administered medications for this visit.  ? ? ?Allergies:   Oxycodone, Iodine, and Zocor [simvastatin]  ? ?Social History:  The patient  reports that he has been smoking cigars. He has never used smokeless tobacco. He reports current alcohol use of about 1.0 standard drink per week. He reports that he does not use drugs.  ? ?Family History:  The patient's family history includes Bladder Cancer in his father; Colon cancer in his mother; Heart failure in his father; Pancreatic cancer in his mother.  ? ?ROS:  Please see the history of present illness.   All other systems reviewed and negative.  ? ?PHYSICAL EXAM: ?VS:  BP 118/60   Pulse (!) 58   Ht '5\' 11"'$  (1.803 m)   Wt 270 lb (122.5 kg)   SpO2 96%   BMI 37.66 kg/m?  ?GENERAL:  Well appearing obese WM in NAD ?HEENT:  PERRL, EOMI, sclera are clear. Oropharynx is clear. ?NECK:  No jugular venous  distention, carotid upstroke brisk and symmetric, no bruits, no thyromegaly or adenopathy ?LUNGS:  few wheezes bilaterally ?CHEST:  Unremarkable ?HEART:  RRR,  PMI not displaced or sustained,S1 and S2 within normal limits, no S3, no S4: no clicks, no rubs, no murmurs ?ABD:  Soft, nontender. BS +, no masses or bruits. No hepatomegaly, no splenomegaly ?EXT:  2 + pulses throughout, no edema, no cyanosis no clubbing ?SKIN:  Warm and dry.  No rashes ?NEURO:  Alert and  oriented x 3. Cranial nerves II through XII intact. ?PSYCH:  Cognitively intact ? ? ? ? ? ? ?Lab Results  ?Component Value Date  ? WBC 3.7 (L) 08/21/2015  ? HGB 15.8 08/21/2015  ? HCT 47.6 08/21/2015  ? PLT 70 (L) 08/21/2015  ? GLUCOSE 98 08/21/2015  ? ALT 63 08/21/2015  ? AST 57 (H) 08/21/2015  ? NA 137 08/21/2015  ? K 4.8 08/21/2015  ? CL 106 08/21/2015  ? CREATININE 0.87 08/21/2015  ? BUN 12 08/21/2015  ? CO2 24 08/21/2015  ? TSH 0.951 09/22/2012  ? INR 2.03 (H) 03/23/2015  ? ?Dated 02/16/16: Normal CMET, glucose 106. Cholesterol 187, triglycerides 85, HDL 47, LDL 123.  ?Dated 07/25/16: Normal Hgb, creatinine, ALT.  ?Dated 02/07/17: WBC 9.9K, otherwise CBC normal. A1c 5.6%. CMET normal. Cholesterol 145, triglycerides 188, HDL 45, LDL 62.  ?Dated 11/13/17: cholesterol 134, triglycerides 174, HDL 36, LDL 63. TSH normal ?Dated 01/23/18: BMET and CBC normal. ?Labs from the [place] 02/10/18: Normal CBC and CMET. Cholesterol 154, triglycerides 226, HDL 41, LDL 68.  ?Dated 05/08/20: normal CBC and CMET ?Dated 06/09/19; cholesterol 106, triglycerides 132, HLD 30, LDL 52.  ?Dated 12/29/20: CBC, BMET and LFTs normal ? ?Ecg today shows NSR with first degree AV block.  Rate 58. Otherwise normal. Electrical artifact from spinal stimulator. I have personally reviewed and interpreted this study. ? ?Myoview 08/08/15:Study Highlights  ? ?Nuclear stress EF: 54%. Visually, the LV function appears to be greater than the computer generated 54%. Normal LV functino ?There was no ST segment  deviation noted during stress. ?The study is normal. there is no ischemia or infarction ?This is a low risk study  ? ?Echo 04/03/20: Impression ? ?Left Ventricle: EF: 60-65%. ? ?Narrative ? ?This result has

## 2021-06-19 ENCOUNTER — Encounter: Payer: Self-pay | Admitting: Cardiology

## 2021-06-19 ENCOUNTER — Ambulatory Visit (INDEPENDENT_AMBULATORY_CARE_PROVIDER_SITE_OTHER): Payer: Medicare Other | Admitting: Cardiology

## 2021-06-19 VITALS — BP 118/60 | HR 58 | Ht 71.0 in | Wt 270.0 lb

## 2021-06-19 DIAGNOSIS — Z951 Presence of aortocoronary bypass graft: Secondary | ICD-10-CM | POA: Diagnosis not present

## 2021-06-19 DIAGNOSIS — I10 Essential (primary) hypertension: Secondary | ICD-10-CM

## 2021-06-19 DIAGNOSIS — I25708 Atherosclerosis of coronary artery bypass graft(s), unspecified, with other forms of angina pectoris: Secondary | ICD-10-CM

## 2021-06-19 DIAGNOSIS — I480 Paroxysmal atrial fibrillation: Secondary | ICD-10-CM | POA: Diagnosis not present

## 2021-07-24 ENCOUNTER — Other Ambulatory Visit: Payer: Self-pay | Admitting: Cardiology

## 2021-09-03 ENCOUNTER — Other Ambulatory Visit: Payer: Self-pay | Admitting: Cardiology

## 2021-09-03 DIAGNOSIS — I25708 Atherosclerosis of coronary artery bypass graft(s), unspecified, with other forms of angina pectoris: Secondary | ICD-10-CM

## 2021-09-14 ENCOUNTER — Other Ambulatory Visit: Payer: Self-pay | Admitting: Cardiology

## 2021-10-22 ENCOUNTER — Telehealth (HOSPITAL_COMMUNITY): Payer: Self-pay | Admitting: *Deleted

## 2021-10-22 ENCOUNTER — Telehealth (HOSPITAL_COMMUNITY): Payer: Self-pay | Admitting: Family Medicine

## 2021-10-22 NOTE — Telephone Encounter (Signed)
Returned call from message left earlier on departmental voicemail.  Spoke with pt and his wife, Rosann Auerbach.  Advised by his physician at [facility] to participate in SET/PAD exercise program. At this time, we are unable to provide this service.  Provided them the contact number for [facility] Cardiac rehab program  as well as [facility] for possible options. Thanked me for the information. Cherre Huger, BSN Cardiac and Training and development officer

## 2021-12-16 NOTE — Progress Notes (Unsigned)
Cardiology Office Note    Date:  12/20/2021   ID:  William [name], DOB [birth year], MRN [id number]  PCP:  [name], MD  Cardiologist:  Dr. Tiffny Gemmer Martin[place]     History of Present Illness: William [name] is a 72 y.o. male seen for follow up CAD and atrial flutter. He has a history of CAD, s/p CABG in 03/1999 (LIMA-LAD, RIMA-OM2, SVG-OM1/OM3, SVG-AM of the RCA), HTN, HL, atrial flutter, PMR.   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  He converted to NSR on his own and Eliquis was d/c'd.  He was seen at the [facility] clinic in November 2016 and was noted to have an irregular pulse. He was  in atrial flutter. He denied any symptoms. He was  started on Xarelto. Metoprolol dose was increased for rate control. Echo showed mild LVH with normal systolic function. EF 55-60%. Mild RAE.   He underwent DCCV in Jan 2017. On follow up in Feb. He was back in atrial flutter. He subsequently underwent ablation of atrial flutter by Dr. Curt Bears on 06/15/15. No complications. His anticoagulation was stopped one month later. When he was seen  in May 2017 he was in NSR.  A Holter was done at the [place] later in May 2017 and apparently showed  paroxysmal atrial fibrillation and flutter. Burden 4%.  Mali vasc score of 3. It was recommended that he resume Xarelto.   He was admitted at [facility] in January 2022 with right PNA and AKI. Echo at that time was OK. Apparently had a hematoma of the right flank as well. Xarelto was held but later resumed. He is off methotrexate.   He was readmitted in May 2022 with rib fractures related to coughing and osteoporosis.  He was seen earlier this spring by primary care for respiratory infection. Notes indicate a heart rate of 49 but patient reports the physician rechecked it and was in the 70s. Ecg was done reportedly showing AFib. He was completely asymptomatic. When I saw him in April he was in NSR.  When seen by Dr Neta Mends in August his  pulse was irregular but controlled. He still has  persistent limiting back pain and neuropathy.  Now seeing Dr Oleta Mouse at [facility]. Had a couple of injections and is being considered for ablative therapy. He is followed by Dr Wynetta Emery at [facility] vascular for 4.9 cm AAA and also PAD- LE dopplers indicated right SFA and AT disease. Medical therapy recommended.  Now has a motorized chair or uses a walker. Trying to walk more. Denies palpitations or dizziness. No chest pain. Does note significant weight gain.   Sleep Apnea Evaluation  [facility]  Today's Date: 12/20/2021   Patient Name: William [name]        DOB: 1950/11/28       Height:  '5\' 11"'$  (1.803 m)     Weight: 273 lb 3.2 oz (123.9 kg)  BMI: Body mass index is 38.1 kg/m.    Referring Provider:  Izrael Peak Martinique MD   STOP-BANG RISK ASSESSMENT       12/20/2021   11:20 AM  STOP-BANG  Do you snore loudly? No  Do you often feel tired, fatigued, or sleepy during the daytime? No  Has anyone observed you stop breathing during sleep? No  Do you have (or are you being treated for) high blood pressure? Yes  Recent BMI (Calculated) 38.12  Is BMI greater than 35 kg/m2?  1=Yes  Age older than 72 years old? 1=Yes  Has large neck size > 40 cm (15.7 in, large male shirt size, large male collar size > 16) Yes  Gender - Male 1=Yes  STOP-Bang Total Score 5      If STOP-BANG Score ?3 OR two clinical symptoms - patient qualifies for WatchPAT (CPT 95800)      Sleep study ordered due to two (2) of the following clinical symptoms/diagnoses:  Excessive daytime sleepiness G47.10  Gastroesophageal reflux K21.9  Nocturia R35.1  Morning Headaches G44.221  Difficulty concentrating R41.840  Memory problems or poor judgment G31.84  Personality changes or irritability R45.4  Loud snoring R06.83  Depression F32.9  Unrefreshed by sleep G47.8  Impotence N52.9  History of high blood pressure R03.0  Insomnia G47.00  Sleep Disordered Breathing  or Sleep Apnea ICD G47.33     Recent Labs: No results found for requested labs within last 365 days.  Wt Readings from Last 3 Encounters:  12/20/21 273 lb 3.2 oz (123.9 kg)  06/19/21 270 lb (122.5 kg)  09/07/20 259 lb (117.5 kg)     Past Medical History:  Diagnosis Date   AAA (abdominal aortic aneurysm) without rupture ([place]) 08/22/2015   Abdominal mass, right lower quadrant, fatty 08/22/2015   Atrial flutter ([place]) 09/19/2012   CMV (cytomegalovirus infection) ([place])    Coronary disease 2001   CABG   Cytomegalovirus mononucleosis 09/17/2012   Hypercholesterolemia    Hypertension    Insomnia    Myocardial infarction (HCC)    Pneumonia    Polymyalgia rheumatica (HCC)    Tobacco abuse     Current Outpatient Medications  Medication Sig Dispense Refill   acetaminophen (TYLENOL) 650 MG CR tablet Take 1,300 mg by mouth every 8 (eight) hours as needed for pain.     Adalimumab (HUMIRA PEN) 40 MG/0.4ML PNKT ADMINISTER 1 INJECTION UNDER THE SKIN EVERY OTHER WEEK     amLODipine (NORVASC) 10 MG tablet TAKE 1 TABLET DAILY 90 tablet 3   atorvastatin (LIPITOR) 80 MG tablet TAKE 1 TABLET DAILY (MUST KEEP UPCOMING APPOINTMENT FOR FUTURE REFILLS) 90 tablet 3   lisinopril (ZESTRIL) 40 MG tablet TAKE 1 TABLET DAILY 90 tablet 3   magnesium gluconate (MAGONATE) 500 MG tablet Take 500 mg by mouth every evening.      metoprolol tartrate (LOPRESSOR) 50 MG tablet Take 1 tablet (50 mg total) by mouth 2 (two) times daily. 180 tablet 3   Multiple Vitamin (MULTIVITAMIN WITH MINERALS) TABS tablet Take 1 tablet by mouth daily.     Omega-3 Fatty Acids (FISH OIL) 500 MG CAPS Take 2 capsules by mouth 2 (two) times daily.     Saccharomyces boulardii (PROBIOTIC) 250 MG CAPS Take by mouth. 60 capsule    sildenafil (VIAGRA) 100 MG tablet Take 100 mg by mouth as needed.     traMADol (ULTRAM) 50 MG tablet Take by mouth every 6 (six) hours as needed. 30 tablet    vitamin C (ASCORBIC ACID) 250 MG tablet Take 500 mg daily      XARELTO 20 MG TABS tablet TAKE 1 TABLET DAILY WITH SUPPER 90 tablet 3   albuterol (PROVENTIL HFA;VENTOLIN HFA) 108 (90 BASE) MCG/ACT inhaler Inhale 1-2 puffs into the lungs every 6 (six) hours as needed for wheezing or shortness of breath. (Patient not taking: Reported on 12/20/2021) 1 Inhaler 0   nitroGLYCERIN (NITROSTAT) 0.4 MG SL tablet Place 1 tablet (0.4 mg total) under the tongue every 5 (five) minutes as needed for  chest pain. 25 tablet 11   No current facility-administered medications for this visit.    Allergies:   Oxycodone, Iodine, and Zocor [simvastatin]   Social History:  The patient  reports that he has been smoking cigars. He has never used smokeless tobacco. He reports current alcohol use of about 1.0 standard drink of alcohol per week. He reports that he does not use drugs.   Family History:  The patient's family history includes Bladder Cancer in his father; Colon cancer in his mother; Heart failure in his father; Pancreatic cancer in his mother.   ROS:  Please see the history of present illness.   All other systems reviewed and negative.   PHYSICAL EXAM: VS:  BP 110/70 (BP Location: Left Arm, Patient Position: Sitting, Cuff Size: Large)   Pulse 80   Ht '5\' 11"'$  (1.803 m)   Wt 273 lb 3.2 oz (123.9 kg)   SpO2 94%   BMI 38.10 kg/m  GENERAL:  Well appearing morbidly obese WM in NAD HEENT:  PERRL, EOMI, sclera are clear. Oropharynx is clear. NECK:  No jugular venous distention, carotid upstroke brisk and symmetric, no bruits, no thyromegaly or adenopathy LUNGS:  clear  CHEST:  Unremarkable HEART:  IRRR,  PMI not displaced or sustained,S1 and S2 within normal limits, no S3, no S4: no clicks, no rubs, no murmurs ABD:  Soft, nontender. BS +, no masses or bruits. No hepatomegaly, no splenomegaly EXT:  1+ RLE pulse. 2+ on left  no edema, no cyanosis no clubbing SKIN:  Warm and dry.  No rashes NEURO:  Alert and oriented x 3. Cranial nerves II through XII intact. PSYCH:   Cognitively intact       Lab Results  Component Value Date   WBC 3.7 (L) 08/21/2015   HGB 15.8 08/21/2015   HCT 47.6 08/21/2015   PLT 70 (L) 08/21/2015   GLUCOSE 98 08/21/2015   ALT 63 08/21/2015   AST 57 (H) 08/21/2015   NA 137 08/21/2015   K 4.8 08/21/2015   CL 106 08/21/2015   CREATININE 0.87 08/21/2015   BUN 12 08/21/2015   CO2 24 08/21/2015   TSH 0.951 09/22/2012   INR 2.03 (H) 03/23/2015   Dated 02/16/16: Normal CMET, glucose 106. Cholesterol 187, triglycerides 85, HDL 47, LDL 123.  Dated 07/25/16: Normal Hgb, creatinine, ALT.  Dated 02/07/17: WBC 9.9K, otherwise CBC normal. A1c 5.6%. CMET normal. Cholesterol 145, triglycerides 188, HDL 45, LDL 62.  Dated 11/13/17: cholesterol 134, triglycerides 174, HDL 36, LDL 63. TSH normal Dated 01/23/18: BMET and CBC normal. Labs from the [place] 02/10/18: Normal CBC and CMET. Cholesterol 154, triglycerides 226, HDL 41, LDL 68.  Dated 05/08/20: normal CBC and CMET Dated 06/09/19; cholesterol 106, triglycerides 132, HLD 30, LDL 52.  Dated 12/29/20: CBC, BMET and LFTs normal Dated 06/29/21: normal CBC and CMET  Ecg today shows Afib with rate 80.Otherwise normal. Electrical artifact from spinal stimulator. I have personally reviewed and interpreted this study.  Myoview 08/08/15:Study Highlights   Nuclear stress EF: 54%. Visually, the LV function appears to be greater than the computer generated 54%. Normal LV functino There was no ST segment deviation noted during stress. The study is normal. there is no ischemia or infarction This is a low risk study   Echo 04/03/20: Impression  Left Ventricle: EF: 60-65%.  Narrative  This result has an attachment that is not available.    Left Ventricle  Normal left ventricular size. There is mild hypertrophy. EF: 60-65%. Wall  motion is normal. Doppler parameters indicate normal diastolic function.   Right Ventricle  Right ventricle is normal. Systolic function is normal.   Left Atrium  Left  atrium is mildly dilated.   Right Atrium  Right atrium is mildly dilated.   Mitral Valve  There is moderate annular calcification. There is trace regurgitation.   Tricuspid Valve  Tricuspid valve structure is normal. There is trace regurgitation. The right ventricular systolic pressure is normal (<36 mmHg).   Aortic Valve  The aortic valve is tricuspid. The leaflets exhibit normal excursion. There is moderate sclerosis. There is no regurgitation or stenosis.   Pulmonic Valve  The pulmonic valve was not well visualized. Trace regurgitation.   Ascending Aorta  The aortic root is normal in size.   Pericardium  There is no pericardial effusion.   Study Details  A complete echo was performed using complete 2D, color flow Doppler and spectral Doppler. Overall the study quality was adequate. The study wastechnically difficult.   Echo 10/08/21: INTERPRETATION ---------------------------------------------------------------    NORMAL LEFT VENTRICULAR SYSTOLIC FUNCTION WITH MILD LVH    NORMAL RIGHT VENTRICULAR SYSTOLIC FUNCTION    VALVULAR REGURGITATION: TRIVIAL MR, MILD PR, MILD TR    NO VALVULAR STENOSIS    POOR SOUND TRANSMISSION; DEFINITY CONTRAST USED.    IRREGULAR RHYTHM THROUGHOUT EXAM.    ASSESSMENT AND PLAN:  CAD:  S/p CABG in 2001. Last myoview in June 2017.  He is asymptomatic. Continue Rx.   Atrial fibrillation/Flutter:   s/p atrial flutter ablation April 2017.  Recurrent paroxysmal atrial fib/ flutter noted on monitor at [facility] in 2017 with low burden. Recurrent early this year and again today. He is completely asymptomatic. On Xarelto. Rate is well controlled. At this point would continue rate control strategy Hypertension:  Controlled. Hyperlipidemia:  request copy of labs from [place] S/p lumbar fusion with persistent limiting back pain and neuropathy.  Now seeing Dr Oleta Mouse at [facility]. PAD with right SFA and AT stenosis. Symptoms sound more neuropathic to me. Followed by Dr Wynetta Emery at  [facility]. No LE wounds. Continue walking as much as possible AAA 4.9 cm. Also followed at [facility].  Possible sleep apnea. STOP BANG score of 5. Will arrange for [facility] PAT screening.   Follow up in 6 months.   Signed, Allard Lightsey Martinique MD, [facility]    12/20/2021 11:36 AM

## 2021-12-20 ENCOUNTER — Encounter: Payer: Self-pay | Admitting: Cardiology

## 2021-12-20 ENCOUNTER — Ambulatory Visit: Payer: Medicare Other | Attending: Cardiology | Admitting: Cardiology

## 2021-12-20 VITALS — BP 110/70 | HR 80 | Ht 71.0 in | Wt 273.2 lb

## 2021-12-20 DIAGNOSIS — I7143 Infrarenal abdominal aortic aneurysm, without rupture: Secondary | ICD-10-CM | POA: Diagnosis present

## 2021-12-20 DIAGNOSIS — I25708 Atherosclerosis of coronary artery bypass graft(s), unspecified, with other forms of angina pectoris: Secondary | ICD-10-CM | POA: Insufficient documentation

## 2021-12-20 DIAGNOSIS — I480 Paroxysmal atrial fibrillation: Secondary | ICD-10-CM | POA: Insufficient documentation

## 2021-12-20 DIAGNOSIS — I10 Essential (primary) hypertension: Secondary | ICD-10-CM | POA: Insufficient documentation

## 2021-12-20 DIAGNOSIS — Z951 Presence of aortocoronary bypass graft: Secondary | ICD-10-CM | POA: Diagnosis not present

## 2021-12-20 DIAGNOSIS — I739 Peripheral vascular disease, unspecified: Secondary | ICD-10-CM | POA: Insufficient documentation

## 2021-12-20 MED ORDER — NITROGLYCERIN 0.4 MG SL SUBL: 0.4000 mg | SUBLINGUAL_TABLET | SUBLINGUAL | 11 refills | Status: DC | PRN

## 2021-12-21 ENCOUNTER — Telehealth: Payer: Self-pay

## 2021-12-21 ENCOUNTER — Telehealth: Payer: Self-pay | Admitting: *Deleted

## 2021-12-21 NOTE — Telephone Encounter (Signed)
Staff message sent to Stacy Green ok to activate itamar device.  

## 2021-12-21 NOTE — Telephone Encounter (Signed)
Called and made the patient aware that He may proceed with the [facility] Sleep Study. PIN # provided to the patient. Patient made aware that He will be contacted after the test has been read with the results and any recommendations. Patient verbalized understanding and thanked me for the call.

## 2021-12-27 ENCOUNTER — Encounter: Payer: Self-pay | Admitting: Cardiology

## 2021-12-27 DIAGNOSIS — G4731 Primary central sleep apnea: Secondary | ICD-10-CM | POA: Diagnosis not present

## 2021-12-27 DIAGNOSIS — G4733 Obstructive sleep apnea (adult) (pediatric): Secondary | ICD-10-CM | POA: Diagnosis not present

## 2021-12-28 ENCOUNTER — Telehealth: Payer: Self-pay | Admitting: *Deleted

## 2021-12-28 ENCOUNTER — Ambulatory Visit: Payer: Medicare Other | Attending: Cardiology

## 2021-12-28 DIAGNOSIS — I25708 Atherosclerosis of coronary artery bypass graft(s), unspecified, with other forms of angina pectoris: Secondary | ICD-10-CM

## 2021-12-28 DIAGNOSIS — I739 Peripheral vascular disease, unspecified: Secondary | ICD-10-CM

## 2021-12-28 DIAGNOSIS — I7143 Infrarenal abdominal aortic aneurysm, without rupture: Secondary | ICD-10-CM

## 2021-12-28 DIAGNOSIS — I10 Essential (primary) hypertension: Secondary | ICD-10-CM

## 2021-12-28 DIAGNOSIS — I480 Paroxysmal atrial fibrillation: Secondary | ICD-10-CM

## 2021-12-28 DIAGNOSIS — Z951 Presence of aortocoronary bypass graft: Secondary | ICD-10-CM

## 2021-12-28 NOTE — Telephone Encounter (Signed)
error 

## 2021-12-28 NOTE — Telephone Encounter (Signed)
Opened in error

## 2021-12-28 NOTE — Procedures (Signed)
SLEEP STUDY REPORT Patient Information Study Date: 12/27/21 Patient Name: William [name] Patient ID: [id number] Birth Date: 30-Jan-2051 Age: 71 Gender: Male BMI: 38.3 (W=273 lb, H=5' 11'') Referring Physician: Peter Swaziland, MD  TEST DESCRIPTION: Home sleep apnea testing was completed using the WatchPat, a Type 1 device, utilizing peripheral arterial tonometry (PAT), chest movement, actigraphy, pulse oximetry, pulse rate, body position and snore.  AHI was calculated with apnea and hypopnea using valid sleep time as the denominator. RDI includes apneas, hypopneas, and RERAs.  The data acquired and the scoring of sleep and all associated events were performed in accordance with the recommended standards and specifications as outlined in the AASM Manual for the Scoring of Sleep and Associated Events 2.2.0 (2015).  FINDINGS:  1.  Severe Obstructive Sleep Apnea with AHI 45/hr.   2.  Moderate Central Sleep Apnea with pAHIc 15.8/hr.  3.  Oxygen desaturations as low as 62%.  4.  Severe snoring was present. O2 sats were < 88% for 44.3 min.  5.  Total sleep time was 8 hrs and 36 min.  6.  22.4% of total sleep time was spent in REM sleep.   7.  Normal sleep onset latency at 21 min.   8.  Shortened REM sleep onset latency at 79 min.   9.  Total awakenings were 11.  10. Arrhythmia detection:  None  DIAGNOSIS:   Severe Obstructive Sleep Apnea (G47.33) Moderate Central Sleep Apnea Nocturnal Hypoxemia  RECOMMENDATIONS:   1.  Clinical correlation of these findings is necessary.  The decision to treat obstructive sleep apnea (OSA) is usually based on the presence of apnea symptoms or the presence of associated medical conditions such as Hypertension, Congestive Heart Failure, Atrial Fibrillation or Obesity.  The most common symptoms of OSA are snoring, gasping for breath while sleeping, daytime sleepiness and fatigue.   2.  Initiating apnea therapy is recommended given the presence of  symptoms and/or associated conditions. Recommend proceeding with one of the following:     a.  Auto-CPAP therapy with a pressure range of 5-20cm H2O.     b.  An oral appliance (OA) that can be obtained from certain dentists with expertise in sleep medicine.  These are primarily of use in non-obese patients with mild and moderate disease.     c.  An ENT consultation which may be useful to look for specific causes of obstruction and possible treatment options.     d.  If patient is intolerant to PAP therapy, consider referral to ENT for evaluation for hypoglossal nerve stimulator.   3.  Close follow-up is necessary to ensure success with CPAP or oral appliance therapy for maximum benefit.  4.  A follow-up oximetry study on CPAP is recommended to assess the adequacy of therapy and determine the need for supplemental oxygen or the potential need for Bi-level therapy.  An arterial blood gas to determine the adequacy of baseline ventilation and oxygenation should also be considered.  5.  Healthy sleep recommendations include:  adequate nightly sleep (normal 7-9 hrs/night), avoidance of caffeine after noon and alcohol near bedtime, and maintaining a sleep environment that is cool, dark and quiet.  6.  Weight loss for overweight patients is recommended.  Even modest amounts of weight loss can significantly improve the severity of sleep apnea.  7.  Snoring recommendations include:  weight loss where appropriate, side sleeping, and avoidance of alcohol before bed.  8.  Operation of motor vehicle should be avoided  when sleepy.  Signature:   Armanda Magic, MD; [facility]; Diplomat, American Board of Sleep Medicine Electronically Signed: 12/28/21  Page 2 of 5

## 2021-12-31 ENCOUNTER — Encounter (HOSPITAL_BASED_OUTPATIENT_CLINIC_OR_DEPARTMENT_OTHER): Payer: Medicare Other | Admitting: Cardiology

## 2021-12-31 ENCOUNTER — Telehealth: Payer: Self-pay | Admitting: *Deleted

## 2021-12-31 DIAGNOSIS — G4733 Obstructive sleep apnea (adult) (pediatric): Secondary | ICD-10-CM | POA: Diagnosis not present

## 2021-12-31 NOTE — Telephone Encounter (Signed)
Called patient to inform him of William [name] sleep results read by Dr Radford Pax. Patient stated these results are not for him. He has not done his test yet. It is still in the box. " I wanted to wait until I was more relaxed before I done it."  William [name] was notified of the error. She asked that I print off a copy for her review. Staff message was sent to Dr Fransico Him, notifying her of this error.

## 2022-01-01 NOTE — Procedures (Signed)
Patient Information Study Date: 12/31/21 Patient Name: William [name] Patient ID: [id number] Birth Date: 08-Feb-2051 Age: 71 Gender: Male BMI: 38.3 (W=273 lb, H=5' 11'') Referring Physician: Peter Martinique, MD  TEST DESCRIPTION: Home sleep apnea testing was completed using the WatchPat, a Type 1 device, utilizing peripheral arterial tonometry (PAT), chest movement, actigraphy, pulse oximetry, pulse rate, body position and snore.  AHI was calculated with apnea and hypopnea using valid sleep time as the denominator. RDI includes apneas, hypopneas, and RERAs.  The data acquired and the scoring of sleep and all associated events were performed in accordance with the recommended standards and specifications as outlined in the AASM Manual for the Scoring of Sleep and Associated Events 2.2.0 (2015).  FINDINGS:  1.  Severe Obstructive Sleep Apnea with AHI 42.4/hr.   2.  Mild Central Sleep Apnea with pAHIc 13.9/hr.  3.  Oxygen desaturations as low as 79%.  4.  Moderate snoring was present. O2 sats were < 88% for 18.4 min.  5.  Total sleep time was 7 hrs and 7 min.  6.  23.5% of total sleep time was spent in REM sleep.   7.  Normal sleep onset latency at 19 min.   8.  Shortened REM sleep onset latency at 62 min.   9.  Total awakenings were 11.  10. Arrhythmia detection:  Suggestive of possible brief atrial fibrillation lasting 6 hours 61mn and 15seconds.  This is not diagnostic and further testing with outpatient telemetry monitoring is recommended.  DIAGNOSIS:   Severe Obstructive Sleep Apnea (G47.33) Mild Central Sleep Apnea Nocturnal Hypoxemia Possible Atrial Fibrillation  RECOMMENDATIONS:   1.  Clinical correlation of these findings is necessary.  The decision to treat obstructive sleep apnea (OSA) is usually based on the presence of apnea symptoms or the presence of associated medical conditions such as Hypertension, Congestive Heart Failure, Atrial Fibrillation or Obesity.  The most  common symptoms of OSA are snoring, gasping for breath while sleeping, daytime sleepiness and fatigue.   2.  Initiating apnea therapy is recommended given the presence of symptoms and/or associated conditions. Recommend proceeding with one of the following:     a.  Auto-CPAP therapy with a pressure range of 5-20cm H2O.     b.  An oral appliance (OA) that can be obtained from certain dentists with expertise in sleep medicine.  These are primarily of use in non-obese patients with mild and moderate disease.     c.  An ENT consultation which may be useful to look for specific causes of obstruction and possible treatment options.     d.  If patient is intolerant to PAP therapy, consider referral to ENT for evaluation for hypoglossal nerve stimulator.   3.  Close follow-up is necessary to ensure success with CPAP or oral appliance therapy for maximum benefit.  4.  A follow-up oximetry study on CPAP is recommended to assess the adequacy of therapy and determine the need for supplemental oxygen or the potential need for Bi-level therapy.  An arterial blood gas to determine the adequacy of baseline ventilation and oxygenation should also be considered.  5.  Healthy sleep recommendations include:  adequate nightly sleep (normal 7-9 hrs/night), avoidance of caffeine after noon and alcohol near bedtime, and maintaining a sleep environment that is cool, dark and quiet.  6.  Weight loss for overweight patients is recommended.  Even modest amounts of weight loss can significantly improve the severity of sleep apnea.  7.  Snoring recommendations include:  weight  loss where appropriate, side sleeping, and avoidance of alcohol before bed.  8.  Operation of motor vehicle should be avoided when sleepy.  9.  Consider outpatient event monitor to evaluate for atria arrhythmias if clinically indicated.  Signature: Fransico Him, MD; [facility]; [place], [place] Board of Sleep Medicine  Electronically Signed:  01/01/22

## 2022-01-01 NOTE — Addendum Note (Signed)
Addended by: Fransico Him R on: 01/01/2022 10:49 AM   Modules accepted: Orders, Level of Service

## 2022-01-01 NOTE — Progress Notes (Signed)
This encounter was created in error - please disregard.

## 2022-01-03 ENCOUNTER — Ambulatory Visit: Payer: Medicare Other | Attending: Cardiology

## 2022-01-03 ENCOUNTER — Other Ambulatory Visit: Payer: Self-pay

## 2022-01-03 DIAGNOSIS — G4733 Obstructive sleep apnea (adult) (pediatric): Secondary | ICD-10-CM

## 2022-01-07 ENCOUNTER — Telehealth: Payer: Self-pay | Admitting: Cardiology

## 2022-01-07 NOTE — Telephone Encounter (Signed)
Pt calling to f/u on a callback that he was to receive last week regarding sleep results. Please advise

## 2022-01-08 ENCOUNTER — Other Ambulatory Visit: Payer: Self-pay | Admitting: Cardiology

## 2022-01-08 DIAGNOSIS — G4733 Obstructive sleep apnea (adult) (pediatric): Secondary | ICD-10-CM

## 2022-01-08 NOTE — Telephone Encounter (Signed)
-----   Message from Sueanne Margarita, MD sent at 01/01/2022  5:19 PM EDT ----- Please let patient know that they have sleep apnea.  Recommend therapeutic CPAP titration for treatment of patient's sleep disordered breathing.  If unable to perform an in lab titration then initiate ResMed auto CPAP from 4 to 15cm H2O with heated humidity and mask of choice and overnight pulse ox on CPAP.

## 2022-01-08 NOTE — Telephone Encounter (Signed)
Patient notified of sleep study results and recommendations. He agrees to proceed with CPAP titration sleep study.

## 2022-07-02 NOTE — Progress Notes (Signed)
Cardiology Office Note    Date:  12/20/2021   ID:  William [name], DOB 1950/09/27, MRN [id number]  PCP:  Vivien Presto, MD  Cardiologist:  Dr. Annalina Needles [place]     History of Present Illness: William [name] is a 72 y.o. male seen for follow up CAD and atrial flutter. He has a history of CAD, s/p CABG in 03/1999 (LIMA-LAD, RIMA-OM2, SVG-OM1/OM3, SVG-AM of the RCA), HTN, HL, atrial flutter, PMR.   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  He converted to NSR on his own and Eliquis was d/c'd.  He was seen at the [facility] clinic in November 2016 and was noted to have an irregular pulse. He was  in atrial flutter. He denied any symptoms. He was  started on Xarelto. Metoprolol dose was increased for rate control. Echo showed mild LVH with normal systolic function. EF 55-60%. Mild RAE.   He underwent DCCV in Jan 2017. On follow up in Feb. He was back in atrial flutter. He subsequently underwent ablation of atrial flutter by Dr. Elberta Fortis on 06/15/15. No complications. His anticoagulation was stopped one month later. When he was seen  in May 2017 he was in NSR.  A Holter was done at the [place] later in May 2017 and apparently showed  paroxysmal atrial fibrillation and flutter. Burden 4%.  [place] vasc score of 3. It was recommended that he resume Xarelto.   He was admitted at [facility] in January 2022 with right PNA and AKI. Echo at that time was OK. Apparently had a hematoma of the right flank as well. Xarelto was held but later resumed. He is off methotrexate.   He was readmitted in May 2022 with rib fractures related to coughing and osteoporosis.  When seen by Dr Salvadore Farber in August his pulse was irregular but controlled. He still has  persistent limiting back pain and neuropathy.  Now seeing Dr Verdie Mosher at [facility]. Had a couple of injections and is being considered for ablative therapy. He is followed by Dr Laural Benes at [facility] vascular for AAA and also PAD- LE dopplers indicated  right SFA and AT disease. Medical therapy recommended.  Now has a motorized chair or uses a walker.  He did have Itmar sleep study demonstrating severe OSA. Auto CPAP titration recommended but he has not had this fitted yet.  Was seen by vascular surgery this past month. CT showed AAA stable at 4.3 cm. Severe stenosis of right profunda and SFA. Planning to have fem- fem bypass in June for claudication. He denies any dyspnea or chest pain. He is not able to walk more than a few feet.    Recent Labs: No results found for requested labs within last 365 days.  Wt Readings from Last 3 Encounters:  12/20/21 273 lb 3.2 oz (123.9 kg)  06/19/21 270 lb (122.5 kg)  09/07/20 259 lb (117.5 kg)     Past Medical History:  Diagnosis Date   AAA (abdominal aortic aneurysm) without rupture (HCC) 08/22/2015   Abdominal mass, right lower quadrant, fatty 08/22/2015   Atrial flutter (HCC) 09/19/2012   CMV (cytomegalovirus infection) (HCC)    Coronary disease 2001   CABG   Cytomegalovirus mononucleosis 09/17/2012   Hypercholesterolemia    Hypertension    Insomnia    Myocardial infarction (HCC)    Pneumonia    Polymyalgia rheumatica (HCC)    Tobacco abuse     Current Outpatient Medications  Medication Sig Dispense Refill  acetaminophen (TYLENOL) 650 MG CR tablet Take 1,300 mg by mouth every 8 (eight) hours as needed for pain.     Adalimumab (HUMIRA PEN) 40 MG/0.4ML PNKT ADMINISTER 1 INJECTION UNDER THE SKIN EVERY OTHER WEEK     amLODipine (NORVASC) 10 MG tablet TAKE 1 TABLET DAILY 90 tablet 3   atorvastatin (LIPITOR) 80 MG tablet TAKE 1 TABLET DAILY (MUST KEEP UPCOMING APPOINTMENT FOR FUTURE REFILLS) 90 tablet 3   lisinopril (ZESTRIL) 40 MG tablet TAKE 1 TABLET DAILY 90 tablet 3   magnesium gluconate (MAGONATE) 500 MG tablet Take 500 mg by mouth every evening.      metoprolol tartrate (LOPRESSOR) 50 MG tablet Take 1 tablet (50 mg total) by mouth 2 (two) times daily. 180 tablet 3   Multiple Vitamin  (MULTIVITAMIN WITH MINERALS) TABS tablet Take 1 tablet by mouth daily.     Omega-3 Fatty Acids (FISH OIL) 500 MG CAPS Take 2 capsules by mouth 2 (two) times daily.     Saccharomyces boulardii (PROBIOTIC) 250 MG CAPS Take by mouth. 60 capsule    sildenafil (VIAGRA) 100 MG tablet Take 100 mg by mouth as needed.     traMADol (ULTRAM) 50 MG tablet Take by mouth every 6 (six) hours as needed. 30 tablet    vitamin C (ASCORBIC ACID) 250 MG tablet Take 500 mg daily     XARELTO 20 MG TABS tablet TAKE 1 TABLET DAILY WITH SUPPER 90 tablet 3   albuterol (PROVENTIL HFA;VENTOLIN HFA) 108 (90 BASE) MCG/ACT inhaler Inhale 1-2 puffs into the lungs every 6 (six) hours as needed for wheezing or shortness of breath. (Patient not taking: Reported on 12/20/2021) 1 Inhaler 0   nitroGLYCERIN (NITROSTAT) 0.4 MG SL tablet Place 1 tablet (0.4 mg total) under the tongue every 5 (five) minutes as needed for chest pain. 25 tablet 11   No current facility-administered medications for this visit.    Allergies:   Oxycodone, Iodine, and Zocor [simvastatin]   Social History:  The patient  reports that he has been smoking cigars. He has never used smokeless tobacco. He reports current alcohol use of about 1.0 standard drink of alcohol per week. He reports that he does not use drugs.   Family History:  The patient's family history includes Bladder Cancer in his father; Colon cancer in his mother; Heart failure in his father; Pancreatic cancer in his mother.   ROS:  Please see the history of present illness.   All other systems reviewed and negative.   PHYSICAL EXAM: VS:  BP 110/70 (BP Location: Left Arm, Patient Position: Sitting, Cuff Size: Large)   Pulse 80   Ht 5\' 11"  (1.803 m)   Wt 273 lb 3.2 oz (123.9 kg)   SpO2 94%   BMI 38.10 kg/m  GENERAL:  Well appearing morbidly obese WM in NAD HEENT:  PERRL, EOMI, sclera are clear. Oropharynx is clear. NECK:  No jugular venous distention, carotid upstroke brisk and symmetric,  no bruits, no thyromegaly or adenopathy LUNGS:  clear  CHEST:  Unremarkable HEART:  IRRR,  PMI not displaced or sustained,S1 and S2 within normal limits, no S3, no S4: no clicks, no rubs, no murmurs ABD:  Soft, nontender. BS +, no masses or bruits. No hepatomegaly, no splenomegaly EXT:  1+ RLE pulse. 2+ on left  no edema, no cyanosis no clubbing SKIN:  Warm and dry.  No rashes NEURO:  Alert and oriented x 3. Cranial nerves II through XII intact. PSYCH:  Cognitively intact  Lab Results  Component Value Date   WBC 3.7 (L) 08/21/2015   HGB 15.8 08/21/2015   HCT 47.6 08/21/2015   PLT 70 (L) 08/21/2015   GLUCOSE 98 08/21/2015   ALT 63 08/21/2015   AST 57 (H) 08/21/2015   NA 137 08/21/2015   K 4.8 08/21/2015   CL 106 08/21/2015   CREATININE 0.87 08/21/2015   BUN 12 08/21/2015   CO2 24 08/21/2015   TSH 0.951 09/22/2012   INR 2.03 (H) 03/23/2015   Dated 02/16/16: Normal CMET, glucose 106. Cholesterol 187, triglycerides 85, HDL 47, LDL 123.  Dated 07/25/16: Normal Hgb, creatinine, ALT.  Dated 02/07/17: WBC 9.9K, otherwise CBC normal. A1c 5.6%. CMET normal. Cholesterol 145, triglycerides 188, HDL 45, LDL 62.  Dated 11/13/17: cholesterol 134, triglycerides 174, HDL 36, LDL 63. TSH normal Dated 01/23/18: BMET and CBC normal. Labs from the [place] 02/10/18: Normal CBC and CMET. Cholesterol 154, triglycerides 226, HDL 41, LDL 68.  Dated 05/08/20: normal CBC and CMET Dated 06/09/19; cholesterol 106, triglycerides 132, HLD 30, LDL 52.  Dated 12/29/20: CBC, BMET and LFTs normal Dated 06/29/21: normal CBC and CMET Dated 01/01/22: normal CMET Dated 05/09/22: normal BMET and CBC.   Ecg not done today   Myoview 08/08/15:Study Highlights   Nuclear stress EF: 54%. Visually, the LV function appears to be greater than the computer generated 54%. Normal LV functino There was no ST segment deviation noted during stress. The study is normal. there is no ischemia or infarction This is a low risk study    Echo 04/03/20: Impression  Left Ventricle: EF: 60-65%.  Narrative  This result has an attachment that is not available.    Left Ventricle  Normal left ventricular size. There is mild hypertrophy. EF: 60-65%. Wall motion is normal. Doppler parameters indicate normal diastolic function.   Right Ventricle  Right ventricle is normal. Systolic function is normal.   Left Atrium  Left atrium is mildly dilated.   Right Atrium  Right atrium is mildly dilated.   Mitral Valve  There is moderate annular calcification. There is trace regurgitation.   Tricuspid Valve  Tricuspid valve structure is normal. There is trace regurgitation. The right ventricular systolic pressure is normal (<36 mmHg).   Aortic Valve  The aortic valve is tricuspid. The leaflets exhibit normal excursion. There is moderate sclerosis. There is no regurgitation or stenosis.   Pulmonic Valve  The pulmonic valve was not well visualized. Trace regurgitation.   Ascending Aorta  The aortic root is normal in size.   Pericardium  There is no pericardial effusion.   Study Details  A complete echo was performed using complete 2D, color flow Doppler and spectral Doppler. Overall the study quality was adequate. The study wastechnically difficult.   Echo 10/08/21: INTERPRETATION ---------------------------------------------------------------    NORMAL LEFT VENTRICULAR SYSTOLIC FUNCTION WITH MILD LVH    NORMAL RIGHT VENTRICULAR SYSTOLIC FUNCTION    VALVULAR REGURGITATION: TRIVIAL MR, MILD PR, MILD TR    NO VALVULAR STENOSIS    POOR SOUND TRANSMISSION; DEFINITY CONTRAST USED.    IRREGULAR RHYTHM THROUGHOUT EXAM.    ASSESSMENT AND PLAN:  CAD:  S/p CABG in 2001. Last myoview in June 2017.  He is asymptomatic but activity is very limited. He needs pre op clearance for Fem fem bypass which carries a higher CV risk. Recommend updating Steffanie Dunn for CV risk assessment. If OK will clear for surgery and he can hold  Xarelto for 3 days prior to surgery.  Continue Rx.  Atrial fibrillation/Flutter:   s/p atrial flutter ablation April 2017.  Recurrent paroxysmal atrial fib/ flutter noted on monitor at [facility] in 2017 with low burden. Recurrent early this year and again today. He is completely asymptomatic. On Xarelto. Rate is well controlled. At this point would continue rate control strategy Hypertension:  Controlled. Hyperlipidemia:  will check CMET and lipid panel today. Renew lipitor.  S/p lumbar fusion with persistent limiting back pain and neuropathy.  Now seeing Dr Verdie Mosher at [facility]. PAD with right SFA and profunda stenosis. Difficult to discern if symptoms more related to circulation or neuropathy. Followed by Dr Laural Benes at [facility]. Plan Fem fem bypass in June  AAA 4.3 cm. Also followed at [facility].  Severe  sleep apnea. Needs follow up with sleep medicine. Will arrange.  Follow up in 6 months.   Signed, Saarah Dewing [place] MD, [facility]    12/20/2021 11:36 AM

## 2022-07-05 ENCOUNTER — Encounter: Payer: Self-pay | Admitting: Cardiology

## 2022-07-05 ENCOUNTER — Ambulatory Visit: Payer: Medicare Other | Attending: Cardiology | Admitting: Cardiology

## 2022-07-05 VITALS — BP 122/74 | HR 52 | Ht 71.0 in | Wt 259.4 lb

## 2022-07-05 DIAGNOSIS — I25708 Atherosclerosis of coronary artery bypass graft(s), unspecified, with other forms of angina pectoris: Secondary | ICD-10-CM

## 2022-07-05 DIAGNOSIS — I7143 Infrarenal abdominal aortic aneurysm, without rupture: Secondary | ICD-10-CM | POA: Diagnosis present

## 2022-07-05 DIAGNOSIS — G4733 Obstructive sleep apnea (adult) (pediatric): Secondary | ICD-10-CM

## 2022-07-05 DIAGNOSIS — I480 Paroxysmal atrial fibrillation: Secondary | ICD-10-CM

## 2022-07-05 DIAGNOSIS — E7800 Pure hypercholesterolemia, unspecified: Secondary | ICD-10-CM | POA: Diagnosis present

## 2022-07-05 DIAGNOSIS — Z951 Presence of aortocoronary bypass graft: Secondary | ICD-10-CM | POA: Diagnosis present

## 2022-07-05 DIAGNOSIS — I739 Peripheral vascular disease, unspecified: Secondary | ICD-10-CM

## 2022-07-05 DIAGNOSIS — I10 Essential (primary) hypertension: Secondary | ICD-10-CM | POA: Diagnosis present

## 2022-07-05 LAB — LIPID PANEL
Chol/HDL Ratio: 3.6 ratio (ref 0.0–5.0)
Cholesterol, Total: 130 mg/dL (ref 100–199)
HDL: 36 mg/dL — ABNORMAL LOW (ref >39)
LDL Chol Calc (NIH): 74 mg/dL (ref 0–99)
Triglycerides: 109 mg/dL (ref 0–149)
VLDL Cholesterol Cal: 20 mg/dL (ref 5–40)

## 2022-07-05 LAB — COMPREHENSIVE METABOLIC PANEL
ALT: 28 IU/L (ref 0–44)
AST: 22 IU/L (ref 0–40)
Albumin/Globulin Ratio: 2 (ref 1.2–2.2)
Albumin: 4.1 g/dL (ref 3.8–4.8)
Alkaline Phosphatase: 99 IU/L (ref 44–121)
BUN/Creatinine Ratio: 22 (ref 10–24)
BUN: 18 mg/dL (ref 8–27)
Bilirubin Total: 1.7 mg/dL — ABNORMAL HIGH (ref 0.0–1.2)
CO2: 22 mmol/L (ref 20–29)
Calcium: 9.1 mg/dL (ref 8.6–10.2)
Chloride: 104 mmol/L (ref 96–106)
Creatinine, Ser: 0.83 mg/dL (ref 0.76–1.27)
Globulin, Total: 2.1 g/dL (ref 1.5–4.5)
Glucose: 95 mg/dL (ref 70–99)
Potassium: 5.1 mmol/L (ref 3.5–5.2)
Sodium: 139 mmol/L (ref 134–144)
Total Protein: 6.2 g/dL (ref 6.0–8.5)
eGFR: 92 mL/min/{1.73_m2} (ref >59)

## 2022-07-05 MED ORDER — ATORVASTATIN CALCIUM 80 MG PO TABS: 80.0000 mg | ORAL_TABLET | Freq: Every day | ORAL | 3 refills | Status: DC

## 2022-07-05 NOTE — Patient Instructions (Signed)
Medication Instructions:  Continue same medications *If you need a refill on your cardiac medications before your next appointment, please call your pharmacy*   Lab Work: Cmet,lipid   Testing/Procedures: Pension scheme manager   Follow-Up: At [facility], you and your health needs are our priority.  As part of our continuing mission to provide you with exceptional heart care, we have created designated Provider Care Teams.  These Care Teams include your primary Cardiologist (physician) and Advanced Practice Providers (APPs -  Physician Assistants and Nurse Practitioners) who all work together to provide you with the care you need, when you need it.  We recommend signing up for the patient portal called "MyChart".  Sign up information is provided on this After Visit Summary.  MyChart is used to connect with patients for Virtual Visits (Telemedicine).  Patients are able to view lab/test results, encounter notes, upcoming appointments, etc.  Non-urgent messages can be sent to your provider as well.   To learn more about what you can do with MyChart, go to [email or web address].    Your next appointment:  6 months    Provider:  Dr.Jordan

## 2022-07-05 NOTE — Addendum Note (Signed)
Addended by: Neoma Laming on: 07/05/2022 09:40 AM   Modules accepted: Orders

## 2022-07-19 ENCOUNTER — Other Ambulatory Visit: Payer: Self-pay | Admitting: Cardiology

## 2022-07-24 ENCOUNTER — Encounter (HOSPITAL_COMMUNITY): Payer: Self-pay

## 2022-07-24 ENCOUNTER — Telehealth (HOSPITAL_COMMUNITY): Payer: Self-pay | Admitting: *Deleted

## 2022-07-24 NOTE — Telephone Encounter (Signed)
Spoke with pt and gave instructions for MPI.

## 2022-08-01 ENCOUNTER — Ambulatory Visit (HOSPITAL_COMMUNITY): Payer: Medicare Other | Attending: Cardiology

## 2022-08-01 ENCOUNTER — Encounter (HOSPITAL_COMMUNITY): Payer: Self-pay

## 2022-08-01 ENCOUNTER — Other Ambulatory Visit (HOSPITAL_COMMUNITY): Payer: Self-pay | Admitting: Cardiology

## 2022-08-01 ENCOUNTER — Ambulatory Visit (HOSPITAL_COMMUNITY): Payer: Medicare Other

## 2022-08-01 DIAGNOSIS — Z951 Presence of aortocoronary bypass graft: Secondary | ICD-10-CM

## 2022-08-01 DIAGNOSIS — I480 Paroxysmal atrial fibrillation: Secondary | ICD-10-CM

## 2022-08-01 DIAGNOSIS — Z01818 Encounter for other preprocedural examination: Secondary | ICD-10-CM

## 2022-08-01 DIAGNOSIS — I25708 Atherosclerosis of coronary artery bypass graft(s), unspecified, with other forms of angina pectoris: Secondary | ICD-10-CM

## 2022-08-01 LAB — MYOCARDIAL PERFUSION IMAGING
Base ST Depression (mm): 0 mm
LV dias vol: 87 mL (ref 62–150)
LV sys vol: 48 mL
Nuc Stress EF: 45 %
Peak HR: 86 {beats}/min
Rest HR: 64 {beats}/min
Rest Nuclear Isotope Dose: 10.8 mCi
SDS: 3
SRS: 0
SSS: 3
ST Depression (mm): 0 mm
Stress Nuclear Isotope Dose: 31.5 mCi
TID: 1.06

## 2022-08-01 MED ORDER — TECHNETIUM TC 99M TETROFOSMIN IV KIT
31.5000 | PACK | Freq: Once | INTRAVENOUS | Status: AC | PRN
  Administered 2022-08-01: 31.5 via INTRAVENOUS

## 2022-08-01 MED ORDER — REGADENOSON 0.4 MG/5ML IV SOLN
0.4000 mg | Freq: Once | INTRAVENOUS | Status: AC
Start: 2022-08-01 — End: 2022-08-01
  Administered 2022-08-01: 0.4 mg via INTRAVENOUS

## 2022-08-01 MED ORDER — TECHNETIUM TC 99M TETROFOSMIN IV KIT
10.8000 | PACK | Freq: Once | INTRAVENOUS | Status: AC | PRN
  Administered 2022-08-01: 10.8 via INTRAVENOUS

## 2022-08-19 ENCOUNTER — Telehealth: Payer: Self-pay | Admitting: Cardiology

## 2022-08-19 DIAGNOSIS — I25708 Atherosclerosis of coronary artery bypass graft(s), unspecified, with other forms of angina pectoris: Secondary | ICD-10-CM

## 2022-08-19 MED ORDER — LISINOPRIL 40 MG PO TABS: 40.0000 mg | ORAL_TABLET | Freq: Every day | ORAL | 3 refills | Status: DC

## 2022-08-19 MED ORDER — AMLODIPINE BESYLATE 10 MG PO TABS
10.0000 mg | ORAL_TABLET | Freq: Every day | ORAL | 3 refills | Status: DC
Start: 2022-08-19 — End: 2022-10-04

## 2022-08-19 NOTE — Telephone Encounter (Signed)
*  STAT* If patient is at the pharmacy, call can be transferred to refill team.   1. Which medications need to be refilled? (please list name of each medication and dose if known)   amLODipine (NORVASC) 10 MG tablet  lisinopril (ZESTRIL) 40 MG tablet   2. Which pharmacy/location (including street and city if local pharmacy) is medication to be sent to?  [id number] - [place], [place] - [address]   3. Do they need a 30 day or 90 day supply?   90 day   Patient stated he still has some of these medications

## 2022-08-19 NOTE — Telephone Encounter (Signed)
Refills has been sent to the pharmacy. 

## 2022-08-20 ENCOUNTER — Other Ambulatory Visit: Payer: Self-pay | Admitting: Cardiology

## 2022-08-20 NOTE — Telephone Encounter (Signed)
Prescription refill request for Xarelto received.  Indication:aflutter Last office visit:5/24 Weight:117.5  kg Age:72 Scr:1.42 6/24 CrCl:77  ml/min  Prescription refilled

## 2022-10-03 ENCOUNTER — Telehealth: Payer: Self-pay | Admitting: Cardiology

## 2022-10-03 NOTE — Telephone Encounter (Signed)
Pt c/o medication issue:  1. Name of Medication: amLODipine (NORVASC) 10 MG tablet   lisinopril (ZESTRIL) 40 MG tablet   2. How are you currently taking this medication (dosage and times per day)?   3. Are you having a reaction (difficulty breathing--STAT)? No  4. What is your medication issue? Pt wife would like a callback regarding pt only taking medications PRN do to recent surgery. She states that pt's BP has been running normal to low and would like a callback regarding this matter. Please advise

## 2022-10-03 NOTE — Telephone Encounter (Signed)
Returned call to pt. LVM to return call

## 2022-10-04 ENCOUNTER — Telehealth: Payer: Self-pay | Admitting: Cardiology

## 2022-10-04 NOTE — Telephone Encounter (Signed)
Call to wife and advised of change in medication.  She will take the BP before and after meds and keep a log.  She will call Tuesday with the readings unless concerns prior to that.  She will cut the 50 mg tablets in half for now and let us know if any issues  BP looks fine now. I would resume metoprolol at lower dose 25 mg bid and continue to monitor.  Peter [place] MD, [facility]

## 2022-10-04 NOTE — Telephone Encounter (Signed)
Patient wife ask regarding Lisinopril  and amlodipine.  He had femoral bypass.  BP has been very low.  She states he went to the [place] and they were concerned as his BP was low. He was advised to speak to cardiology since surgery has increased his circulation, his BP is too low.  States HR is still elevated.  7/27  130/77  124/79 7/28  117/80   HR 80    122/82  HR 61 7/29  127/79  HR  70    . 7/30  114/78  HR 67    1471/89  HR 79 (evening) 7/31 140/79   HR 100   128/81   HR 100 8/1   118/89  HR 86    Lisinopril and Amlodipine stopped from the  8th through the 12 th,  these were stopped at the hospital and told to give PRN. Patient then stopped the amlodipine completely and continued Lopressor until saw the [place] on Monday.   He then stopped the Lopressor on Monday    Please  advise if he should continue the Lopresor and with what parameters (per wife).

## 2022-10-04 NOTE — Telephone Encounter (Signed)
Follow Up:       Patient's wife is returning a call from today.

## 2022-10-04 NOTE — Telephone Encounter (Signed)
Returned call to pt's wife. Unable to LVM as it just cuts off.

## 2022-10-04 NOTE — Telephone Encounter (Signed)
Wife returned RN's call. 

## 2022-10-04 NOTE — Telephone Encounter (Signed)
Cal to patient, goes straight to VM.  LM to call office

## 2022-10-11 ENCOUNTER — Telehealth: Payer: Self-pay | Admitting: Cardiology

## 2022-10-11 NOTE — Telephone Encounter (Signed)
Left voicemail to return call to office.

## 2022-10-11 NOTE — Telephone Encounter (Signed)
Patient BP readings for the week.

## 2022-10-11 NOTE — Telephone Encounter (Signed)
Follow Up:     Calling back from his phone note on last Friday. He was supposed tpo call back with his blood pressure  readings.    10-08-22    before medicine  127/87 pulse 85   after medicine 140/95 pulse 94  10-09-22     before medicine   156/84 pulse 88   after medicine 130/82 pulse 81  10-10-22     before medicine 160/87 pulse 78      after medicine 148/102 pulse 68  10-11-22-     before medicine 153/86 pulse 84      after medicine  132/76 pulse 71     Pt c/o medication issue:  1. Name of Medication: Lisinopril and Lopressor  2. How are you currently taking this medication (dosage and times per day)?   3. Are you having a reaction (difficulty breathing--STAT)?   4. What is your medication issue? Been taking half the dose and not taking Amlodipine at all- Wife said they do not know what to do

## 2022-10-14 NOTE — Telephone Encounter (Signed)
Called patient left message on personal voice mail to call back. 

## 2022-10-15 NOTE — Telephone Encounter (Signed)
Left voicemail to return call to office to patient and alternate number

## 2022-10-17 NOTE — Telephone Encounter (Signed)
Left voicemail to return call to office.

## 2022-10-22 NOTE — Telephone Encounter (Signed)
Called patient left message on personal voice mail to call back. 

## 2022-10-28 NOTE — Telephone Encounter (Signed)
Patient never returned call  

## 2022-11-20 ENCOUNTER — Ambulatory Visit (HOSPITAL_BASED_OUTPATIENT_CLINIC_OR_DEPARTMENT_OTHER): Payer: Medicare Other | Attending: Cardiology | Admitting: Cardiology

## 2022-11-20 VITALS — Ht 70.0 in | Wt 250.0 lb

## 2022-11-20 DIAGNOSIS — I4891 Unspecified atrial fibrillation: Secondary | ICD-10-CM | POA: Diagnosis not present

## 2022-11-20 DIAGNOSIS — G4733 Obstructive sleep apnea (adult) (pediatric): Secondary | ICD-10-CM | POA: Diagnosis not present

## 2022-11-26 NOTE — Procedures (Signed)
Patient Name: William [name], William [name] Date: 11/20/2022 Gender: Male D.O.B: March 04, 1951 Age (years): 36 Referring Provider: Armanda Magic MD, ABSM Height (inches): 70 Interpreting Physician: Armanda Magic MD, ABSM Weight (lbs): 250 RPSGT: Armen Pickup BMI: 36 MRN: [id number] Neck Size: 17.00  CLINICAL INFORMATION The patient is referred for a CPAP titration to treat sleep apnea.  SLEEP STUDY TECHNIQUE As per the AASM Manual for the Scoring of Sleep and Associated Events v2.3 (April 2016) with a hypopnea requiring 4% desaturations.  The channels recorded and monitored were frontal, central and occipital EEG, electrooculogram (EOG), submentalis EMG (chin), nasal and oral airflow, thoracic and abdominal wall motion, anterior tibialis EMG, snore microphone, electrocardiogram, and pulse oximetry. Continuous positive airway pressure (CPAP) was initiated at the beginning of the study and titrated to treat sleep-disordered breathing.  MEDICATIONS Medications self-administered by patient taken the night of the study : N/A  TECHNICIAN COMMENTS Comments added by technician: Pt went to restroom twice Comments added by scorer: N/A  RESPIRATORY PARAMETERS Optimal PAP Pressure (cm):15  AHI at Optimal Pressure (/hr):0 Overall Minimal O2 (%):85.0  Supine % at Optimal Pressure (%):0 Minimal O2 at Optimal Pressure (%): 85.0   SLEEP ARCHITECTURE The study was initiated at 10:00:16 PM and ended at 3:55:59 AM.  Sleep onset time was 24.4 minutes and the sleep efficiency was 78.4%. The total sleep time was 279 minutes.  The patient spent 6.1% of the night in stage N1 sleep, 71.9% in stage N2 sleep, 0.2% in stage N3 and 21.9% in REM.Stage REM latency was 49.5 minutes  Wake after sleep onset was 52.3. Alpha intrusion was absent. Supine sleep was 0.00%.  CARDIAC DATA The 2 lead EKG demonstrated Atrial Fibrillation. The mean heart rate was 67.5 beats per minute. Other EKG findings include:  None.  LEG MOVEMENT DATA The total Periodic Limb Movements of Sleep (PLMS) were 0. The PLMS index was 0.0. A PLMS index of <15 is considered normal in adults.  IMPRESSIONS - The optimal PAP pressure was 15 cm of water. - Moderate oxygen desaturations were observed during this titration (min O2 = 85.0%). - No snoring was audible during this study. - Atrial Fibrillation was observed during this study. - Clinically significant periodic limb movements were not noted during this study. Arousals associated with PLMs were rare.  DIAGNOSIS - Obstructive Sleep Apnea (G47.33) - Atrial Fibrillation  RECOMMENDATIONS - Trial of ResMed CPAP therapy on 15 cm H2O with a Medium size Resmed Full Face AirFit F10 mask and heated humidification. - Avoid alcohol, sedatives and other CNS depressants that may worsen sleep apnea and disrupt normal sleep architecture. - Sleep hygiene should be reviewed to assess factors that may improve sleep quality. - Weight management and regular exercise should be initiated or continued. - Return to Sleep Center for re-evaluation after 4 weeks of therapy  [Electronically signed] 11/26/2022 09:54 AM  Armanda Magic MD, ABSM Diplomate, American Board of Sleep Medicine

## 2022-12-03 ENCOUNTER — Telehealth: Payer: Self-pay | Admitting: *Deleted

## 2022-12-03 DIAGNOSIS — I25708 Atherosclerosis of coronary artery bypass graft(s), unspecified, with other forms of angina pectoris: Secondary | ICD-10-CM

## 2022-12-03 DIAGNOSIS — G4733 Obstructive sleep apnea (adult) (pediatric): Secondary | ICD-10-CM

## 2022-12-03 NOTE — Telephone Encounter (Signed)
The patient has been notified of the result. Left detailed message on voicemail and informed patient to call back..Syniyah Bourne Green, CMA   

## 2022-12-03 NOTE — Telephone Encounter (Addendum)
The patient has been notified of the result and verbalized understanding.  All questions (if any) were answered. Latrelle Dodrill, CMA 12/03/2022 11:08 AM    Upon patient request DME selection is [facility]. Patient understands he will be contacted by [facility] to set up his cpap. Patient understands to call if [facility] does not contact him with new setup in a timely manner. Patient understands they will be called once confirmation has been received from [facility]/ that they have received their new machine to schedule 10 week follow up appointment.   [facility] notified of new cpap order  Please add to airview Patient was grateful for the call and thanked me.

## 2022-12-03 NOTE — Telephone Encounter (Signed)
-----   Message from Armanda Magic sent at 11/26/2022  9:56 AM EDT ----- Please let patient know that they had a successful PAP titration and let DME know that orders are in EPIC.  Please set up 6 week OV with me. Plese get an ONO on CPAP

## 2022-12-27 ENCOUNTER — Telehealth: Payer: Self-pay | Admitting: Cardiology

## 2022-12-27 DIAGNOSIS — I25708 Atherosclerosis of coronary artery bypass graft(s), unspecified, with other forms of angina pectoris: Secondary | ICD-10-CM

## 2022-12-27 MED ORDER — NITROGLYCERIN 0.4 MG SL SUBL: 0.4000 mg | SUBLINGUAL_TABLET | SUBLINGUAL | 1 refills | Status: DC | PRN

## 2022-12-27 NOTE — Telephone Encounter (Signed)
Pt's medication was sent to pt's pharmacy as requested. Confirmation received.  °

## 2022-12-27 NOTE — Telephone Encounter (Signed)
*  STAT* If patient is at the pharmacy, call can be transferred to refill team.   1. Which medications need to be refilled? (please list name of each medication and dose if known)   Disp Refills Start End   nitroGLYCERIN (NITROSTAT) 0.4 MG SL tablet        2. Which pharmacy/location (including street and city if local pharmacy) is medication to be sent to? [id number] - [place], [place] - [address] Phone: [phone]  Fax: [phone]      3. Do they need a 30 day or 90 day supply? 90

## 2023-01-08 NOTE — Progress Notes (Signed)
Cardiology Office Note    Date:  01/15/2023   ID:  William [name], DOB 01/26/1951, MRN [id number]  PCP:  Vivien Presto, MD  Cardiologist:  Dr. Jabre Heo [place]     History of Present Illness: William [name] is a 72 y.o. male seen for follow up CAD and atrial flutter. He has a history of CAD, s/p CABG in 03/1999 (LIMA-LAD, RIMA-OM2, SVG-OM1/OM3, SVG-AM of the RCA), HTN, HL, atrial flutter, PMR.   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  He converted to NSR on his own and Eliquis was d/c'd.  He was seen at the [facility] clinic in November 2016 and was noted to have an irregular pulse. He was  in atrial flutter. He denied any symptoms. He was  started on Xarelto. Metoprolol dose was increased for rate control. Echo showed mild LVH with normal systolic function. EF 55-60%. Mild RAE.   He underwent DCCV in Jan 2017. On follow up in Feb. He was back in atrial flutter. He subsequently underwent ablation of atrial flutter by Dr. Elberta Fortis on 06/15/15. No complications. His anticoagulation was stopped one month later. When he was seen  in May 2017 he was in NSR.  A Holter was done at the [place] later in May 2017 and apparently showed  paroxysmal atrial fibrillation and flutter. Burden 4%.  [place] vasc score of 3. It was recommended that he resume Xarelto.   He was admitted at [facility] [place] in January 2022 with right PNA and AKI. Echo at that time was OK. Apparently had a hematoma of the right flank as well. Xarelto was held but later resumed. He is off methotrexate.   He was readmitted in May 2022 with rib fractures related to coughing and osteoporosis.  When seen by Dr Salvadore Farber in August his pulse was irregular but controlled. He still has  persistent limiting back pain and neuropathy.  Now seeing Dr Verdie Mosher at [facility]. Had a couple of injections and is being considered for ablative therapy. He is followed by Dr Laural Benes at [facility] vascular for AAA and also PAD- LE dopplers indicated  right SFA and AT disease. Medical therapy recommended.  Now has a motorized chair or uses a walker.  He did have Itmar sleep study demonstrating severe OSA. Auto CPAP titration recommended but he has not had this fitted yet.  Was seen by vascular surgery.  CT showed AAA stable at 4.3 cm. Severe stenosis of right profunda and SFA. He had right femoral endarterectomy and SFA angioplasty. Had right to left fem- fem bypass in July.  Myoview study in May showed normal perfusion with mildly reduced EF 45%.   He reports that since his surgery his leg pain has resolved. Still has back and foot pain. Has intermittent back injections at [facility] Is now on CPAP for severe sleep apnea. Due for follow up with Dr Mayford Knife. No chest pain. Also scheduled for follow up imaging for AAA at [facility].   Recent Labs: 07/05/2022: ALT 28; Creatinine, Ser 0.83; HDL 36; LDL Chol Calc (NIH) 74; Potassium 5.1  Wt Readings from Last 3 Encounters:  01/15/23 269 lb 3.2 oz (122.1 kg)  11/20/22 250 lb (113.4 kg)  08/01/22 259 lb (117.5 kg)     Past Medical History:  Diagnosis Date   AAA (abdominal aortic aneurysm) without rupture (HCC) 08/22/2015   Abdominal mass, right lower quadrant, fatty 08/22/2015   Atrial flutter (HCC) 09/19/2012   CMV (cytomegalovirus infection) (HCC)  Coronary disease 2001   CABG   Cytomegalovirus mononucleosis 09/17/2012   Hypercholesterolemia    Hypertension    Insomnia    Myocardial infarction (HCC)    Pneumonia    Polymyalgia rheumatica (HCC)    Tobacco abuse     Current Outpatient Medications  Medication Sig Dispense Refill   ACETAMINOPHEN ER PO Take 500 mg by mouth every 8 (eight) hours as needed for pain. Take 2 tablets as needed     Adalimumab (HUMIRA PEN) 40 MG/0.4ML PNKT ADMINISTER 1 INJECTION UNDER THE SKIN EVERY OTHER WEEK     albuterol (PROVENTIL HFA;VENTOLIN HFA) 108 (90 BASE) MCG/ACT inhaler Inhale 1-2 puffs into the lungs every 6 (six) hours as needed for wheezing or shortness of  breath. 1 Inhaler 0   atorvastatin (LIPITOR) 80 MG tablet Take 1 tablet (80 mg total) by mouth daily. 90 tablet 3   ezetimibe (ZETIA) 10 MG tablet Take 1 tablet (10 mg total) by mouth daily. 90 tablet 3   lisinopril (ZESTRIL) 40 MG tablet Take 1 tablet (40 mg total) by mouth daily. 90 tablet 3   magnesium gluconate (MAGONATE) 500 MG tablet Take 500 mg by mouth every evening.      Multiple Vitamin (MULTIVITAMIN WITH MINERALS) TABS tablet Take 1 tablet by mouth daily.     nitroGLYCERIN (NITROSTAT) 0.4 MG SL tablet Place 1 tablet (0.4 mg total) under the tongue every 5 (five) minutes as needed for chest pain. 75 tablet 1   Omega-3 Fatty Acids (FISH OIL) 500 MG CAPS Take 2 capsules by mouth 2 (two) times daily.     Saccharomyces boulardii (PROBIOTIC) 250 MG CAPS Take by mouth. 60 capsule    sildenafil (VIAGRA) 100 MG tablet Take 100 mg by mouth as needed.     traMADol (ULTRAM) 50 MG tablet Take 50 mg by mouth every 6 (six) hours as needed for moderate pain or severe pain. 30 tablet    vitamin C (ASCORBIC ACID) 250 MG tablet Take 500 mg daily     XARELTO 20 MG TABS tablet TAKE 1 TABLET DAILY WITH SUPPER 90 tablet 3   metoprolol tartrate (LOPRESSOR) 50 MG tablet Take 1 tablet (50 mg total) by mouth 2 (two) times daily. 180 tablet 3   No current facility-administered medications for this visit.    Allergies:   Methotrexate, Oxycodone, Iodinated contrast media, Iodine, and Zocor [simvastatin]   Social History:  The patient  reports that he quit smoking about 17 months ago. His smoking use included cigars. He has never used smokeless tobacco. He reports current alcohol use of about 1.0 standard drink of alcohol per week. He reports that he does not use drugs.   Family History:  The patient's family history includes Bladder Cancer in his father; Colon cancer in his mother; Heart failure in his father; Pancreatic cancer in his mother.   ROS:  Please see the history of present illness.   All other  systems reviewed and negative.   PHYSICAL EXAM: VS:  BP 118/78   Pulse 66   Ht 5\' 10"  (1.778 m)   Wt 269 lb 3.2 oz (122.1 kg)   SpO2 96%   BMI 38.63 kg/m  GENERAL:  Well appearing morbidly obese WM in NAD HEENT:  PERRL, EOMI, sclera are clear. Oropharynx is clear. NECK:  No jugular venous distention, carotid upstroke brisk and symmetric, no bruits, no thyromegaly or adenopathy LUNGS:  clear  CHEST:  Unremarkable HEART:  IRRR,  PMI not displaced or sustained,S1 and  S2 within normal limits, no S3, no S4: no clicks, no rubs, no murmurs ABD:  Soft, nontender. BS +, no masses or bruits. No hepatomegaly, no splenomegaly EXT:  1+ RLE pulse. 2+ on left  no edema, no cyanosis no clubbing SKIN:  Warm and dry.  No rashes NEURO:  Alert and oriented x 3. Cranial nerves II through XII intact. PSYCH:  Cognitively intact       Lab Results  Component Value Date   WBC 3.7 (L) 08/21/2015   HGB 15.8 08/21/2015   HCT 47.6 08/21/2015   PLT 70 (L) 08/21/2015   GLUCOSE 95 07/05/2022   CHOL 130 07/05/2022   TRIG 109 07/05/2022   HDL 36 (L) 07/05/2022   LDLCALC 74 07/05/2022   ALT 28 07/05/2022   AST 22 07/05/2022   NA 139 07/05/2022   K 5.1 07/05/2022   CL 104 07/05/2022   CREATININE 0.83 07/05/2022   BUN 18 07/05/2022   CO2 22 07/05/2022   TSH 0.951 09/22/2012   INR 2.03 (H) 03/23/2015   Dated 02/16/16: Normal CMET, glucose 106. Cholesterol 187, triglycerides 85, HDL 47, LDL 123.  Dated 07/25/16: Normal Hgb, creatinine, ALT.  Dated 02/07/17: WBC 9.9K, otherwise CBC normal. A1c 5.6%. CMET normal. Cholesterol 145, triglycerides 188, HDL 45, LDL 62.  Dated 11/13/17: cholesterol 134, triglycerides 174, HDL 36, LDL 63. TSH normal Dated 01/23/18: BMET and CBC normal. Labs from the [place] 02/10/18: Normal CBC and CMET. Cholesterol 154, triglycerides 226, HDL 41, LDL 68.  Dated 05/08/20: normal CBC and CMET Dated 06/09/19; cholesterol 106, triglycerides 132, HLD 30, LDL 52.  Dated 12/29/20: CBC, BMET  and LFTs normal Dated 06/29/21: normal CBC and CMET Dated 01/01/22: normal CMET Dated 05/09/22: normal BMET and CBC.   Ecg not done today   Myoview 08/08/15:Study Highlights   Nuclear stress EF: 54%. Visually, the LV function appears to be greater than the computer generated 54%. Normal LV functino There was no ST segment deviation noted during stress. The study is normal. there is no ischemia or infarction This is a low risk study   Echo 04/03/20: Impression  Left Ventricle: EF: 60-65%.  Narrative  This result has an attachment that is not available.    Left Ventricle  Normal left ventricular size. There is mild hypertrophy. EF: 60-65%. Wall motion is normal. Doppler parameters indicate normal diastolic function.   Right Ventricle  Right ventricle is normal. Systolic function is normal.   Left Atrium  Left atrium is mildly dilated.   Right Atrium  Right atrium is mildly dilated.   Mitral Valve  There is moderate annular calcification. There is trace regurgitation.   Tricuspid Valve  Tricuspid valve structure is normal. There is trace regurgitation. The right ventricular systolic pressure is normal (<36 mmHg).   Aortic Valve  The aortic valve is tricuspid. The leaflets exhibit normal excursion. There is moderate sclerosis. There is no regurgitation or stenosis.   Pulmonic Valve  The pulmonic valve was not well visualized. Trace regurgitation.   Ascending Aorta  The aortic root is normal in size.   Pericardium  There is no pericardial effusion.   Study Details  A complete echo was performed using complete 2D, color flow Doppler and spectral Doppler. Overall the study quality was adequate. The study wastechnically difficult.   Echo 10/08/21: INTERPRETATION ---------------------------------------------------------------    NORMAL LEFT VENTRICULAR SYSTOLIC FUNCTION WITH MILD LVH    NORMAL RIGHT VENTRICULAR SYSTOLIC FUNCTION    VALVULAR REGURGITATION: TRIVIAL MR, MILD  PR,  MILD TR    NO VALVULAR STENOSIS    POOR SOUND TRANSMISSION; DEFINITY CONTRAST USED.    IRREGULAR RHYTHM THROUGHOUT EXAM.   Myoview 08/01/22: Study Highlights      The study is normal. The study is low risk.  Atrial fibrillation present.   No ST deviation was noted.   LV perfusion is normal. There is no evidence of ischemia. There is no evidence of infarction.   Left ventricular function is abnormal. Global function is mildly reduced. Nuclear stress EF: 45 %. The left ventricular ejection fraction is mildly decreased (45-54%). End diastolic cavity size is normal.  There is post CABG septal wall hypokinesis     ASSESSMENT AND PLAN:  CAD:  S/p CABG in 2001. Normal perfusion by Myoview in May.  He is asymptomatic. Continue medical therapy Atrial fibrillation/Flutter:   s/p atrial flutter ablation April 2017.  Recurrent paroxysmal atrial fib/ flutter noted on monitor at [facility] in 2017 with low burden. Recurrent now and persistent.  He is completely asymptomatic. On Xarelto. Rate is well controlled. At this point would continue rate control strategy Hypertension:  Controlled. Hyperlipidemia:  LDL 74 on maximal lipitor dose. Given extensive vascular history would like to see LDL < 55. Will add Zetia 10 mg daily. Repeat fasting lab 3 months.  S/p lumbar fusion with persistent limiting back pain and neuropathy.  Now seeing Dr Verdie Mosher at [facility]. PAD with right SFA and profunda stenosis. S/p fem fem bypass. Improved leg pain.  AAA 4.3 cm. Also followed at [facility].  Severe  sleep apnea. Now on CPAP.  follow up with sleep medicine.  Follow up in 6 months.   Signed, Shoshana Johal [place] MD, [facility]    01/15/2023 9:21 AM

## 2023-01-15 ENCOUNTER — Ambulatory Visit: Payer: Medicare Other | Attending: Cardiology | Admitting: Cardiology

## 2023-01-15 ENCOUNTER — Encounter: Payer: Self-pay | Admitting: Cardiology

## 2023-01-15 VITALS — BP 118/78 | HR 66 | Ht 70.0 in | Wt 269.2 lb

## 2023-01-15 DIAGNOSIS — I4892 Unspecified atrial flutter: Secondary | ICD-10-CM | POA: Diagnosis present

## 2023-01-15 DIAGNOSIS — I25708 Atherosclerosis of coronary artery bypass graft(s), unspecified, with other forms of angina pectoris: Secondary | ICD-10-CM | POA: Diagnosis not present

## 2023-01-15 DIAGNOSIS — I440 Atrioventricular block, first degree: Secondary | ICD-10-CM | POA: Insufficient documentation

## 2023-01-15 DIAGNOSIS — I10 Essential (primary) hypertension: Secondary | ICD-10-CM | POA: Insufficient documentation

## 2023-01-15 MED ORDER — EZETIMIBE 10 MG PO TABS: 10.0000 mg | ORAL_TABLET | Freq: Every day | ORAL | 3 refills | Status: DC

## 2023-01-15 NOTE — Patient Instructions (Signed)
Medication Instructions:  Start zetia 10 mg daily  Continue all other medications  *If you need a refill on your cardiac medications before your next appointment, please call your pharmacy*   Lab Work: 3 months fasting lab work   If you have labs (blood work) drawn today and your tests are completely normal, you will receive your results only by: MyChart Message (if you have MyChart) OR A paper copy in the mail If you have any lab test that is abnormal or we need to change your treatment, we will call you to review the results.   Testing/Procedures: None    Follow-Up: At [facility], you and your health needs are our priority.  As part of our continuing mission to provide you with exceptional heart care, we have created designated Provider Care Teams.  These Care Teams include your primary Cardiologist (physician) and Advanced Practice Providers (APPs -  Physician Assistants and Nurse Practitioners) who all work together to provide you with the care you need, when you need it.  We recommend signing up for the patient portal called "MyChart".  Sign up information is provided on this After Visit Summary.  MyChart is used to connect with patients for Virtual Visits (Telemedicine).  Patients are able to view lab/test results, encounter notes, upcoming appointments, etc.  Non-urgent messages can be sent to your provider as well.   To learn more about what you can do with MyChart, go to [email or web address].    Your next appointment:   6 month(s)  Provider:   Dr. [place]

## 2023-01-27 ENCOUNTER — Telehealth: Payer: Self-pay

## 2023-01-27 NOTE — Telephone Encounter (Signed)
-----   Message from Armanda Magic sent at 01/24/2023  4:05 PM EST ----- No nocturnal hypoxemia - please find out if this was done on CPAP

## 2023-01-27 NOTE — Telephone Encounter (Signed)
Call to patient, no answer and no DPR visible in epic. Left message with no identifiers asking recipient to call [place] at our office #.

## 2023-02-07 ENCOUNTER — Telehealth: Payer: Self-pay

## 2023-02-07 NOTE — Telephone Encounter (Signed)
-----   Message from Armanda Magic sent at 01/24/2023  4:05 PM EST ----- No nocturnal hypoxemia - please find out if this was done on CPAP

## 2023-02-07 NOTE — Telephone Encounter (Signed)
Call to patient to discuss study results, no answer, no DPR on file. Left message with no identifiers asking recipient to call [place] back at our office #.

## 2023-03-11 ENCOUNTER — Telehealth: Payer: Medicare Other | Admitting: Cardiology

## 2023-03-20 ENCOUNTER — Telehealth: Payer: Self-pay | Admitting: Cardiology

## 2023-03-20 MED ORDER — ATORVASTATIN CALCIUM 80 MG PO TABS: 80.0000 mg | ORAL_TABLET | Freq: Every day | ORAL | 3 refills | Status: DC

## 2023-03-20 NOTE — Telephone Encounter (Signed)
Pt's medication was sent to pt's pharmacy as requested. Confirmation received.  °

## 2023-03-20 NOTE — Telephone Encounter (Signed)
*  STAT* If patient is at the pharmacy, call can be transferred to refill team.   1. Which medications need to be refilled? (please list name of each medication and dose if known)   atorvastatin (LIPITOR) 80 MG tablet    Take 1 tablet (80 mg total) by mouth daily.     2. Would you like to learn more about the convenience, safety, & potential cost savings by using the [facility]? No   3. Are you open to using the [facility] No   4. Which pharmacy/location (including street and city if local pharmacy) is medication to be sent to? [id number] - [place], [place] - [address]    5. Do they need a 30 day or 90 day supply? 90 Day Supply

## 2023-05-07 ENCOUNTER — Telehealth: Payer: Self-pay | Admitting: Cardiology

## 2023-05-07 NOTE — Telephone Encounter (Signed)
 Called patient. LMTCB on 05/07/2023.

## 2023-05-07 NOTE — Telephone Encounter (Signed)
 Patient stated he had lab work done at [facility] at [place] on 2/25 and wants a call back to confirm Dr. [place] can see the lab results.

## 2023-05-08 NOTE — Telephone Encounter (Signed)
 FWD to Dr. [place]  Patient requesting review of recent labs completed on 2/25  Results available in care everywhere under "labs"

## 2023-05-09 NOTE — Telephone Encounter (Signed)
 [place], Peter M, MD  You; Charna [place], [place] hours ago (6:28 PM)    Yes I saw them. Looked good  Peter [place] MD, [facility]   Attempted to call patient, no answer mailbox full, unable to leave message.

## 2023-05-12 NOTE — Telephone Encounter (Signed)
 2nd attempt to call patient, no answer voicemail full unable to leave message.

## 2023-05-13 NOTE — Telephone Encounter (Signed)
 3rd attempt to call patient, no answer, Mailbox full, unable to leave message Nursing will await for patient to return call

## 2023-05-15 NOTE — Telephone Encounter (Signed)
 Called patient no answer.Voice mail full. Called patient's wife William [name] left Dr.Jordan's advice on her personal voice mail.Advised patient needs to call back and schedule May follow up appointment.

## 2023-05-17 ENCOUNTER — Other Ambulatory Visit: Payer: Self-pay | Admitting: Cardiology

## 2023-05-17 DIAGNOSIS — I25708 Atherosclerosis of coronary artery bypass graft(s), unspecified, with other forms of angina pectoris: Secondary | ICD-10-CM

## 2023-06-20 ENCOUNTER — Other Ambulatory Visit: Payer: Self-pay | Admitting: Cardiology

## 2023-07-10 ENCOUNTER — Telehealth: Payer: Self-pay | Admitting: Cardiology

## 2023-07-10 NOTE — Telephone Encounter (Signed)
 Patient says for insurance coverage [employer] needs documentation confirming that he is still under Dr. Christophe Cram care and still requires a CPAP.

## 2023-07-10 NOTE — Telephone Encounter (Signed)
 Spoke with patient and he states he would like to know if he still needs to continue taking both plavix and xarelto 

## 2023-07-10 NOTE — Telephone Encounter (Signed)
 Spoke to patient Dr.Jordan's advice given.Appointment scheduled with Dr.Jordan 5/14 at 4:20 pm.

## 2023-07-10 NOTE — Telephone Encounter (Signed)
 Pt c/o medication issue:  1. Name of Medication:  XARELTO  20 MG TABS tablet  2. How are you currently taking this medication (dosage and times per day)?   3. Are you having a reaction (difficulty breathing--STAT)?   4. What is your medication issue?   Patient says he has been put on Plavix and would like to know if he needs to continue taking Xarelto . He would also like to know if Dr. [place] recommends another name brand blood thinner than is less expensive. Please advise.

## 2023-07-12 NOTE — Progress Notes (Addendum)
 Cardiology Office Note    Date:  07/16/2023   ID:  RANDE ROYLANCE, DOB 20-Jun-1950, MRN [id number]  PCP:  Chandler Combs, MD  Cardiologist:  Dr. Marin Milley [place]     History of Present Illness: YAZID POP is a 73 y.o. male seen for follow up CAD and atrial flutter. He has a history of CAD, s/p CABG in 03/1999 (LIMA-LAD, RIMA-OM2, SVG-OM1/OM3, SVG-AM of the RCA), HTN, HL, atrial flutter, PMR.   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  He converted to NSR on his own and Eliquis  was d/c'd.  He was seen at the [facility] clinic in November 2016 and was noted to have an irregular pulse. He was  in atrial flutter. He denied any symptoms. He was  started on Xarelto . Metoprolol  dose was increased for rate control. Echo showed mild LVH with normal systolic function. EF 55-60%. Mild RAE.   He underwent DCCV in Jan 2017. On follow up in Feb. He was back in atrial flutter. He subsequently underwent ablation of atrial flutter by Dr. Lawana Pray on 06/15/15. No complications. His anticoagulation was stopped one month later. When he was seen  in May 2017 he was in NSR.  A Holter was done at the [place] later in May 2017 and apparently showed  paroxysmal atrial fibrillation and flutter. Burden 4%.  [place] vasc score of 3. It was recommended that he resume Xarelto .   He was admitted at [facility] in January 2022 with right PNA and AKI. Echo at that time was OK. Apparently had a hematoma of the right flank as well. Xarelto  was held but later resumed. He is off methotrexate .   He was readmitted in May 2022 with rib fractures related to coughing and osteoporosis.  When seen by Dr Reola Casino in August his pulse was irregular but controlled. He still has  persistent limiting back pain and neuropathy.  Now seeing Dr Jolene Nearing at [facility]. Had a couple of injections and is being considered for ablative therapy. He is followed by Dr Lincoln Renshaw at [facility] vascular for AAA and also PAD- LE dopplers indicated  right SFA and AT disease. Medical therapy recommended.  Now has a motorized chair or uses a walker.  He did have Itmar sleep study demonstrating severe OSA. Auto CPAP titration recommended but he has not had this fitted yet.  Was seen by vascular surgery.  CT showed AAA stable at 4.3 cm. Severe stenosis of right profunda and SFA. He had right femoral endarterectomy and SFA angioplasty. Had right to left fem- fem bypass in July.  Myoview  study in May 2024 showed normal perfusion with mildly reduced EF 45%.   On follow up today he is doing well. Is now on CPAP for severe sleep apnea and states this has made a huge difference.  Uses nightly. No chest pain. Also scheduled for follow up imaging for AAA at [facility]. Last US  was 4.7 cm. No significant claudication   Recent Labs: No results found for requested labs within last 365 days.  Wt Readings from Last 3 Encounters:  07/16/23 268 lb (121.6 kg)  01/15/23 269 lb 3.2 oz (122.1 kg)  11/20/22 250 lb (113.4 kg)     Past Medical History:  Diagnosis Date   AAA (abdominal aortic aneurysm) without rupture (HCC) 08/22/2015   Abdominal mass, right lower quadrant, fatty 08/22/2015   Atrial flutter (HCC) 09/19/2012   CMV (cytomegalovirus infection) (HCC)    Coronary disease 2001  CABG   Cytomegalovirus mononucleosis 09/17/2012   Hypercholesterolemia    Hypertension    Insomnia    Myocardial infarction (HCC)    Pneumonia    Polymyalgia rheumatica (HCC)    Tobacco abuse     Current Outpatient Medications  Medication Sig Dispense Refill   ACETAMINOPHEN  ER PO Take 500 mg by mouth every 8 (eight) hours as needed for pain. Take 2 tablets as needed     Adalimumab (HUMIRA PEN) 40 MG/0.4ML PNKT ADMINISTER 1 INJECTION UNDER THE SKIN EVERY OTHER WEEK     albuterol  (PROVENTIL  HFA;VENTOLIN  HFA) 108 (90 BASE) MCG/ACT inhaler Inhale 1-2 puffs into the lungs every 6 (six) hours as needed for wheezing or shortness of breath. 1 Inhaler 0   amLODipine  (NORVASC ) 10  MG tablet Take 10 mg by mouth daily.     atorvastatin  (LIPITOR) 80 MG tablet Take 1 tablet (80 mg total) by mouth daily. 90 tablet 3   clopidogrel (PLAVIX) 75 MG tablet Take 75 mg by mouth daily.     denosumab (PROLIA) 60 MG/ML SOSY injection Inject 60 mg into the skin every 6 (six) months.     ezetimibe  (ZETIA ) 10 MG tablet Take 1 tablet (10 mg total) by mouth daily. 90 tablet 3   lisinopril  (ZESTRIL ) 40 MG tablet TAKE 1 TABLET(40 MG) BY MOUTH DAILY 90 tablet 0   magnesium  gluconate (MAGONATE) 500 MG tablet Take 500 mg by mouth every evening.      magnesium  oxide (MAG-OX) 400 MG tablet Take 400 mg by mouth daily.     metoprolol  tartrate (LOPRESSOR ) 50 MG tablet Take 1 tablet (50 mg total) by mouth 2 (two) times daily. 180 tablet 3   Multiple Vitamin (MULTIVITAMIN WITH MINERALS) TABS tablet Take 1 tablet by mouth daily.     nitroGLYCERIN  (NITROSTAT ) 0.4 MG SL tablet Place 1 tablet (0.4 mg total) under the tongue every 5 (five) minutes as needed for chest pain. 75 tablet 1   Omega-3 Fatty Acids (FISH OIL) 500 MG CAPS Take 2 capsules by mouth 2 (two) times daily.     Saccharomyces boulardii (PROBIOTIC) 250 MG CAPS Take by mouth. 60 capsule    sildenafil (VIAGRA) 100 MG tablet Take 100 mg by mouth as needed.     traMADol  (ULTRAM ) 50 MG tablet Take 50 mg by mouth every 6 (six) hours as needed for moderate pain or severe pain. 30 tablet    vitamin C (ASCORBIC ACID) 250 MG tablet Take 500 mg daily     XARELTO  20 MG TABS tablet TAKE 1 TABLET DAILY WITH SUPPER 90 tablet 3   [START ON 07/25/2023] ZEPBOUND 7.5 MG/0.5ML Pen Inject 7.5 mg into the skin once a week.     No current facility-administered medications for this visit.    Allergies:   Methotrexate , Oxycodone , Iodinated contrast media, Iodine, and Zocor [simvastatin]   Social History:  The patient  reports that he quit smoking about 1 years ago. His smoking use included cigars. He has never used smokeless tobacco. He reports current alcohol use  of about 1.0 standard drink of alcohol per week. He reports that he does not use drugs.   Family History:  The patient's family history includes Bladder Cancer in his father; Colon cancer in his mother; Heart failure in his father; Pancreatic cancer in his mother.   ROS:  Please see the history of present illness.   All other systems reviewed and negative.   PHYSICAL EXAM: VS:  BP 110/78 (Cuff Size: Normal)  Pulse 74   Ht 5\' 10"  (1.778 m)   Wt 268 lb (121.6 kg)   SpO2 96%   BMI 38.45 kg/m  GENERAL:  Well appearing morbidly obese WM in NAD HEENT:  PERRL, EOMI, sclera are clear. Oropharynx is clear. NECK:  No jugular venous distention, carotid upstroke brisk and symmetric, no bruits, no thyromegaly or adenopathy LUNGS:  clear  CHEST:  Unremarkable HEART:  IRRR,  PMI not displaced or sustained,S1 and S2 within normal limits, no S3, no S4: no clicks, no rubs, no murmurs ABD:  Soft, nontender. BS +, no masses or bruits. No hepatomegaly, no splenomegaly EXT:  1+ RLE pulse. 2+ on left  no edema, no cyanosis no clubbing SKIN:  Warm and dry.  No rashes NEURO:  Alert and oriented x 3. Cranial nerves II through XII intact. PSYCH:  Cognitively intact       Lab Results  Component Value Date   WBC 3.7 (L) 08/21/2015   HGB 15.8 08/21/2015   HCT 47.6 08/21/2015   PLT 70 (L) 08/21/2015   GLUCOSE 95 07/05/2022   CHOL 130 07/05/2022   TRIG 109 07/05/2022   HDL 36 (L) 07/05/2022   LDLCALC 74 07/05/2022   ALT 28 07/05/2022   AST 22 07/05/2022   NA 139 07/05/2022   K 5.1 07/05/2022   CL 104 07/05/2022   CREATININE 0.83 07/05/2022   BUN 18 07/05/2022   CO2 22 07/05/2022   TSH 0.951 09/22/2012   INR 2.03 (H) 03/23/2015   Dated 02/16/16: Normal CMET, glucose 106. Cholesterol 187, triglycerides 85, HDL 47, LDL 123.  Dated 07/25/16: Normal Hgb, creatinine, ALT.  Dated 02/07/17: WBC 9.9K, otherwise CBC normal. A1c 5.6%. CMET normal. Cholesterol 145, triglycerides 188, HDL 45, LDL 62.   Dated 11/13/17: cholesterol 134, triglycerides 174, HDL 36, LDL 63. TSH normal Dated 01/23/18: BMET and CBC normal. Labs from the [place] 02/10/18: Normal CBC and CMET. Cholesterol 154, triglycerides 226, HDL 41, LDL 68.  Dated 05/08/20: normal CBC and CMET Dated 06/09/19; cholesterol 106, triglycerides 132, HLD 30, LDL 52.  Dated 12/29/20: CBC, BMET and LFTs normal Dated 06/29/21: normal CBC and CMET Dated 01/01/22: normal CMET Dated 05/09/22: normal BMET and CBC.   Ecg not done today   Myoview  08/08/15:Study Highlights   Nuclear stress EF: 54%. Visually, the LV function appears to be greater than the computer generated 54%. Normal LV functino There was no ST segment deviation noted during stress. The study is normal. there is no ischemia or infarction This is a low risk study   Echo 04/03/20: Impression  Left Ventricle: EF: 60-65%.  Narrative  This result has an attachment that is not available.    Left Ventricle  Normal left ventricular size. There is mild hypertrophy. EF: 60-65%. Wall motion is normal. Doppler parameters indicate normal diastolic function.   Right Ventricle  Right ventricle is normal. Systolic function is normal.   Left Atrium  Left atrium is mildly dilated.   Right Atrium  Right atrium is mildly dilated.   Mitral Valve  There is moderate annular calcification. There is trace regurgitation.   Tricuspid Valve  Tricuspid valve structure is normal. There is trace regurgitation. The right ventricular systolic pressure is normal (<36 mmHg).   Aortic Valve  The aortic valve is tricuspid. The leaflets exhibit normal excursion. There is moderate sclerosis. There is no regurgitation or stenosis.   Pulmonic Valve  The pulmonic valve was not well visualized. Trace regurgitation.   Ascending Aorta  The  aortic root is normal in size.   Pericardium  There is no pericardial effusion.   Study Details  A complete echo was performed using complete 2D, color flow Doppler  and spectral Doppler. Overall the study quality was adequate. The study wastechnically difficult.   Echo 10/08/21: INTERPRETATION ---------------------------------------------------------------    NORMAL LEFT VENTRICULAR SYSTOLIC FUNCTION WITH MILD LVH    NORMAL RIGHT VENTRICULAR SYSTOLIC FUNCTION    VALVULAR REGURGITATION: TRIVIAL MR, MILD PR, MILD TR    NO VALVULAR STENOSIS    POOR SOUND TRANSMISSION; DEFINITY CONTRAST USED.    IRREGULAR RHYTHM THROUGHOUT EXAM.   Myoview  08/01/22: Study Highlights      The study is normal. The study is low risk.  Atrial fibrillation present.   No ST deviation was noted.   LV perfusion is normal. There is no evidence of ischemia. There is no evidence of infarction.   Left ventricular function is abnormal. Global function is mildly reduced. Nuclear stress EF: 45 %. The left ventricular ejection fraction is mildly decreased (45-54%). End diastolic cavity size is normal.  There is post CABG septal wall hypokinesis     ASSESSMENT AND PLAN:  CAD:  S/p CABG in 2001. Normal perfusion by Myoview  in May 2024.  He is asymptomatic. Continue medical therapy Atrial fibrillation/Flutter:   s/p atrial flutter ablation April 2017.  Recurrent paroxysmal atrial fib/ flutter noted on monitor at [facility] in 2017 with low burden. Recurrent now and persistent.  He is completely asymptomatic. On Xarelto . Rate is well controlled. At this point would continue rate control strategy. He is also on Plavix for his PAD. If he were to have any bleeding I would have a low threshold for recommending a Watchman device.  Hypertension:  Controlled. Hyperlipidemia:  LDL 60 with addition of Zetia . On maximal lipitor dose.   S/p lumbar fusion with persistent limiting back pain and neuropathy.  Now seeing Dr Jolene Nearing at [facility]. PAD with right SFA and profunda stenosis. S/p fem fem bypass. Improved leg pain.  AAA 4.7 cm. Also followed at [facility].  Severe  sleep apnea. Now on CPAP.  follow up with sleep  medicine. OSA good compliance with CPAP  Follow up in 6 months.   Signed, Gresia Isidoro [place] MD, FACC    07/16/2023 4:27 PM

## 2023-07-16 ENCOUNTER — Encounter: Payer: Self-pay | Admitting: Cardiology

## 2023-07-16 ENCOUNTER — Ambulatory Visit: Attending: Cardiology | Admitting: Cardiology

## 2023-07-16 VITALS — BP 110/78 | HR 74 | Ht 70.0 in | Wt 268.0 lb

## 2023-07-16 DIAGNOSIS — I7143 Infrarenal abdominal aortic aneurysm, without rupture: Secondary | ICD-10-CM | POA: Diagnosis present

## 2023-07-16 DIAGNOSIS — I10 Essential (primary) hypertension: Secondary | ICD-10-CM | POA: Diagnosis present

## 2023-07-16 DIAGNOSIS — G4733 Obstructive sleep apnea (adult) (pediatric): Secondary | ICD-10-CM | POA: Diagnosis present

## 2023-07-16 DIAGNOSIS — I4819 Other persistent atrial fibrillation: Secondary | ICD-10-CM

## 2023-07-16 DIAGNOSIS — I25708 Atherosclerosis of coronary artery bypass graft(s), unspecified, with other forms of angina pectoris: Secondary | ICD-10-CM

## 2023-07-16 DIAGNOSIS — I739 Peripheral vascular disease, unspecified: Secondary | ICD-10-CM

## 2023-07-16 NOTE — Patient Instructions (Signed)

## 2023-07-16 NOTE — Telephone Encounter (Signed)
 Spoke to patient 5/13 advised letter documenting you are under Dr.Jordan's care and you require the use of a cpap machine at night is done.He will pick up at Dr.Jordan's office visit 5/14.

## 2023-07-30 ENCOUNTER — Telehealth: Payer: Self-pay | Admitting: Cardiology

## 2023-07-30 DIAGNOSIS — I4819 Other persistent atrial fibrillation: Secondary | ICD-10-CM

## 2023-07-30 MED ORDER — RIVAROXABAN 20 MG PO TABS: 20.0000 mg | ORAL_TABLET | Freq: Every day | ORAL | 1 refills | Status: DC

## 2023-07-30 MED ORDER — RIVAROXABAN 20 MG PO TABS
20.0000 mg | ORAL_TABLET | Freq: Every day | ORAL | 1 refills | Status: DC
Start: 2023-07-30 — End: 2023-07-30

## 2023-07-30 NOTE — Telephone Encounter (Signed)
*  STAT* If patient is at the pharmacy, call can be transferred to refill team.   1. Which medications need to be refilled? (please list name of each medication and dose if known)   XARELTO  20 MG TABS tablet    2. Which pharmacy/location (including street and city if local pharmacy) is medication to be sent to? [id number] - [place], [place] - [address] Phone: [phone]  Fax: [phone]     3. Do they need a 30 day or 90 day supply? 90

## 2023-07-30 NOTE — Telephone Encounter (Signed)
*  STAT* If patient is at the pharmacy, call can be transferred to refill team.   1. Which medications need to be refilled? (please list name of each medication and dose if known)   rivaroxaban  (XARELTO ) 20 MG TABS tablet   2. Would you like to learn more about the convenience, safety, & potential cost savings by using the [facility]?   3. Are you open to using the [facility] (Type [facility]. ).  4. Which pharmacy/location (including street and city if local pharmacy) is medication to be sent to?  [facility] - [place], [place] - [address]   5. Do they need a 30 day or 90 day supply?   90 day  Patient stated he wants this refill sent to [facility] - [place], [place] - [address] instead of [address].  Patient stated he still has some medication.

## 2023-07-30 NOTE — Telephone Encounter (Signed)
 Prescription refill request for Xarelto  received.  Indication: Afluter Last office visit: 07/16/23 ([place])  Weight: 121.6kg Age: 73 Scr:0.9 (05/29/23)  CrCl: 123.46ml/min  Appropriate dose. Refill sent.

## 2023-07-30 NOTE — Telephone Encounter (Signed)
 Refill sent to requested pharmacy. Called pt and made him aware refill has been sent.

## 2023-07-30 NOTE — Addendum Note (Signed)
 Addended by: Natividad Balding B on: 07/30/2023 10:57 AM   Modules accepted: Orders

## 2023-09-24 ENCOUNTER — Other Ambulatory Visit: Payer: Self-pay | Admitting: Cardiology

## 2023-12-16 ENCOUNTER — Other Ambulatory Visit: Payer: Self-pay | Admitting: Cardiology

## 2023-12-31 ENCOUNTER — Telehealth: Payer: Self-pay | Admitting: Cardiology

## 2023-12-31 DIAGNOSIS — I4819 Other persistent atrial fibrillation: Secondary | ICD-10-CM

## 2023-12-31 MED ORDER — RIVAROXABAN 20 MG PO TABS: 20.0000 mg | ORAL_TABLET | Freq: Every day | ORAL | 1 refills | Status: DC

## 2023-12-31 NOTE — Telephone Encounter (Signed)
 Prescription refill request for Xarelto  received.  Indication:  A-Fib Last office visit:  07/16/2023 Weight:  268 lb. Age:  73 yrs. Scr:  0.8  last lab 12/15/2023 in [facility]where in hospital at [facility] CrCl: 106 mL/min  Pt meets protocol and pt medication was sent to pt's pharmacy for 6 mos.

## 2023-12-31 NOTE — Telephone Encounter (Signed)
*  STAT* If patient is at the pharmacy, call can be transferred to refill team.   1. Which medications need to be refilled? (please list name of each medication and dose if known)   rivaroxaban  (XARELTO ) 20 MG TABS tablet    2. Which pharmacy/location (including street and city if local pharmacy) is medication to be sent to? [facility] #[id number] - [place], [place] - [address]   3. Do they need a 30 day or 90 day supply? 90

## 2024-01-01 MED ORDER — RIVAROXABAN 20 MG PO TABS: 20.0000 mg | ORAL_TABLET | Freq: Every day | ORAL | 1 refills | Status: AC

## 2024-01-01 NOTE — Telephone Encounter (Signed)
 Pt states Xarelto  was sent to the wrong pharmacy.   Pt c/o medication issue:  1. Name of Medication: rivaroxaban  (XARELTO ) 20 MG TABS tablet   2. How are you currently taking this medication (dosage and times per day)? Take 1 tablet (20 mg total) by mouth daily   3. Are you having a reaction (difficulty breathing--STAT)? No   4. What is your medication issue? Xarelto  sent to wrong pharmacy. Pt wanted it sent to [facility] - [place], [place] - [address]  instead of [facility]. Please advise.    *STAT* If patient is at the pharmacy, call can be transferred to refill team.   1. Which medications need to be refilled? (please list name of each medication and dose if known)   rivaroxaban  (XARELTO ) 20 MG TABS tablet     2. Would you like to learn more about the convenience, safety, & potential cost savings by using the [facility]? No   3. Are you open to using the [facility] (Type [facility]. No    4. Which pharmacy/location (including street and city if local pharmacy) is medication to be sent to? [facility] - [place], [place] - [address]     5. Do they need a 30 day or 90 day supply? 90 day

## 2024-01-01 NOTE — Telephone Encounter (Signed)
 This has been sent to local pharmacy will send to [facility].

## 2024-01-01 NOTE — Addendum Note (Signed)
 Addended by: DRENA MARTINIS, Jaran Sainz L on: 01/01/2024 04:08 PM   Modules accepted: Orders

## 2024-01-02 ENCOUNTER — Other Ambulatory Visit: Payer: Self-pay | Admitting: Cardiology

## 2024-01-02 DIAGNOSIS — I25708 Atherosclerosis of coronary artery bypass graft(s), unspecified, with other forms of angina pectoris: Secondary | ICD-10-CM

## 2024-01-07 NOTE — Progress Notes (Signed)
 Cardiology Office Note    Date:  01/12/2024   ID:  William William [name], DOB Dec 07, 1950, MRN [id number]  PCP:  William Coye DELENA, MD  Cardiologist:  Dr. Jayjay William [name]     History of Present Illness: William William [name] is a 73 y.o. male seen for follow up CAD and atrial fibrillation.  He has a history of CAD, s/p CABG in 03/1999 (LIMA-LAD, RIMA-OM2, SVG-OM1/OM3, SVG-AM of the RCA), HTN, HL, atrial fibrillation, PMR.   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  He converted to NSR on his own and Eliquis  was d/c'd.  He was seen at the [facility] clinic in November 2016 and was noted to have an irregular pulse. He was  in atrial flutter. He denied any symptoms. He was  started on Xarelto . Metoprolol  dose was increased for rate control. Echo showed mild LVH with normal systolic function. EF 55-60%. Mild RAE.   He underwent DCCV in Jan 2017. On follow up in Feb. He was back in atrial flutter. He subsequently underwent ablation of atrial flutter by Dr. Inocencio on 06/15/15. No complications. His anticoagulation was stopped one month later. When he was seen  in May 2017 he was in NSR.  A Holter was done at the [facility] later in May 2017 and apparently showed  paroxysmal atrial fibrillation and flutter. Burden 4%.  CHAD vasc score of 3. It was recommended that he resume Xarelto .   He was admitted at [facility] in January 2022 with right PNA and AKI. Echo at that time was OK. Apparently had a hematoma of the right flank as well. Xarelto  was held but later resumed. He is off methotrexate .   When seen by Dr William in August his pulse was irregular but controlled. He still has  persistent limiting back pain and neuropathy.  Now seeing Dr William William [name] at [facility]. Had a couple of injections and is being considered for ablative therapy. He is followed by Dr William William [name] at [facility] vascular for AAA and also PAD- LE dopplers indicated right SFA and AT disease. Medical therapy recommended.  Now has a motorized chair  or uses a William [name].  He did have William [name] sleep study demonstrating severe OSA. Now on CPAP.   Was seen by vascular surgery- Dr William William [name] at [facility].  CT showed AAA stable at 4.3 cm. Severe stenosis of right profunda and SFA. He had right femoral endarterectomy and SFA angioplasty. Had right to left fem- fem bypass in July 2024.  Myoview  study in May 2024 showed normal perfusion with mildly reduced EF 45%.    Is now on CPAP for severe sleep apnea and states this has made a huge difference.  Uses nightly. No chest pain.  Recent CT showed AAA was 5.5 cm. Also significant obstruction in right SFA stent. Plans to have PTA and stent grafting early next year. Has been on Zepbound with 40 lb weight loss. Has been riding his bike- 800 miles since May. Still has problems with weight bearing due to arthritis but able to ride bike.    Recent Labs: No results found for requested labs within last 365 days.  Wt Readings from Last 3 Encounters:  01/12/24 230 lb (104.3 kg)  07/16/23 268 lb (121.6 kg)  01/15/23 269 lb 3.2 oz (122.1 kg)     Past Medical History:  Diagnosis Date   AAA (abdominal aortic aneurysm) without rupture 08/22/2015   Abdominal mass, right lower quadrant, fatty 08/22/2015   Atrial flutter (HCC)  09/19/2012   CMV (cytomegalovirus infection) (HCC)    Coronary disease 2001   CABG   Cytomegalovirus mononucleosis 09/17/2012   Hypercholesterolemia    Hypertension    Insomnia    Myocardial infarction (HCC)    Pneumonia    Polymyalgia rheumatica    Tobacco abuse     Current Outpatient Medications  Medication Sig Dispense Refill   ACETAMINOPHEN  ER PO Take 500 mg by mouth every 8 (eight) hours as needed for pain. Take 2 tablets as needed     Adalimumab (HUMIRA PEN) 40 MG/0.4ML PNKT ADMINISTER 1 INJECTION UNDER THE SKIN EVERY OTHER WEEK     albuterol  (PROVENTIL  HFA;VENTOLIN  HFA) 108 (90 BASE) MCG/ACT inhaler Inhale 1-2 puffs into the lungs every 6 (six) hours as needed for wheezing or shortness of  breath. 1 Inhaler 0   amLODipine  (NORVASC ) 10 MG tablet Take 10 mg by mouth daily.     atorvastatin  (LIPITOR) 80 MG tablet TAKE 1 TABLET(80 MG) BY MOUTH DAILY 90 tablet 3   clopidogrel (PLAVIX) 75 MG tablet Take 75 mg by mouth daily.     denosumab (PROLIA) 60 MG/ML SOSY injection Inject 60 mg into the skin every 6 (six) months.     ezetimibe  (ZETIA ) 10 MG tablet TAKE 1 TABLET(10 MG) BY MOUTH DAILY 90 tablet 2   lisinopril  (ZESTRIL ) 40 MG tablet TAKE 1 TABLET(40 MG) BY MOUTH DAILY 90 tablet 0   magnesium  gluconate (MAGONATE) 500 MG tablet Take 500 mg by mouth every evening.      magnesium  oxide (MAG-OX) 400 MG tablet Take 400 mg by mouth daily.     metoprolol  tartrate (LOPRESSOR ) 50 MG tablet Take 1 tablet (50 mg total) by mouth 2 (two) times daily. 180 tablet 3   Multiple Vitamin (MULTIVITAMIN WITH MINERALS) TABS tablet Take 1 tablet by mouth daily.     nitroGLYCERIN  (NITROSTAT ) 0.4 MG SL tablet PLACE 1 TABLET UNDER THE TOUGUE EVERY 5 MINUTES AS NEEDED FOR CHEST PAIN 75 tablet 1   Omega-3 Fatty Acids (FISH OIL) 500 MG CAPS Take 2 capsules by mouth 2 (two) times daily.     rivaroxaban  (XARELTO ) 20 MG TABS tablet Take 1 tablet (20 mg total) by mouth daily. 90 tablet 1   Saccharomyces boulardii (PROBIOTIC) 250 MG CAPS Take by mouth. 60 capsule    sildenafil (VIAGRA) 100 MG tablet Take 100 mg by mouth as needed.     traMADol  (ULTRAM ) 50 MG tablet Take 50 mg by mouth every 6 (six) hours as needed for moderate pain or severe pain. 30 tablet    vitamin C (ASCORBIC ACID) 250 MG tablet Take 500 mg daily     ZEPBOUND 12.5 MG/0.5ML Pen once a week.     No current facility-administered medications for this visit.    Allergies:   Methotrexate , Oxycodone , Iodinated contrast media, Iodine, and Zocor [simvastatin]   Social History:  The patient  reports that he quit smoking about 2 years ago. His smoking use included cigars. He has never used smokeless tobacco. He reports current alcohol use of about 1.0  standard drink of alcohol per week. He reports that he does not use drugs.   Family History:  The patient's family history includes Bladder Cancer in his father; Colon cancer in his mother; Heart failure in his father; Pancreatic cancer in his mother.   ROS:  Please see the history of present illness.   All other systems reviewed and negative.   PHYSICAL EXAM: VS:  BP 105/70   Pulse  64   Ht 5' 10 (1.778 m)   Wt 230 lb (104.3 kg)   SpO2 95%   BMI 33.00 kg/m  GENERAL:  Well appearing morbidly obese WM in NAD HEENT:  PERRL, EOMI, sclera are clear. Oropharynx is clear. NECK:  No jugular venous distention, carotid upstroke brisk and symmetric, no bruits, no thyromegaly or adenopathy LUNGS:  clear  CHEST:  Unremarkable HEART:  IRRR,  PMI not displaced or sustained,S1 and S2 within normal limits, no S3, no S4: no clicks, no rubs, no murmurs ABD:  Soft, nontender. BS +, no masses or bruits. No hepatomegaly, no splenomegaly EXT:  1+ RLE pulse. 2+ on left  no edema, no cyanosis no clubbing SKIN:  Warm and dry.  No rashes NEURO:  Alert and oriented x 3. Cranial nerves II through XII intact. PSYCH:  Cognitively intact       Lab Results  Component Value Date   WBC 3.7 (L) 08/21/2015   HGB 15.8 08/21/2015   HCT 47.6 08/21/2015   PLT 70 (L) 08/21/2015   GLUCOSE 95 07/05/2022   CHOL 130 07/05/2022   TRIG 109 07/05/2022   HDL 36 (L) 07/05/2022   LDLCALC 74 07/05/2022   ALT 28 07/05/2022   AST 22 07/05/2022   NA 139 07/05/2022   K 5.1 07/05/2022   CL 104 07/05/2022   CREATININE 0.83 07/05/2022   BUN 18 07/05/2022   CO2 22 07/05/2022   TSH 0.951 09/22/2012   INR 2.03 (H) 03/23/2015   Dated 02/16/16: Normal CMET, glucose 106. Cholesterol 187, triglycerides 85, HDL 47, LDL 123.  Dated 07/25/16: Normal Hgb, creatinine, ALT.  Dated 02/07/17: WBC 9.9K, otherwise CBC normal. A1c 5.6%. CMET normal. Cholesterol 145, triglycerides 188, HDL 45, LDL 62.  Dated 11/13/17: cholesterol 134,  triglycerides 174, HDL 36, LDL 63. TSH normal Dated 01/23/18: BMET and CBC normal. Labs from the [facility] 02/10/18: Normal CBC and CMET. Cholesterol 154, triglycerides 226, HDL 41, LDL 68.  Dated 05/08/20: normal CBC and CMET Dated 06/09/19; cholesterol 106, triglycerides 132, HLD 30, LDL 52.  Dated 12/29/20: CBC, BMET and LFTs normal Dated 06/29/21: normal CBC and CMET Dated 01/01/22: normal CMET Dated 05/09/22: normal BMET and CBC.  Dated 04/29/23: cholesterol 124, triglycerides 103, HDL 45, LDL 60  EKG Interpretation Date/Time:  Monday January 12 2024 09:34:52 EST Ventricular Rate:  64 PR Interval:    QRS Duration:  94 QT Interval:  376 QTC Calculation: 387 R Axis:   65  Text Interpretation: Atrial fibrillation When compared with ECG of 15-Jan-2023 08:51, No significant change was found Confirmed by Darcella Shiffman [id number] on 01/12/2024 9:39:35 AM    Myoview  08/08/15:Study Highlights   Nuclear stress EF: 54%. Visually, the LV function appears to be greater than the computer generated 54%. Normal LV functino There was no ST segment deviation noted during stress. The study is normal. there is no ischemia or infarction This is a low risk study   Echo 04/03/20: Impression  Left Ventricle: EF: 60-65%.  Narrative  This result has an attachment that is not available.    Left Ventricle  Normal left ventricular size. There is mild hypertrophy. EF: 60-65%. Wall motion is normal. Doppler parameters indicate normal diastolic function.   Right Ventricle  Right ventricle is normal. Systolic function is normal.   Left Atrium  Left atrium is mildly dilated.   Right Atrium  Right atrium is mildly dilated.   Mitral Valve  There is moderate annular calcification. There is trace regurgitation.  Tricuspid Valve  Tricuspid valve structure is normal. There is trace regurgitation. The right ventricular systolic pressure is normal (<36 mmHg).   Aortic Valve  The aortic valve is tricuspid. The  leaflets exhibit normal excursion. There is moderate sclerosis. There is no regurgitation or stenosis.   Pulmonic Valve  The pulmonic valve was not well visualized. Trace regurgitation.   Ascending Aorta  The aortic root is normal in size.   Pericardium  There is no pericardial effusion.   Study Details  A complete echo was performed using complete 2D, color flow Doppler and spectral Doppler. Overall the study quality was adequate. The study wastechnically difficult.   Echo 10/08/21: INTERPRETATION ---------------------------------------------------------------    NORMAL LEFT VENTRICULAR SYSTOLIC FUNCTION WITH MILD LVH    NORMAL RIGHT VENTRICULAR SYSTOLIC FUNCTION    VALVULAR REGURGITATION: TRIVIAL MR, MILD PR, MILD TR    NO VALVULAR STENOSIS    POOR SOUND TRANSMISSION; DEFINITY CONTRAST USED.    IRREGULAR RHYTHM THROUGHOUT EXAM.   Myoview  08/01/22: Study Highlights      The study is normal. The study is low risk.  Atrial fibrillation present.   No ST deviation was noted.   LV perfusion is normal. There is no evidence of ischemia. There is no evidence of infarction.   Left ventricular function is abnormal. Global function is mildly reduced. Nuclear stress EF: 45 %. The left ventricular ejection fraction is mildly decreased (45-54%). End diastolic cavity size is normal.  There is post CABG septal wall hypokinesis     ASSESSMENT AND PLAN:  CAD:  S/p CABG in 2001. Normal perfusion by Myoview  in May 2024.  He is asymptomatic. Continue medical therapy. He is clear for AAA stent graft from my standpoint.  Atrial fibrillation/Flutter:   s/p atrial flutter ablation April 2017.  Recurrent paroxysmal atrial fib/ flutter noted on monitor at [facility] in 2017 with low burden. Recurrent now and persistent.  He is completely asymptomatic. On Xarelto . Rate is well controlled. At this point would continue rate control strategy. He is also on Plavix for his PAD. If he were to have any bleeding I would have a  low threshold for recommending a Watchman device.  Hypertension:  well Controlled. Hyperlipidemia:  LDL 60 with addition of Zetia . On maximal lipitor dose.   S/p lumbar fusion with persistent limiting back pain and neuropathy.  Now seeing Dr William William [name] at [facility]. PAD with right SFA and profunda stenosis. S/p fem fem bypass. Recurrent SFA stenosis.  AAA 5.5 cm. Also followed at [facility]. Plan stent graft in next few months.  Severe  sleep apnea. Now on CPAP.  follow up with sleep medicine. OSA good compliance with CPAP Obesity. Significant weight loss with Zepbound  Follow up in 6 months.   Signed, Trayvond Viets MD, [facility]    01/12/2024 9:56 AM

## 2024-01-12 ENCOUNTER — Ambulatory Visit: Attending: Cardiology | Admitting: Cardiology

## 2024-01-12 ENCOUNTER — Encounter: Payer: Self-pay | Admitting: Cardiology

## 2024-01-12 VITALS — BP 105/70 | HR 64 | Ht 70.0 in | Wt 230.0 lb

## 2024-01-12 DIAGNOSIS — I4819 Other persistent atrial fibrillation: Secondary | ICD-10-CM | POA: Insufficient documentation

## 2024-01-12 DIAGNOSIS — I7143 Infrarenal abdominal aortic aneurysm, without rupture: Secondary | ICD-10-CM | POA: Diagnosis not present

## 2024-01-12 DIAGNOSIS — I25708 Atherosclerosis of coronary artery bypass graft(s), unspecified, with other forms of angina pectoris: Secondary | ICD-10-CM | POA: Insufficient documentation

## 2024-01-12 DIAGNOSIS — G4733 Obstructive sleep apnea (adult) (pediatric): Secondary | ICD-10-CM | POA: Diagnosis present

## 2024-01-12 DIAGNOSIS — I739 Peripheral vascular disease, unspecified: Secondary | ICD-10-CM | POA: Diagnosis present

## 2024-01-12 DIAGNOSIS — I10 Essential (primary) hypertension: Secondary | ICD-10-CM | POA: Insufficient documentation

## 2024-01-12 NOTE — Patient Instructions (Addendum)
 Medication Instructions:  Continue same medications *If you need a refill on your cardiac medications before your next appointment, please call your pharmacy*  Lab Work: None ordered  Testing/Procedures: None ordered  Follow-Up: At [facility], you and your health needs are our priority.  As part of our continuing mission to provide you with exceptional heart care, our providers are all part of one team.  This team includes your primary Cardiologist (physician) and Advanced Practice Providers or APPs (Physician Assistants and Nurse Practitioners) who all work together to provide you with the care you need, when you need it.  Your next appointment:  6 months    Call in Feb to schedule May appointment     Provider:  Dr.Jordan   We recommend signing up for the patient portal called MyChart.  Sign up information is provided on this After Visit Summary.  MyChart is used to connect with patients for Virtual Visits (Telemedicine).  Patients are able to view lab/test results, encounter notes, upcoming appointments, etc.  Non-urgent messages can be sent to your provider as well.   To learn more about what you can do with MyChart, go to [email or web address].

## 2024-02-07 ENCOUNTER — Other Ambulatory Visit: Payer: Self-pay | Admitting: Cardiology

## 2024-03-09 ENCOUNTER — Other Ambulatory Visit: Payer: Self-pay | Admitting: Cardiology
